# Patient Record
Sex: Female | Born: 1955 | Hispanic: No | Marital: Single | Smoking: Never smoker
Health system: Southern US, Community
[De-identification: ages and names within clinical notes are randomized; demographics above are authoritative.]

## PROBLEM LIST (undated history)

## (undated) DIAGNOSIS — E785 Hyperlipidemia, unspecified: Secondary | ICD-10-CM

## (undated) DIAGNOSIS — F419 Anxiety disorder, unspecified: Secondary | ICD-10-CM

## (undated) DIAGNOSIS — C4491 Basal cell carcinoma of skin, unspecified: Secondary | ICD-10-CM

## (undated) DIAGNOSIS — I10 Essential (primary) hypertension: Secondary | ICD-10-CM

## (undated) DIAGNOSIS — N6019 Diffuse cystic mastopathy of unspecified breast: Secondary | ICD-10-CM

## (undated) DIAGNOSIS — R011 Cardiac murmur, unspecified: Secondary | ICD-10-CM

## (undated) DIAGNOSIS — K219 Gastro-esophageal reflux disease without esophagitis: Secondary | ICD-10-CM

## (undated) HISTORY — PX: INGUINAL HERNIA REPAIR: SUR1180

## (undated) HISTORY — DX: Anxiety disorder, unspecified: F41.9

## (undated) HISTORY — PX: COLONOSCOPY: SHX174

## (undated) HISTORY — DX: Essential (primary) hypertension: I10

## (undated) HISTORY — PX: TONSILLECTOMY: SUR1361

## (undated) HISTORY — DX: Hemochromatosis, unspecified: E83.119

## (undated) HISTORY — DX: Gilbert syndrome: E80.4

## (undated) HISTORY — DX: Diffuse cystic mastopathy of unspecified breast: N60.19

## (undated) HISTORY — DX: Hyperlipidemia, unspecified: E78.5

## (undated) HISTORY — PX: DILATION AND CURETTAGE OF UTERUS: SHX78

## (undated) HISTORY — DX: Basal cell carcinoma of skin, unspecified: C44.91

## (undated) HISTORY — DX: Cardiac murmur, unspecified: R01.1

## (undated) HISTORY — PX: BREAST BIOPSY: SHX20

## (undated) HISTORY — PX: WISDOM TOOTH EXTRACTION: SHX21

## (undated) HISTORY — DX: Gastro-esophageal reflux disease without esophagitis: K21.9

---

## 2001-06-05 ENCOUNTER — Encounter: Payer: Self-pay | Admitting: Family Medicine

## 2001-06-05 ENCOUNTER — Encounter: Admission: RE | Admit: 2001-06-05 | Discharge: 2001-06-05 | Payer: Self-pay | Admitting: Family Medicine

## 2001-06-19 ENCOUNTER — Encounter: Admission: RE | Admit: 2001-06-19 | Discharge: 2001-06-19 | Payer: Self-pay | Admitting: *Deleted

## 2001-07-19 HISTORY — PX: OTHER SURGICAL HISTORY: SHX169

## 2004-09-04 ENCOUNTER — Ambulatory Visit: Payer: Self-pay | Admitting: Sports Medicine

## 2005-05-19 ENCOUNTER — Ambulatory Visit: Payer: Self-pay | Admitting: Internal Medicine

## 2005-05-27 ENCOUNTER — Ambulatory Visit: Payer: Self-pay | Admitting: Internal Medicine

## 2006-05-20 ENCOUNTER — Ambulatory Visit: Payer: Self-pay | Admitting: Internal Medicine

## 2006-05-20 LAB — CONVERTED CEMR LAB
ALT: 12 units/L (ref 0–40)
AST: 18 units/L (ref 0–37)
Basophils Absolute: 0 10*3/uL (ref 0.0–0.1)
Basophils Relative: 0.2 % (ref 0.0–1.0)
Chol/HDL Ratio, serum: 3.8
Cholesterol: 197 mg/dL (ref 0–200)
Eosinophil percent: 1.4 % (ref 0.0–5.0)
Glucose, Bld: 101 mg/dL — ABNORMAL HIGH (ref 70–99)
HCT: 45.7 % (ref 36.0–46.0)
HDL: 51.6 mg/dL (ref >39.0)
Hemoglobin: 15.6 g/dL — ABNORMAL HIGH (ref 12.0–15.0)
Hgb A1c MFr Bld: 5.4 % (ref 4.6–6.0)
LDL Cholesterol: 136 mg/dL — ABNORMAL HIGH (ref 0–99)
Lymphocytes Relative: 28.7 % (ref 12.0–46.0)
MCHC: 34.2 g/dL (ref 30.0–36.0)
MCV: 94.9 fL (ref 78.0–100.0)
Monocytes Absolute: 0.6 10*3/uL (ref 0.2–0.7)
Monocytes Relative: 9 % (ref 3.0–11.0)
Neutro Abs: 3.8 10*3/uL (ref 1.4–7.7)
Neutrophils Relative %: 60.7 % (ref 43.0–77.0)
Platelets: 295 10*3/uL (ref 150–400)
RBC: 4.82 M/uL (ref 3.87–5.11)
RDW: 11.5 % (ref 11.5–14.6)
TSH: 2.84 microintl units/mL (ref 0.35–5.50)
Triglyceride fasting, serum: 49 mg/dL (ref 0–149)
VLDL: 10 mg/dL (ref 0–40)
WBC: 6.3 10*3/uL (ref 4.5–10.5)

## 2006-06-01 ENCOUNTER — Ambulatory Visit: Payer: Self-pay | Admitting: Internal Medicine

## 2006-10-14 ENCOUNTER — Ambulatory Visit: Payer: Self-pay | Admitting: Internal Medicine

## 2006-10-14 LAB — CONVERTED CEMR LAB
ALT: 15 U/L (ref 0–40)
AST: 19 U/L (ref 0–37)
Cholesterol: 158 mg/dL (ref 0–200)
HDL: 54.8 mg/dL (ref >39.0)
LDL Cholesterol: 94 mg/dL (ref 0–99)
Total CHOL/HDL Ratio: 2.9
Triglycerides: 44 mg/dL (ref 0–149)
VLDL: 9 mg/dL (ref 0–40)

## 2006-11-14 ENCOUNTER — Ambulatory Visit: Payer: Self-pay | Admitting: Internal Medicine

## 2008-08-01 ENCOUNTER — Ambulatory Visit: Payer: Self-pay | Admitting: Internal Medicine

## 2008-08-01 DIAGNOSIS — N951 Menopausal and female climacteric states: Secondary | ICD-10-CM | POA: Insufficient documentation

## 2008-08-01 DIAGNOSIS — E785 Hyperlipidemia, unspecified: Secondary | ICD-10-CM

## 2008-08-01 DIAGNOSIS — Z87898 Personal history of other specified conditions: Secondary | ICD-10-CM | POA: Insufficient documentation

## 2008-08-01 DIAGNOSIS — Z9889 Other specified postprocedural states: Secondary | ICD-10-CM | POA: Insufficient documentation

## 2008-08-01 DIAGNOSIS — Z9089 Acquired absence of other organs: Secondary | ICD-10-CM | POA: Insufficient documentation

## 2008-08-09 ENCOUNTER — Encounter: Admission: RE | Admit: 2008-08-09 | Discharge: 2008-08-09 | Payer: Self-pay | Admitting: Internal Medicine

## 2008-08-13 ENCOUNTER — Telehealth (INDEPENDENT_AMBULATORY_CARE_PROVIDER_SITE_OTHER): Payer: Self-pay | Admitting: *Deleted

## 2008-08-14 ENCOUNTER — Encounter (INDEPENDENT_AMBULATORY_CARE_PROVIDER_SITE_OTHER): Payer: Self-pay | Admitting: *Deleted

## 2008-08-28 ENCOUNTER — Ambulatory Visit: Payer: Self-pay | Admitting: Internal Medicine

## 2008-08-28 LAB — CONVERTED CEMR LAB
OCCULT 1: NEGATIVE
OCCULT 2: NEGATIVE
OCCULT 3: NEGATIVE

## 2008-08-29 ENCOUNTER — Encounter (INDEPENDENT_AMBULATORY_CARE_PROVIDER_SITE_OTHER): Payer: Self-pay | Admitting: *Deleted

## 2008-09-19 ENCOUNTER — Ambulatory Visit: Payer: Self-pay | Admitting: Internal Medicine

## 2008-09-19 DIAGNOSIS — J309 Allergic rhinitis, unspecified: Secondary | ICD-10-CM | POA: Insufficient documentation

## 2008-10-29 ENCOUNTER — Ambulatory Visit: Payer: Self-pay | Admitting: Internal Medicine

## 2008-10-30 ENCOUNTER — Ambulatory Visit: Payer: Self-pay | Admitting: Obstetrics and Gynecology

## 2008-10-30 ENCOUNTER — Encounter: Payer: Self-pay | Admitting: Obstetrics and Gynecology

## 2008-10-30 ENCOUNTER — Other Ambulatory Visit: Admission: RE | Admit: 2008-10-30 | Discharge: 2008-10-30 | Payer: Self-pay | Admitting: Obstetrics and Gynecology

## 2008-11-06 LAB — CONVERTED CEMR LAB
Alkaline Phosphatase: 60 units/L (ref 39–117)
Bilirubin, Direct: 0.1 mg/dL (ref 0.0–0.3)
Cholesterol: 186 mg/dL (ref 0–200)
LDL Cholesterol: 118 mg/dL — ABNORMAL HIGH (ref 0–99)
Total Bilirubin: 1.1 mg/dL (ref 0.3–1.2)
Total CHOL/HDL Ratio: 3
Total Protein: 6.8 g/dL (ref 6.0–8.3)
VLDL: 8 mg/dL (ref 0.0–40.0)

## 2008-11-07 ENCOUNTER — Telehealth (INDEPENDENT_AMBULATORY_CARE_PROVIDER_SITE_OTHER): Payer: Self-pay | Admitting: *Deleted

## 2008-11-07 ENCOUNTER — Encounter (INDEPENDENT_AMBULATORY_CARE_PROVIDER_SITE_OTHER): Payer: Self-pay | Admitting: *Deleted

## 2008-11-19 ENCOUNTER — Ambulatory Visit: Payer: Self-pay | Admitting: Internal Medicine

## 2008-11-19 DIAGNOSIS — M5030 Other cervical disc degeneration, unspecified cervical region: Secondary | ICD-10-CM

## 2008-11-19 LAB — CONVERTED CEMR LAB
HDL goal, serum: 50 mg/dL
LDL Goal: 100 mg/dL

## 2008-11-25 ENCOUNTER — Encounter: Payer: Self-pay | Admitting: Internal Medicine

## 2008-11-25 ENCOUNTER — Ambulatory Visit: Payer: Self-pay | Admitting: Family Medicine

## 2008-12-17 ENCOUNTER — Encounter (INDEPENDENT_AMBULATORY_CARE_PROVIDER_SITE_OTHER): Payer: Self-pay | Admitting: *Deleted

## 2008-12-25 ENCOUNTER — Ambulatory Visit: Payer: Self-pay | Admitting: Internal Medicine

## 2008-12-25 DIAGNOSIS — F411 Generalized anxiety disorder: Secondary | ICD-10-CM | POA: Insufficient documentation

## 2008-12-26 ENCOUNTER — Telehealth (INDEPENDENT_AMBULATORY_CARE_PROVIDER_SITE_OTHER): Payer: Self-pay | Admitting: *Deleted

## 2008-12-27 ENCOUNTER — Telehealth (INDEPENDENT_AMBULATORY_CARE_PROVIDER_SITE_OTHER): Payer: Self-pay | Admitting: *Deleted

## 2008-12-30 ENCOUNTER — Telehealth (INDEPENDENT_AMBULATORY_CARE_PROVIDER_SITE_OTHER): Payer: Self-pay | Admitting: *Deleted

## 2009-01-10 ENCOUNTER — Telehealth (INDEPENDENT_AMBULATORY_CARE_PROVIDER_SITE_OTHER): Payer: Self-pay | Admitting: *Deleted

## 2009-07-19 HISTORY — PX: OTHER SURGICAL HISTORY: SHX169

## 2009-08-28 ENCOUNTER — Encounter: Admission: RE | Admit: 2009-08-28 | Discharge: 2009-08-28 | Payer: Self-pay | Admitting: Obstetrics and Gynecology

## 2010-02-10 ENCOUNTER — Encounter (INDEPENDENT_AMBULATORY_CARE_PROVIDER_SITE_OTHER): Payer: Self-pay | Admitting: *Deleted

## 2010-02-10 ENCOUNTER — Ambulatory Visit: Payer: Self-pay | Admitting: Gastroenterology

## 2010-02-10 DIAGNOSIS — K589 Irritable bowel syndrome without diarrhea: Secondary | ICD-10-CM

## 2010-02-10 LAB — CONVERTED CEMR LAB: Tissue Transglutaminase Ab, IgA: 10 units (ref <20)

## 2010-02-11 ENCOUNTER — Ambulatory Visit: Payer: Self-pay | Admitting: Internal Medicine

## 2010-02-11 DIAGNOSIS — M5430 Sciatica, unspecified side: Secondary | ICD-10-CM | POA: Insufficient documentation

## 2010-02-11 DIAGNOSIS — R198 Other specified symptoms and signs involving the digestive system and abdomen: Secondary | ICD-10-CM | POA: Insufficient documentation

## 2010-02-12 ENCOUNTER — Telehealth (INDEPENDENT_AMBULATORY_CARE_PROVIDER_SITE_OTHER): Payer: Self-pay | Admitting: *Deleted

## 2010-02-13 DIAGNOSIS — R7989 Other specified abnormal findings of blood chemistry: Secondary | ICD-10-CM | POA: Insufficient documentation

## 2010-02-13 LAB — CONVERTED CEMR LAB
ALT: 15 units/L (ref 0–35)
Albumin: 4.6 g/dL (ref 3.5–5.2)
Alkaline Phosphatase: 70 units/L (ref 39–117)
Basophils Absolute: 0 10*3/uL (ref 0.0–0.1)
Bilirubin, Direct: 0.2 mg/dL (ref 0.0–0.3)
CO2: 31 meq/L (ref 19–32)
Calcium: 9.6 mg/dL (ref 8.4–10.5)
Chloride: 104 meq/L (ref 96–112)
Creatinine, Ser: 0.7 mg/dL (ref 0.4–1.2)
Eosinophils Relative: 1 % (ref 0.0–5.0)
Glucose, Bld: 103 mg/dL — ABNORMAL HIGH (ref 70–99)
HCT: 43.6 % (ref 36.0–46.0)
Hemoglobin: 15.4 g/dL — ABNORMAL HIGH (ref 12.0–15.0)
IgA: 171 mg/dL (ref 68–378)
Lipase: 15 units/L (ref 11.0–59.0)
Lymphocytes Relative: 34.4 % (ref 12.0–46.0)
Lymphs Abs: 1.8 10*3/uL (ref 0.7–4.0)
Monocytes Relative: 9.2 % (ref 3.0–12.0)
Neutro Abs: 2.9 10*3/uL (ref 1.4–7.7)
RDW: 11.7 % (ref 11.5–14.6)
Saturation Ratios: 68.8 % — ABNORMAL HIGH (ref 20.0–50.0)
Sed Rate: 5 mm/hr (ref 0–22)
Sodium: 142 meq/L (ref 135–145)
Total Protein: 7.1 g/dL (ref 6.0–8.3)
Transferrin: 207.5 mg/dL — ABNORMAL LOW (ref 212.0–360.0)
WBC: 5.4 10*3/uL (ref 4.5–10.5)

## 2010-02-18 ENCOUNTER — Other Ambulatory Visit: Admission: RE | Admit: 2010-02-18 | Discharge: 2010-02-18 | Payer: Self-pay | Admitting: Obstetrics and Gynecology

## 2010-02-18 ENCOUNTER — Ambulatory Visit: Payer: Self-pay | Admitting: Gastroenterology

## 2010-02-18 ENCOUNTER — Ambulatory Visit: Payer: Self-pay | Admitting: Obstetrics and Gynecology

## 2010-02-19 ENCOUNTER — Telehealth (INDEPENDENT_AMBULATORY_CARE_PROVIDER_SITE_OTHER): Payer: Self-pay | Admitting: *Deleted

## 2010-03-04 ENCOUNTER — Ambulatory Visit: Payer: Self-pay | Admitting: Internal Medicine

## 2010-03-04 DIAGNOSIS — K5289 Other specified noninfective gastroenteritis and colitis: Secondary | ICD-10-CM

## 2010-03-05 ENCOUNTER — Telehealth: Payer: Self-pay | Admitting: Internal Medicine

## 2010-03-16 ENCOUNTER — Ambulatory Visit: Payer: Self-pay | Admitting: Gastroenterology

## 2010-03-18 ENCOUNTER — Encounter: Payer: Self-pay | Admitting: Gastroenterology

## 2010-03-19 ENCOUNTER — Telehealth: Payer: Self-pay | Admitting: Gastroenterology

## 2010-03-20 ENCOUNTER — Ambulatory Visit: Payer: Self-pay | Admitting: Gastroenterology

## 2010-05-20 ENCOUNTER — Ambulatory Visit: Payer: Self-pay | Admitting: Internal Medicine

## 2010-05-20 DIAGNOSIS — R21 Rash and other nonspecific skin eruption: Secondary | ICD-10-CM

## 2010-07-19 HISTORY — PX: MOHS SURGERY: SUR867

## 2010-08-05 ENCOUNTER — Ambulatory Visit
Admission: RE | Admit: 2010-08-05 | Discharge: 2010-08-05 | Payer: Self-pay | Source: Home / Self Care | Attending: Sports Medicine | Admitting: Sports Medicine

## 2010-08-05 DIAGNOSIS — M25579 Pain in unspecified ankle and joints of unspecified foot: Secondary | ICD-10-CM | POA: Insufficient documentation

## 2010-08-09 ENCOUNTER — Encounter: Payer: Self-pay | Admitting: Internal Medicine

## 2010-08-16 LAB — CONVERTED CEMR LAB
ALT: 23 units/L (ref 0–35)
AST: 29 units/L (ref 0–37)
Albumin: 4.6 g/dL (ref 3.5–5.2)
Alkaline Phosphatase: 61 units/L (ref 39–117)
BUN: 14 mg/dL (ref 6–23)
Basophils Absolute: 0 10*3/uL (ref 0.0–0.1)
Basophils Relative: 0.3 % (ref 0.0–3.0)
Bilirubin, Direct: 0.1 mg/dL (ref 0.0–0.3)
CO2: 31 meq/L (ref 19–32)
Calcium: 9.6 mg/dL (ref 8.4–10.5)
Chloride: 103 meq/L (ref 96–112)
Cholesterol, target level: 200 mg/dL
Cholesterol: 215 mg/dL (ref 0–200)
Creatinine, Ser: 0.9 mg/dL (ref 0.4–1.2)
Direct LDL: 131.8 mg/dL
Eosinophils Absolute: 0.1 10*3/uL (ref 0.0–0.7)
Eosinophils Relative: 1.7 % (ref 0.0–5.0)
GFR calc Af Amer: 84 mL/min
GFR calc non Af Amer: 69 mL/min
Glucose, Bld: 101 mg/dL — ABNORMAL HIGH (ref 70–99)
HCT: 44.3 % (ref 36.0–46.0)
HDL goal, serum: 50 mg/dL
HDL: 68 mg/dL (ref >39.0)
Hemoglobin: 15.5 g/dL — ABNORMAL HIGH (ref 12.0–15.0)
LDL Goal: 100 mg/dL
Lymphocytes Relative: 32.7 % (ref 12.0–46.0)
MCHC: 35 g/dL (ref 30.0–36.0)
MCV: 96.2 fL (ref 78.0–100.0)
Monocytes Absolute: 0.5 10*3/uL (ref 0.1–1.0)
Monocytes Relative: 9.6 % (ref 3.0–12.0)
Neutro Abs: 3.1 10*3/uL (ref 1.4–7.7)
Neutrophils Relative %: 55.7 % (ref 43.0–77.0)
Platelets: 220 10*3/uL (ref 150–400)
Potassium: 4.5 meq/L (ref 3.5–5.1)
RBC: 4.61 M/uL (ref 3.87–5.11)
RDW: 11.4 % — ABNORMAL LOW (ref 11.5–14.6)
Sodium: 141 meq/L (ref 135–145)
TSH: 2.41 microintl units/mL (ref 0.35–5.50)
Total Bilirubin: 1.2 mg/dL (ref 0.3–1.2)
Total CHOL/HDL Ratio: 3
Total CHOL/HDL Ratio: 3.2
Total Protein: 7 g/dL (ref 6.0–8.3)
Triglycerides: 53 mg/dL (ref 0–149)
VLDL: 11 mg/dL (ref 0–40)
WBC: 5.5 10*3/uL (ref 4.5–10.5)

## 2010-08-17 ENCOUNTER — Telehealth: Payer: Self-pay | Admitting: Gastroenterology

## 2010-08-18 NOTE — Assessment & Plan Note (Signed)
Summary: RECTAL BURNING AFTER BM...AS.   History of Present Illness Visit Type: new patient  Primary GI MD: Verl Blalock MD [place] Primary Provider: Unice Cobble, MD  Requesting Provider: na Chief Complaint: Rectal pain, change in bowel habits, loss of appetite, nausea, and bloating  History of Present Illness:   55 year old Caucasian female patient of Dr. Unice Cobble who presents with several months of burning discomfort in the right lower quadrant and rectal area with associated increased generalized stress over the death of her boyfriend. She has regular bowel movements and denies melena or hematochezia. There is some question as to whether or not in the past she has had gluten intolerance. She denies anorexia, weight loss, skin rashes, joint pains, oral stomatitis, or any history of anemia, hepatobiliary problems, or pancreatitis. She has not had previous colonoscopy which has been recommended. She denies upper gastrointestinal symptoms or antacid use. Should the past has had reactions to amoxicillin. Family history is noncontributory.   GI Review of Systems    Reports bloating, loss of appetite, and  nausea.      Denies abdominal pain, acid reflux, belching, chest pain, dysphagia with liquids, dysphagia with solids, heartburn, vomiting, vomiting blood, weight loss, and  weight gain.      Reports change in bowel habits and  rectal pain.     Denies anal fissure, black tarry stools, constipation, diarrhea, diverticulosis, fecal incontinence, heme positive stool, hemorrhoids, irritable bowel syndrome, jaundice, light color stool, liver problems, and  rectal bleeding.    Current Medications (verified): 1)  None  Allergies (verified): 1)  ! Amoxicillin  Past History:  Past medical, surgical, family and social histories (including risk factors) reviewed for relevance to current acute and chronic problems.  Past Medical History: Fibrocystic breast disease; Gilbert's Syndrome;  G 0 P 0 Hyperlipidemia ,LDL goal = <85 Anxiety Disorder  Past Surgical History: Reviewed history from 08/01/2008 and no changes required. Tracer pellets in breast 2003,[facility] for F/C breast disease monitor following breast biopsy (? bilat biopsy) Inguinal herniorrhaphy Tonsillectomy D&C ,Dr Warnell Forester 06/2007  Family History: Reviewed history from 08/01/2008 and no changes required. Father: LIVING Mother: LIVING, DJD,HTN Siblings: 1 BROTHER Family History Hypertension: M, MGM MGM Alsheimer's No FH of Colon Cancer:  Social History: Reviewed history from 08/01/2008 and no changes required. Occupation: Event organiser Never Smoked Alcohol use-yes: 3 daily  Regular exercise-yes  Review of Systems       The patient complains of anxiety-new, depression-new, fatigue, night sweats, and sleeping problems.  The patient denies allergy/sinus, anemia, arthritis/joint pain, back pain, blood in urine, breast changes/lumps, change in vision, confusion, cough, coughing up blood, fainting, fever, headaches-new, hearing problems, heart murmur, heart rhythm changes, itching, menstrual pain, muscle pains/cramps, nosebleeds, pregnancy symptoms, shortness of breath, skin rash, sore throat, swelling of feet/legs, swollen lymph glands, thirst - excessive , urination - excessive , urination changes/pain, urine leakage, vision changes, and voice change.    Vital Signs:  Patient profile:   55 year old female Height:      67 inches Weight:      133 pounds BMI:     20.91 BSA:     1.70 Pulse rate:   60 / minute Pulse rhythm:   regular BP sitting:   132 / 76  (left arm) Cuff size:   regular  Vitals Entered By: Hope Pigeon CMA (February 10, 2010 10:03 AM)  Physical Exam  General:  Well developed, well nourished, no acute distress.healthy appearing.   Head:  Normocephalic and atraumatic. Eyes:  PERRLA, no icterus.exam deferred to patient's ophthalmologist.   Neck:  Supple; no masses or  thyromegaly. Lungs:  Clear throughout to auscultation. Heart:  Regular rate and rhythm; no murmurs, rubs,  or bruits. Abdomen:  Soft, nontender and nondistended. No masses, hepatosplenomegaly or hernias noted. Normal bowel sounds. Rectal:  deferred until time of colonoscopy.   Pulses:  Normal pulses noted. Extremities:  No clubbing, cyanosis, edema or deformities noted. Neurologic:  Alert and  oriented x4;  grossly normal neurologically. Cervical Nodes:  No significant cervical adenopathy. Psych:  Alert and cooperative. Normal mood and affect.   Impression & Recommendations:  Problem # 1:  IBS (ICD-564.1) Assessment Deteriorated Colonoscopy has been scheduled to exclude inflammatory bowel disease. Screening labs and celiac serologies also ordered. Her GI complaints appear to be directly related to anxiety syndrome, but she apparently has not tolerated anti-anxiety her antidepressant medication in the past. We will try p.r.n. sublingual Levsin as tolerated along with general IBS dietary adjustments. She denies lactose intolerance or history of sorbitol or fructose ingestion. She may benefit from a trial of probiotic therapy. TLB-CBC Platelet - w/Differential (85025-CBCD) TLB-BMP (Basic Metabolic Panel-BMET) (16109-UEAVWUJ) TLB-Hepatic/Liver Function Pnl (80076-HEPATIC) TLB-TSH (Thyroid Stimulating Hormone) (84443-TSH) TLB-B12, Serum-Total ONLY (81191-Y78) TLB-Ferritin (29562-ZHY) TLB-Folic Acid (Folate) (86578-ION) TLB-IBC Pnl (Iron/FE;Transferrin) (83550-IBC) TLB-Amylase (82150-AMYL) TLB-Lipase (83690-LIPASE) TLB-IgA (Immunoglobulin A) (82784-IGA) TLB-Sedimentation Rate (ESR) (85652-ESR) T-Sprue Panel (Celiac Disease Aby Eval) (83516x3/[address])  Problem # 2:  ANXIETY DISORDER (ICD-300.00) Assessment: Deteriorated Her boyfriend Dr. Iver Nestle apparently expired in April of this year. She has had anxiety and depression problems related to this loss. She denies any symptoms of  psychosis.  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 Unice Cobble next week for possible hormonal initiation.  Patient Instructions: 1)  Please go to the basement for lab work. 2)  Begin Levsin as needed. 3)  You are scheduled for a colonoscopy. 4)  The medication list was reviewed and reconciled.  All changed / newly prescribed medications were explained.  A complete medication list was provided to the patient / caregiver. 5)  Copy sent to : Dr. Unice Cobble 6)  Please continue current medications.  7)  Colonoscopy and Flexible Sigmoidoscopy brochure given.  8)  Conscious Sedation brochure given.  9)  IBS brochure given.  Prescriptions: LEVSIN/SL 0.125 MG  SUBL (HYOSCYAMINE SULFATE) 1 SL q 4-6 hrs as needed  #60 x 3   Entered by:   Alberteen Spindle RN   Authorized by:   Sable Feil MD [facility]   Signed by:   Alberteen Spindle RN on 02/10/2010   Method used:   Electronically to        [facility]. #[id number]* (retail)       [place], [place]  [address]       Ph: [phone]       Fax: [phone]   RxID:   [id number]   Appended Document: RECTAL BURNING AFTER BM...AS.    Clinical Lists Changes  Medications: Added new medication of MOVIPREP 100 GM  SOLR (PEG-KCL-NACL-NASULF-NA ASC-C) As per prep instructions. - Signed Rx of MOVIPREP 100 GM  SOLR (PEG-KCL-NACL-NASULF-NA ASC-C) As per prep instructions.;  #1 x 0;  Signed;  Entered by: Alberteen Spindle RN;  Authorized by: Sable Feil MD  FACG;  Method used: Electronically to [facility]. #[id number]*, [address] [place], [place], [place]  [address], Ph: [phone], Fax: [phone] Orders: Added new Test order of Colonoscopy (Colon) - Signed    Prescriptions: MOVIPREP 100 GM  SOLR (PEG-KCL-NACL-NASULF-NA ASC-C) As per  prep instructions.  #1 x 0   Entered by:   Alberteen Spindle RN   Authorized by:   Sable Feil MD [facility]   Signed by:   Alberteen Spindle RN on 02/10/2010   Method used:   Electronically to        [facility]. #[id number]* (retail)       [place], [place]  [address]       Ph: [phone]       Fax: [phone]   RxID:   [id number]

## 2010-08-18 NOTE — Progress Notes (Signed)
Summary: diarrhea no better  Phone Note Call from Patient Call back at [phone]   Summary of Call: pt left VM that she has been on a liquid diet for 20 hour and has only had 1 normal BM that has since been follow by diarrhea. pt would like to know what she needs to do now and when can she resume regular diet. pls advise..............Marland KitchenFelecia Deloach CMA  March 05, 2010 11:26 AM   Follow-up for Phone Call        Patient notified and prescription faxed per request. Follow-up by: Lucious Groves CMA,  March 05, 2010 2:29 PM    New/Updated Medications: LONOX 2.5-0.025 MG TABS (DIPHENOXYLATE-ATROPINE) 1 as needed for frank diarrhea(watery BMs) Prescriptions: LONOX 2.5-0.025 MG TABS (DIPHENOXYLATE-ATROPINE) 1 as needed for frank diarrhea(watery BMs)  #10 x 0   Entered and Authorized by:   Marga Melnick MD   Signed by:   Marga Melnick MD on 03/05/2010   Method used:   Printed then faxed to ...       [facility] #[id number]* (retail)       [address] [place]       [place], [place]  [address]       Ph: [phone]       Fax: [phone]   RxID:   [id number]

## 2010-08-18 NOTE — Assessment & Plan Note (Signed)
Summary: FOR A SPIDER BITE//PH   Vital Signs:  Patient profile:   55 year old female Weight:      133.2 pounds BMI:     21.09 Temp:     98.8 degrees F oral Pulse rate:   76 / minute Resp:     15 per minute BP sitting:   104 / 68  (left arm) Cuff size:   large  Vitals Entered By: Georgette Dover CMA (May 20, 2010 1:32 PM) CC: Spider Bite-right upper leg   Primary Care Provider:  Unice Cobble, MD   CC:  Spider Bite-right upper leg.  History of Present Illness: Injury      This is a 55 year old woman who presents with An injury  to the right thigh, presumed to be a spider bite.  The patient  reports swelling, redness, tenderness, and increased warmth @ the site  approx 10 am after noting itching. Associated were chills , nausea & headache. No known vector ; but she was trimming hedges 10/30. She has a cat but no dogs.Rx: H2O2, baking soda paste. No PMH of MRSA  Current Medications (verified): 1)  None  Allergies: 1)  ! Amoxicillin  Review of Systems General:  Complains of sweats; denies fever. GI:  Denies abdominal pain, diarrhea, nausea, and vomiting.  Physical Exam  General:  well-nourished,in no acute distress; alert,appropriate and cooperative throughout examination Neck:  Supple Abdomen:  Bowel sounds positive,abdomen soft and non-tender without masses, organomegaly or hernias noted. Skin:  30X32 mm erythema R lateral thigh, bland  induration w/o increased temp or blanching .? punctate entry wound @ 7 o'clock on rash extent Cervical Nodes:  No lymphadenopathy noted Axillary Nodes:  No palpable lymphadenopathy Inguinal Nodes:  No significant adenopathy on R    Impression & Recommendations:  Problem # 1:  RASH-NONVESICULAR (ICD-782.1) probably from vector; spider bite not suggested  Complete Medication List: 1)  Doxycycline Hyclate 100 Mg Caps (Doxycycline hyclate) .Marland Kitchen.. 1 two times a day ; avoid direct sun  Patient Instructions: 1)  Avoid sun exposure while  on Doxycycline Prescriptions: DOXYCYCLINE HYCLATE 100 MG CAPS (DOXYCYCLINE HYCLATE) 1 two times a day ; avoid direct sun  #14 x 0   Entered and Authorized by:   Unice Cobble MD   Signed by:   Unice Cobble MD on 05/20/2010   Method used:   Faxed to ...       [facility] #[id number]* (retail)       [place], [place]  [address]       Ph: [phone]       Fax: [phone]   RxID:   [id number]    Orders Added: 1)  Est. Patient Level III [51884]

## 2010-08-18 NOTE — Procedures (Signed)
Summary: Colonoscopy  Patient: Hazelee Harbold Note: All result statuses are Final unless otherwise noted.  Tests: (1) Colonoscopy (COL)   COL Colonoscopy           [facility].     [place], [place]  [address]           COLONOSCOPY PROCEDURE REPORT           PATIENT:  Jillyn, Stacey  MR#:  [id number]     BIRTHDATE:  [birth year], 51 yrs. old  GENDER:  female     ENDOSCOPIST:  Loralee Pacas. Sharlett Iles, MD, [facility]     REF. BY:     PROCEDURE DATE:  03/16/2010     PROCEDURE:  Colonoscopy with biopsy     ASA CLASS:  Class II     INDICATIONS:  unexplained diarrhea hx of asymptomayic     HEMOCHROMATOSIS AND IBS.     MEDICATIONS:   Fentanyl 25 mcg IV, Versed 3 mg IV           DESCRIPTION OF PROCEDURE:   After the risks benefits and     alternatives of the procedure were thoroughly explained, informed     consent was obtained.  Digital rectal exam was performed and     revealed no abnormalities.   The LB [id number] endoscope     was introduced through the anus and advanced to the terminal ileum     which was intubated for a short distance, without limitations.     The quality of the prep was excellent, using MoviPrep.  The     instrument was then slowly withdrawn as the colon was fully     examined.     <<PROCEDUREIMAGES>>     FINDINGS:  Severe diverticulosis was found throughout the colon.     This was otherwise a normal examination of the colon. RANDOM     BIOPSIES DONE.   Retroflexed views in the rectum revealed no     abnormalities.    The scope was then withdrawn from the patient an     d the procedure completed.           COMPLICATIONS:  None     ENDOSCOPIC IMPRESSION:     1) Severe diverticulosis throughout the colon     2) Otherwise normal examination     R/O MICROSCOPIC/COLLAGENOUS COLITIS VS IBS. PREDOMINANTLY     DIARRHEA.     RECOMMENDATIONS:     1) Continue current colorectal screening recommendations for     "routine risk" patients with  a repeat colonoscopy in 10 years.     2) Await pathology results     3) Out patient follow-up in 2 weeks.     REPEAT EXAM:  No           ______________________________     Loralee Pacas. Sharlett Iles, MD, [facility]           CC:  Hendricks Limes, MD           n.     Lorrin MaisMarland Kitchen   Loralee Pacas. Patterson at 03/16/2010 09:32 AM           [name], [id number]  Note: An exclamation mark (!) indicates a result that was not dispersed into the flowsheet. Document Creation Date: 03/16/2010 9:34 AM _______________________________________________________________________  (1) Order result status: Final Collection or observation date-time: 03/16/2010 09:22 Requested date-time:  Receipt date-time:  Reported date-time:  Referring Physician:   Ordering Physician: Verl Blalock [phone]) Specimen Source:  Source: Tawanna Cooler Order Number: [id number] Lab site:   Appended Document: Colonoscopy     Procedures Next Due Date:    Colonoscopy: 03/2020

## 2010-08-18 NOTE — Progress Notes (Signed)
Summary: Vitamin C  Phone Note Call from Patient Call back at Home Phone [phone]   Summary of Call: Patient called stating that MD told her not to take any vitamins, Patient went to the dentist today and they made her aware that her gums are not as healthy as they should be and she needs to take Vit. C.  Per the patient she not taken the vit. c for a couple of months now. Please advise. Initial call taken by: Ernestene Mention CMA,  February 19, 2010 3:30 PM  Follow-up for Phone Call        Per Dr.Hopper less than 2028m daily   I left message on Voicemail informing patient of Dr.Hopper's response Follow-up by: CGeorgette DoverCMA,  February 19, 2010 4:23 PM

## 2010-08-18 NOTE — Assessment & Plan Note (Signed)
Summary: Discuss labs/dfs   History of Present Illness Visit Type: Follow-up Visit Primary GI MD: Verl Blalock MD [place] Primary Jaysie Benthall: Unice Cobble, MD  Requesting Tyanne Derocher: na Chief Complaint: Patient here to discuss Hemochromatosis labs, she denies any problems at this time.  History of Present Illness:   This patient is asymptomatic with her IBS. Labs have showed evidence of homozygote state for hemochromatosis. Liver function tests have been normal, and she has no history of pancreatic or cardiovascular disease. Family history is noncontributory. Her serum ferritin level is normal at 155, and iron saturation was 68%. Colonoscopy including random biopsies was normal.   GI Review of Systems      Denies abdominal pain, acid reflux, belching, bloating, chest pain, dysphagia with liquids, dysphagia with solids, heartburn, loss of appetite, nausea, vomiting, vomiting blood, weight loss, and  weight gain.        Denies anal fissure, black tarry stools, change in bowel habit, constipation, diarrhea, diverticulosis, fecal incontinence, heme positive stool, hemorrhoids, irritable bowel syndrome, jaundice, light color stool, liver problems, rectal bleeding, and  rectal pain.    Current Medications (verified): 1)  None  Allergies (verified): 1)  ! Amoxicillin  Past History:  Past medical, surgical, family and social histories (including risk factors) reviewed for relevance to current acute and chronic problems.  Past Medical History: Reviewed history from 02/11/2010 and no changes required. Fibrocystic breast disease; Gilbert's Syndrome; G 0 P 0 Hyperlipidemia : Framingham Study LDL goal = < 160. NMR 2006: LDL 124(5809/ 9833), HDL 49, TG 54. LDL goal = < 90 based on NMR Lipoprofile. Anxiety Disorder  Past Surgical History: Reviewed history from 02/11/2010 and no changes required. Tracer pellets in breast 2003,[facility] for F/C breast disease monitor following breast biopsy (?  bilateral breast  biopsies) Note : F/C breast improved off caffeine (prev drank 6 cups/ day) Inguinal herniorrhaphy Tonsillectomy D&C ,Dr Warnell Forester 06/2007; now seeing Dr Cherylann Banas  Family History: Reviewed history from 02/11/2010 and no changes required. Father: PMH of HTN Mother:  DJD,PMH of HTN, dyslipidemia Siblings: 1 BROTHER: negative MGM :Alsheimer's, HTN No FH of Colon Cancer:  Social History: Reviewed history from 02/11/2010 and no changes required. 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  The patient denies allergy/sinus, anemia, arthritis/joint pain, back pain, blood in urine, breast changes/lumps, change in vision, confusion, cough, coughing up blood, fainting, fatigue, fever, headaches-new, hearing problems, heart murmur, heart rhythm changes, itching, menstrual pain, muscle pains/cramps, nosebleeds, pregnancy symptoms, shortness of breath, skin rash, sore throat, swelling of feet/legs, swollen lymph glands, thirst - excessive , urination - excessive , urination changes/pain, urine leakage, vision changes, and voice change.    Vital Signs:  Patient profile:   55 year old female Height:      66.75 inches Weight:      132.6 pounds BMI:     21.00 Pulse rate:   60 / minute Pulse rhythm:   regular BP sitting:   140 / 88  (left arm) Cuff size:   regular  Vitals Entered By: Bernita Buffy CMA Deborra Medina) (March 20, 2010 11:23 AM)  Physical Exam  General:  Well developed, well nourished, no acute distress.healthy appearing.   Head:  Normocephalic and atraumatic. Eyes:  PERRLA, no icterus.exam deferred to patient's ophthalmologist.   Abdomen:  Soft, nontender and nondistended. No  masses, hepatosplenomegaly or hernias noted. Normal bowel sounds. Psych:  Alert and cooperative. Normal mood and  affect.   Impression & Recommendations:  Problem # 1:  IRON, SERUM, ELEVATED (ICD-790.6) Assessment Unchanged She has genetic hemochromatosis, homozygote. I have reviewed this diagnosis with the patient and we will check iron levels and liver tests every 6 months. I've asked her to take vitamin C from her multivitamins since this can cause even more hyperabsorption of dietary iron. She has no evidence of tissue damage from hemochromatosis.  Problem # 2:  CHANGE IN BOWELS (KXF-818.29) Assessment: Improved Continue p.r.n. Levsin for IBS symptomatology.  Problem # 3:  IBS (ICD-564.1) Assessment: Improved  Patient Instructions: 1)  You will need to have lab work drawn in 6 months.  this will be due in March 2012. 2)  Please continue current medications.  3)  The medication list was reviewed and reconciled.  All changed / newly prescribed medications were explained.  A complete medication list was provided to the patient / caregiver. 4)  Copy sent to : Dr. Unice Cobble.

## 2010-08-18 NOTE — Assessment & Plan Note (Signed)
Summary: crackling in head,cbs   Vital Signs:  Patient profile:   55 year old female Weight:      138 pounds Pulse rate:   64 / minute Resp:     18 per minute BP sitting:   138 / 86  (left arm) Cuff size:   regular  Vitals Entered By: Georgette Dover (Nov 19, 2008 2:70 PM) CC: 1.) Clicking in neck-? referral   2.) Right side(Face)muscle tightness x 5 years, worse recently , Lipid Management   CC:  1.) Clicking in neck-? referral   2.) Right side(Face)muscle tightness x 5 years, worse recently , and Lipid Management.  History of Present Illness: Cracking in neck since she fell cross country skiing(08/24/08). No pain , just "crunching  sounds with turning head or when supine". Better with hyperextension of neck over pillow.                                     She never took statin ; LDL decreased from 132 to 118, goal = < 100. LDL was 92 on low fat diet in 2007.                             Lipid Management History:      Negative NCEP/ATP III risk factors include female age less than 61 years old, no history of early menopause without estrogen hormone replacement, non-diabetic, no family history for ischemic heart disease, non-tobacco-user status, non-hypertensive, no ASHD (atherosclerotic heart disease), no prior stroke/TIA, no peripheral vascular disease, and no history of aortic aneurysm.     Allergies: 1)  ! Amoxicillin  Physical Exam  General:  well-nourished,in no acute distress; alert,appropriate and cooperative throughout examination Neck:  No deformities, masses, or tenderness noted. Thyroid slightly asymmetric & firm.Full ROM Extremities:  No clubbing, cyanosis, edema, or deformity noted  Neurologic:  alert & oriented X3, strength normal in all extremities, and DTRs symmetrical and normal.   Skin:  Intact without suspicious lesions or rashes   Impression & Recommendations:  Problem # 1:  DISC DISEASE, CERVICAL (ICD-722.4)  Problem # 2:  HYPERLIPIDEMIA  (JJK-093.4)  Complete Medication List: 1)  Lorazepam 0.5 Mg Tabs (Lorazepam) .Marland Kitchen.. 1 by mouth two times a day as needed 2)  Nasonex 50 Mcg/act Susp (Mometasone furoate) .... Two times a day as directed  Lipid Assessment/Plan:      Based on NCEP/ATP III, the patient's risk factor category is "0-1 risk factors".  The patient's lipid goals have been set as follows: Total cholesterol goal is 200; LDL cholesterol goal is 100; HDL cholesterol goal is 50; Triglyceride goal is 150.  Her LDL cholesterol goal has been met.    Patient Instructions: 1)  Glucosamine sulfate 1500 mg 3 months on & 2 months as needed. Sleep with cervical pillow. Consider a Red Rice Yeast Supplement ([employer] [place] in 09/2008). Your LDL goal = < 100.

## 2010-08-18 NOTE — Progress Notes (Signed)
Summary: ? re labs  Phone Note Call from Patient Call back at [phone]   Caller: Patient Call For: Dr Jarold Motto Reason for Call: Talk to Nurse Summary of Call: Patient has questions regarding lab work. Initial call taken by: Tawni Levy,  March 19, 2010 12:50 PM  Follow-up for Phone Call        LM for pt to call.  Lupita Leash Surface RN  March 19, 2010 1:05 PM  Pt would like to move up appt to discuss labs.  Will come in tomorrow.    Follow-up by: Ashok Cordia RN,  March 19, 2010 1:18 PM

## 2010-08-18 NOTE — Assessment & Plan Note (Signed)
Summary: CPX AND FASTING LABS///SPH   Vital Signs:  Patient profile:   55 year old female Height:      66.75 inches Weight:      134.2 pounds Temp:     98.4 degrees F oral Pulse rate:   56 / minute Resp:     14 per minute BP sitting:   118 / 64  (left arm) Cuff size:   regular  Vitals Entered By: Georgette Dover CMA (February 11, 2010 8:34 AM)    Primary Care Provider:  Unice Cobble, MD    History of Present Illness: Anita Schmidt is here for a physical; she has had some  bowel changes  for which she sees Dr Sharlett Iles. Colonoscopy scheduled for 03/18/2010.  Lipid Management History:      Positive NCEP/ATP III risk factors include female age 42 years old or older.  Negative NCEP/ATP III risk factors include no history of early menopause without estrogen hormone replacement, non-diabetic, no family history for ischemic heart disease, non-tobacco-user status, non-hypertensive, no ASHD (atherosclerotic heart disease), no prior stroke/TIA, no peripheral vascular disease, and no history of aortic aneurysm.     Current Medications (verified): 1)  Levsin/sl 0.125 Mg  Subl (Hyoscyamine Sulfate) .Marland Kitchen.. 1 Sl Q 4-6 Hrs As Needed 2)  Moviprep 100 Gm  Solr (Peg-Kcl-Nacl-Nasulf-Na Asc-C) .... As Per Prep Instructions.  Allergies: 1)  ! Amoxicillin  Past History:  Past Medical History: Fibrocystic breast disease; Gilbert's Syndrome; G 0 P 0 Hyperlipidemia : Framingham Study LDL goal = < 160. NMR 2006: LDL 466(5993/ 5701), HDL 49, TG 54. LDL goal = < 90 based on NMR Lipoprofile. Anxiety Disorder  Past Surgical History: Tracer pellets in breast 2003,[facility] for F/C breast disease monitor following breast biopsy (? bilateral breast  biopsies) Note : F/C breast improved off caffeine (prev drank 6 cups/ day) Inguinal herniorrhaphy Tonsillectomy D&C ,Dr Warnell Forester 06/2007; now seeing Dr Cherylann Banas  Family History: Father: PMH of HTN Mother:  DJD,PMH of HTN, dyslipidemia Siblings: 1 BROTHER: negative MGM  :Alsheimer's, HTN No FH of Colon Cancer:  Social History: Occupation: Public affairs consultant  Single Never Smoked Alcohol use-yes: 0-3 daily  Regular exercise-yes: walking once daily , yoga  Review of Systems General:  Complains of sweats; denies chills, fever, and weight loss. Eyes:  Denies blurring, double vision, and vision loss-both eyes; OD will have to be manually opened @ night occasionally . Neg Ophth exam  within 30 days. ENT:  Complains of postnasal drainage; denies difficulty swallowing and hoarseness; Some cough from PNDr. CV:  Denies chest pain or discomfort, leg cramps with exertion, palpitations, shortness of breath with exertion, swelling of feet, and swelling of hands. Resp:  Denies shortness of breath, sputum productive, and wheezing. GI:  Denies abdominal pain, bloody stools, dark tarry stools, diarrhea, and indigestion. GU:  Denies discharge, dysuria, and hematuria; Gyn appt next week. MS:  Denies joint pain, joint redness, joint swelling, low back pain, mid back pain, and thoracic pain. Derm:  Complains of hair loss; denies changes in nail beds, dryness, lesion(s), and rash. Neuro:  Denies disturbances in coordination and poor balance; Positional (with yoga) N&T in LLE . Sciatica LLE better with yoga & stretching. Psych:  Complains of anxiety; denies depression, easily angered, easily tearful, and irritability; Stress from concern about family  age/ health issues. Endo:  Denies cold intolerance, excessive hunger, excessive thirst, excessive urination, and heat intolerance. Heme:  Denies abnormal bruising and bleeding. Allergy:  Complains of sneezing; denies itching eyes; Sneezing in  certain plants.  Physical Exam  General:  Thin but well-nourished, alert,appropriate and cooperative throughout examination Head:  Normocephalic and atraumatic without obvious abnormalities. No apparent alopecia  Eyes:  No corneal or conjunctival inflammation noted. EOMI. Perrla. Funduscopic  exam benign, without hemorrhages, exudates or papilledema.Field of  Vision grossly normal. Minimal OD ptosis Ears:  External ear exam shows no significant lesions or deformities.  Otoscopic examination reveals clear canals, tympanic membranes are intact bilaterally without bulging, retraction, inflammation or discharge. Hearing is grossly normal bilaterally. Nose:  External nasal examination shows no deformity or inflammation. Nasal mucosa are pink and moist without lesions or exudates. Mouth:  Oral mucosa and oropharynx without lesions or exudates.  Teeth in good repair. Neck:  No deformities, masses, or tenderness noted. Lungs:  Normal respiratory effort, chest expands symmetrically. Lungs are clear to auscultation, no crackles or wheezes. Heart:  normal rate, regular rhythm, no gallop, no rub, no JVD, no HJR, and grade 1/2-1  /6  R base  systolic murmur.   Abdomen:  Bowel sounds positive,abdomen soft and non-tender without masses, organomegaly or hernias noted. Aorta palpable w/o AAA Genitalia:  Dr Cherylann Banas Msk:  No deformity or scoliosis noted of thoracic or lumbar spine.   Pulses:  R and L carotid,radial,dorsalis pedis and posterior tibial pulses are full and equal bilaterally Extremities:  No clubbing, cyanosis, edema, or deformity noted with normal full range of motion of all joints.   Neg SLR past 90 degrees Neurologic:  alert & oriented X3, cranial nerves II-XII intact, strength normal in all extremities, sensation intact to light touch, and DTRs symmetrical and normal.   Skin:  Intact without suspicious lesions or rashes Cervical Nodes:  No lymphadenopathy noted Axillary Nodes:  No palpable lymphadenopathy Psych:  memory intact for recent and remote, normally interactive, good eye contact, and not anxious appearing.     Impression & Recommendations:  Problem # 1:  ROUTINE GENERAL MEDICAL EXAM@HEALTH  CARE FACL (ICD-V70.0)  Orders: EKG w/ Interpretation (93000) Venipuncture  (17510) TLB-Lipid Panel (80061-LIPID)  Problem # 2:  CHANGE IN BOWELS (ICD-787.99) Loose bowels  Problem # 3:  HYPERLIPIDEMIA (ICD-272.4)  Orders: Venipuncture (25852) TLB-Lipid Panel (80061-LIPID)  Problem # 4:  SCIATICA, LEFT (ICD-724.3) neg N-M exam  Complete Medication List: 1)  Levsin/sl 0.125 Mg Subl (Hyoscyamine sulfate) .Marland Kitchen.. 1 sl q 4-6 hrs as needed 2)  Moviprep 100 Gm Solr (Peg-kcl-nacl-nasulf-na asc-c) .... As per prep instructions.  Lipid Assessment/Plan:      Based on NCEP/ATP III, the patient's risk factor category is "0-1 risk factors".  The patient's lipid goals are as follows: Total cholesterol goal is 200; LDL cholesterol goal is 100; HDL cholesterol goal is 50; Triglyceride goal is 150.  Her LDL cholesterol goal has been met.    Patient Instructions: 1)  Align once daily until bowels normal. Up to 9 servings/ day of fruits or vegetables / day. Avoid supplemental iron.    Appended Document: CPX AND FASTING LABS///SPH

## 2010-08-18 NOTE — Letter (Signed)
Summary: [facility] Instructions  [facility]  [address] [place], [place] [address]   Phone: [phone]  Fax: [phone]       Anita Schmidt    1956-07-17    MRN: [id number]        Procedure Day Dorna Bloom: Wednesday, 03/18/10     Arrival Time: 9:30      Procedure Time: 10:30     Location of Procedure:                    _ X_  [facility] (4th Floor)                        PREPARATION FOR COLONOSCOPY WITH MOVIPREP   Starting 5 days prior to your procedure 03/13/10 do not eat nuts, seeds, popcorn, corn, beans, peas,  salads, or any raw vegetables.  Do not take any fiber supplements (e.g. Metamucil, Citrucel, and Benefiber).  THE DAY BEFORE YOUR PROCEDURE         DATE: 03/17/10    DAY: Tuesday  1.  Drink clear liquids the entire day-NO SOLID FOOD  2.  Do not drink anything colored red or purple.  Avoid juices with pulp.  No orange juice.  3.  Drink at least 64 oz. (8 glasses) of fluid/clear liquids during the day to prevent dehydration and help the prep work efficiently.  CLEAR LIQUIDS INCLUDE: Water Jello Ice Popsicles Tea (sugar ok, no milk/cream) Powdered fruit flavored drinks Coffee (sugar ok, no milk/cream) Gatorade Juice: apple, white grape, white cranberry  Lemonade Clear bullion, consomm, broth Carbonated beverages (any kind) Strained chicken noodle soup Hard Candy                             4.  In the morning, mix first dose of MoviPrep solution:    Empty 1 Pouch A and 1 Pouch B into the disposable container    Add lukewarm drinking water to the top line of the container. Mix to dissolve    Refrigerate (mixed solution should be used within 24 hrs)  5.  Begin drinking the prep at 5:00 p.m. The MoviPrep container is divided by 4 marks.   Every 15 minutes drink the solution down to the next mark (approximately 8 oz) until the full liter is complete.   6.  Follow completed prep with 16 oz of clear liquid of your choice (Nothing  red or purple).  Continue to drink clear liquids until bedtime.  7.  Before going to bed, mix second dose of MoviPrep solution:    Empty 1 Pouch A and 1 Pouch B into the disposable container    Add lukewarm drinking water to the top line of the container. Mix to dissolve    Refrigerate  THE DAY OF YOUR PROCEDURE      DATE: 03/18/10    DAY: Wednesday  Beginning at 5:30 a.m. (5 hours before procedure):         1. Every 15 minutes, drink the solution down to the next mark (approx 8 oz) until the full liter is complete.  2. Follow completed prep with 16 oz. of clear liquid of your choice.    3. You may drink clear liquids until 8:30 (2 HOURS BEFORE PROCEDURE).   MEDICATION INSTRUCTIONS  Unless otherwise instructed, you should take regular prescription medications with a small sip of water   as early as possible  the morning of your procedure.                  OTHER INSTRUCTIONS  You will need a responsible adult at least 57 years of age to accompany you and drive you home.   This person must remain in the waiting room during your procedure.  Wear loose fitting clothing that is easily removed.  Leave jewelry and other valuables at home.  However, you may wish to bring a book to read or  an iPod/MP3 player to listen to music as you wait for your procedure to start.  Remove all body piercing jewelry and leave at home.  Total time from sign-in until discharge is approximately 2-3 hours.  You should go home directly after your procedure and rest.  You can resume normal activities the  day after your procedure.  The day of your procedure you should not:   Drive   Make legal decisions   Operate machinery   Drink alcohol   Return to work  You will receive specific instructions about eating, activities and medications before you leave.    The above instructions have been reviewed and explained to me by   _______________________    I fully understand and can  verbalize these instructions _____________________________ Date _________

## 2010-08-18 NOTE — Letter (Signed)
Summary: Patient Notice- Colon Biospy Results  [facility]  [address] [place], [place] [address]   Phone: [phone]  Fax: [phone]        March 18, 2010 MRN: [id number]    Anita Schmidt [address] [place], [place]  [address]    Dear Ms. [name],  I am pleased to inform you that the biopsies taken during your recent colonoscopy did not show any evidence of cancer upon pathologic examination.  Additional information/recommendations:  __No further action is needed at this time.  Please follow-up with      your primary care physician for your other healthcare needs.  __Please call [phone] to schedule a return visit to review      your condition.  _X_Continue with the treatment plan as outlined on the day of your      exam.PLEASE KEEP OFFICE APPOINTMENT...COLON BIOPSIES DO NOT SHOW COLITIS.  __You should have a repeat colonoscopy examination for this problem           in _ years.  Please call us if you are having persistent problems or have questions about your condition that have not been fully answered at this time.  Sincerely,  Mardella Layman MD [facility]   This letter has been electronically signed by your physician.  Appended Document: Patient Notice- Colon Biospy Results letter mailed 9.2.11

## 2010-08-18 NOTE — Assessment & Plan Note (Signed)
Summary: STOMACH ISSUE/KN   Vital Signs:  Patient profile:   55 year old female Height:      66.75 inches (169.55 cm) Weight:      134.13 pounds (60.97 kg) BMI:     21.24 Temp:     98.6 degrees F (37.00 degrees C) oral Resp:     14 per minute BP sitting:   140 / 90  (left arm) Cuff size:   regular  Vitals Entered By: Ernestene Mention CMA (March 04, 2010 12:29 PM) CC: C/O continued stomach issue./kb Is Patient Diabetic? No Pain Assessment Patient in pain? no      Comments Patient notes that she has been having loose stools x2 days, with abd tenderness and some vomiting yesterday. She denies fever and blood in the stool. Patient is scheduled for colonoscopy in 2 weeks. Patient states that she is not taking Levsin. Ernestene Mention CMA  March 04, 2010 12:31 PM    Primary Care Provider:  Unice Cobble, MD   CC:  C/O continued stomach issue./kb.  History of Present Illness: After taking Kefir Culture 08/12 for breakfast ; that night she developed "gurgling " in stomach. On 08/13 she had gas & voluminous  loose stools X 3 . Lightheaded since.After more Kefir  08/15 she had 2-3 less voluminous loose stools  but less severe.She had anxiety & tachycardia prompting her to remain home. BM now more solid.  Current Medications (verified): 1)  Levsin/sl 0.125 Mg  Subl (Hyoscyamine Sulfate) .Marland Kitchen.. 1 Sl Q 4-6 Hrs As Needed 2)  Moviprep 100 Gm  Solr (Peg-Kcl-Nacl-Nasulf-Na Asc-C) .... As Per Prep Instructions.  Allergies (verified): 1)  ! Amoxicillin  Review of Systems General:  Complains of sweats; denies chills and fever. GI:  Denies abdominal pain, bloody stools, and dark tarry stools; Vomiting X 1 08/16.  Physical Exam  General:  well-nourished,in no acute distress; alert,appropriate and cooperative throughout examination Eyes:  No corneal or conjunctival inflammation noted.No icterus  Mouth:  Oral mucosa and oropharynx without lesions or exudates.  Teeth in good repair. Mild pharyngeal  erythema.   Lungs:  Normal respiratory effort, chest expands symmetrically. Lungs are clear to auscultation, no crackles or wheezes. Heart:  Normal rate and regular rhythm. S1 and S2 normal without gallop, murmur, click, rub . S4 with slurring Abdomen:  Bowel sounds positive,abdomen soft and non-tender without masses, organomegaly or hernias noted.Aorta palpable  Skin:  Slightly damp ; no jaundice Cervical Nodes:  No lymphadenopathy noted Axillary Nodes:  No palpable lymphadenopathy   Impression & Recommendations:  Problem # 1:  GASTROENTERITIS (ICD-558.9) ? from [place] culture  Complete Medication List: 1)  Moviprep 100 Gm Solr (Peg-kcl-nacl-nasulf-na asc-c) .... As per prep instructions. 2)  Clidinium-chlordiazepoxide 2.5-5 Mg Caps (Clidinium-chlordiazepoxide) .Marland Kitchen.. 1 every 6 hrs as needed for bowel symptoms  Patient Instructions: 1)  Drink clear liquids only for the next 24 hours, then slowly add other liquids and food as you  tolerate them.Add dairy or grease only after completely well for 48-72 hrs. Trial of Align once daily until bowels return to normal. 2)  Recommended remaining out of work for  today. Prescriptions: CLIDINIUM-CHLORDIAZEPOXIDE 2.5-5 MG CAPS (CLIDINIUM-CHLORDIAZEPOXIDE) 1 every 6 hrs as needed for bowel symptoms  #15 x 0   Entered and Authorized by:   Unice Cobble MD   Signed by:   Unice Cobble MD on 03/04/2010   Method used:   Faxed to ...       [facility] [address] [id number] (retail)  [place], [place]  [address]       Ph: [phone]       Fax: [phone]   RxID:   [id number]

## 2010-08-18 NOTE — Progress Notes (Signed)
Summary: Triage: Eye concerns  Phone Note Call from Patient Call back at Work Phone [phone]   Caller: Patient Summary of Call: Message left on triage VM: patient woke up yesterday and couldn't open her eye, patient thinks she has severe dry eye. Patient would like to know if Dr.Hopper would recommend some drops or should she contact her eye doctor.   Shonna Chock CMA  February 12, 2010 2:56 PM   Follow-up for Phone Call        I spoke with patient and informed her she can try OTC dry eye relief and if no help she should see her eye Dr. Patient stated that she already schedule appointment for tomorrow, her eye doctor is Dr.Sethi but he is out and she will see Dr.Digby Follow-up by: Shonna Chock CMA,  February 12, 2010 4:37 PM

## 2010-08-19 ENCOUNTER — Ambulatory Visit: Payer: PRIVATE HEALTH INSURANCE | Admitting: Family Medicine

## 2010-08-19 ENCOUNTER — Encounter: Payer: Self-pay | Admitting: Family Medicine

## 2010-08-19 ENCOUNTER — Ambulatory Visit: Admit: 2010-08-19 | Payer: Self-pay | Admitting: Sports Medicine

## 2010-08-19 DIAGNOSIS — M25519 Pain in unspecified shoulder: Secondary | ICD-10-CM | POA: Insufficient documentation

## 2010-08-19 DIAGNOSIS — M25579 Pain in unspecified ankle and joints of unspecified foot: Secondary | ICD-10-CM

## 2010-08-19 DIAGNOSIS — M25569 Pain in unspecified knee: Secondary | ICD-10-CM | POA: Insufficient documentation

## 2010-08-20 NOTE — Assessment & Plan Note (Signed)
Summary: RT ANKLE PAIN,MC   Vital Signs:  Patient profile:   55 year old female Pulse rate:   68 / minute BP sitting:   178 / 101  (left arm)  Vitals Entered By: Lillia Pauls CMA (August 05, 2010 2:46 PM) CC: rt lateral ankle pain- twisted @ lunch today   Referring Provider:  na Primary Provider:  Marga Melnick, MD   CC:  rt lateral ankle pain- twisted @ lunch today.  History of Present Illness: 56 yo F inversion Rt ankle sprain today at lunch approx 1 hr prior to presentation to clinic.  She steped on a small object and rolled her ankle laterally. She heard a snap and felt pain. She is able to walk with pain. She iced her ankle and called for an appointment at the [facility] medicine center.  She denies any swelling or bruising at this time. Has previously rolled this ankle multiple times before, but has always tried to rehab it well.  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 Amoxicillin  Past History:  Past Medical History: Last updated: 02/11/2010 Fibrocystic breast disease; Gilbert's Syndrome; G 0 P 0 Hyperlipidemia : Framingham Study LDL goal = < 160. NMR 2006:  LDL 106(1696/ 1086), HDL 49, TG 54. LDL goal = < 90 based on NMR Lipoprofile. Anxiety Disorder  Social History: Last updated: 03/20/2010 Occupation: Futures trader  Single Never Smoked Alcohol use-no Regular exercise-yes: walking once daily , yoga  Review of Systems  The patient denies anorexia, fever, and weight loss.    Physical Exam  General:  well-nourished,in no acute distress; alert,appropriate and cooperative throughout examination  BP elevated Msk:  Ankle: Right ankle is normal appearing, does have mild swelling but no bruising.  TTP directly over lateral mallelous, but nothing posteriorly. Ankle opens with talar tilt.  Normal anterior drawer.  Non tender over the ATFand PTF ligamnets, mild to mod ttp over CFL. NT over base of 5th MT, navicular, cuboid, talus, or med malleolus.  No ttp over prox fibula. Neg Kleiger and no ttp over syndesmosis.  Gait: antalgic, but is able to walk 4 steps unassisted  [place] ankle: Small cortical irregularity over tender region on the lateral mallelous along the insertion of the CFL. Mild edema present. Pulses:  R posterior tibial normal and R dorsalis pedis normal.   Neurologic:  alert & oriented X3.     Impression & Recommendations:  Problem # 1:  ANKLE PAIN, RIGHT (ICD-719.47) Assessment New Ankle sprain with small avulsion fracture seen on [place]  - RICE therapy + aircast for 2 weeks - declined crutches - ROM exercises at home (ABCs) starting in 3-5 days when pain improved - nsaids/tylenol prn - Will follow  up in 2 weeks and plan to start home PT then.   Orders: Aircast Ankle Brace (L4350) [place] LIMITED ([address])  Complete Medication List: 1)  Doxycycline Hyclate 100 Mg Caps (Doxycycline hyclate) .Marland Kitchen.. 1 two times a day ; avoid direct sun   Orders Added: 1)  Aircast Ankle Brace [L4350] 2)  Est. Patient Level III [60454] 3)  [place] LIMITED [[id number]]

## 2010-08-24 ENCOUNTER — Telehealth: Payer: Self-pay | Admitting: Gastroenterology

## 2010-08-26 NOTE — Assessment & Plan Note (Signed)
Summary: 3:45 APPT FU ANKLE/MC/MJD   Vital Signs:  Patient profile:   55 year old female BP sitting:   147 / 98  Vitals Entered By: Lillia Pauls CMA (August 19, 2010 3:50 PM)  Referring Provider:  na Primary Provider:  Marga Melnick, MD    History of Present Illness: 55 yo F here to f/u on Rt ankle sprain with small lat mall avulsion sustained 2 weeks ago.  Has been in [place], began weaning herself 5 days ago.  Has been doing some yoga as well.  No increased swelling or ecchymosis.  Also with some mild Rt knee pain, worst on inferor medial aspect.  Occurs with bending/squatting during yoga.  Also with some b/l shoulder pains, only mild.  Does computer work all day, then gets some pain doing yoga poses.  Allergies: 1)  ! Amoxicillin  Physical Exam  General:  Well-developed,well-nourished,in no acute distress; alert,appropriate and cooperative throughout examination Msk:  Rt ankle: FROM, no effusion.  Mild ttp at lateral malleolus tip.  Mildly increased laxity on talar tilt. Ant drawer stable.  No ttp anywhere else.  Neg Kleiger.  Rt knee: no ttp.  Dec quad atrophy compared to Lt side  Shoulders: FROM    Impression & Recommendations:  Problem # 1:  ANKLE PAIN, RIGHT (ICD-719.47) Assessment Improved Lateral ankle sprain with avulsion now 80 % better - wean aircast to only use with activity - increase rehab to do theraband exercises then progress to proprioception exercises in 1-2 weeks - f/u prn  Problem # 2:  KNEE PAIN, RIGHT (ICD-719.46) Has some quad atrophy, but no obvious abnl  - instructed on SLRs and quad sets - f/u as needed if no improvement, but without swelling/locking symptoms I doubt she has serious internal derangement  Problem # 3:  SHOULDER PAIN (ICD-719.41) B/l, I think mostly from neglect of scap and RC muscles  - given handout and reviewed rehab exercises from these 2 muscle groups - f/u prn  Complete Medication List: 1)  Doxycycline Hyclate  100 Mg Caps (Doxycycline hyclate) .Marland Kitchen.. 1 two times a day ; avoid direct sun   Orders Added: 1)  Est. Patient Level IV [[address]]

## 2010-08-26 NOTE — Progress Notes (Signed)
Summary: [place]  Phone Note Call from Patient Call back at Home Phone [phone]   Caller: Patient Call For: Dr. Jarold Motto Reason for Call: Talk to Nurse Summary of Call: Pt needs to speak with nurse about when she needs to be seen again and if she needs to do follow up labs Initial call taken by: [place] Johnson,  August 17, 2010 3:02 PM  Follow-up for Phone Call        Lmom for patient to return my call. Per office visit on 03/20/10, Dr Norval Gable orders stated labs q6 months for Hemachromatosis: LFT's and Iron Panel. Labs entered for March, 2013. Graciella Freer RN  August 18, 2010 9:22 AM   Patient did not return my call. Harlow Mares, CMA has a reminder to contact patient in March,2012. Follow-up by: Graciella Freer RN,  August 19, 2010 9:40 AM

## 2010-08-31 ENCOUNTER — Other Ambulatory Visit: Payer: Self-pay | Admitting: Obstetrics and Gynecology

## 2010-08-31 DIAGNOSIS — Z1231 Encounter for screening mammogram for malignant neoplasm of breast: Secondary | ICD-10-CM

## 2010-09-03 NOTE — Progress Notes (Signed)
Summary: Triage  Phone Note Call from Patient Call back at Work Phone [phone]   Caller: Patient Call For: Dr. Sharlett Iles Reason for Call: Talk to Nurse Summary of Call: Wants to know when she will be due for Labs again Initial call taken by: Webb Laws,  August 24, 2010 9:12 AM  Follow-up for Phone Call        Notified patient that she can have her labs drawn anytime in March. Mearl Latin has a flag reminder that she needs labs q6 months. Patient stated understanding. Follow-up by: Shella Maxim RN,  August 24, 2010 9:50 AM

## 2010-09-14 ENCOUNTER — Ambulatory Visit: Payer: PRIVATE HEALTH INSURANCE

## 2010-09-23 ENCOUNTER — Other Ambulatory Visit: Payer: PRIVATE HEALTH INSURANCE

## 2010-09-23 ENCOUNTER — Encounter (INDEPENDENT_AMBULATORY_CARE_PROVIDER_SITE_OTHER): Payer: Self-pay | Admitting: *Deleted

## 2010-09-23 ENCOUNTER — Other Ambulatory Visit: Payer: Self-pay | Admitting: Gastroenterology

## 2010-09-23 DIAGNOSIS — R7989 Other specified abnormal findings of blood chemistry: Secondary | ICD-10-CM

## 2010-09-23 LAB — HEPATIC FUNCTION PANEL
ALT: 15 U/L (ref 0–35)
Alkaline Phosphatase: 68 U/L (ref 39–117)
Bilirubin, Direct: 0.2 mg/dL (ref 0.0–0.3)
Total Protein: 6.6 g/dL (ref 6.0–8.3)

## 2010-10-12 ENCOUNTER — Ambulatory Visit
Admission: RE | Admit: 2010-10-12 | Discharge: 2010-10-12 | Disposition: A | Discharge status: Home / Self Care | Payer: PRIVATE HEALTH INSURANCE | Source: Ambulatory Visit | Attending: Obstetrics and Gynecology | Admitting: Obstetrics and Gynecology

## 2010-10-12 DIAGNOSIS — Z1231 Encounter for screening mammogram for malignant neoplasm of breast: Secondary | ICD-10-CM

## 2011-01-12 ENCOUNTER — Other Ambulatory Visit: Payer: Self-pay | Admitting: Physician Assistant

## 2011-01-12 DIAGNOSIS — C4491 Basal cell carcinoma of skin, unspecified: Secondary | ICD-10-CM

## 2011-01-12 HISTORY — DX: Basal cell carcinoma of skin, unspecified: C44.91

## 2011-01-27 ENCOUNTER — Telehealth: Payer: Self-pay | Admitting: Internal Medicine

## 2011-01-27 NOTE — Telephone Encounter (Signed)
Both this doctor and Dr. Sarajane Jews have excellent reputations based on multiple patients who have  been to them

## 2011-01-27 NOTE — Telephone Encounter (Signed)
Patient needs basil cell removed from her nose  -she is scheduled with  Dr Alvester Ch[facility]  - she wants to know if dr hopper would recommend this dr or another md - she doesn't want a large scar

## 2011-01-27 NOTE — Telephone Encounter (Signed)
pls advise

## 2011-01-28 NOTE — Telephone Encounter (Signed)
Left message to call office

## 2011-01-28 NOTE — Telephone Encounter (Signed)
Discuss with patient  

## 2011-03-04 ENCOUNTER — Encounter: Payer: Self-pay | Admitting: Internal Medicine

## 2011-03-04 ENCOUNTER — Ambulatory Visit: Payer: PRIVATE HEALTH INSURANCE | Admitting: Internal Medicine

## 2011-03-24 ENCOUNTER — Telehealth: Payer: Self-pay | Admitting: *Deleted

## 2011-03-24 DIAGNOSIS — R7989 Other specified abnormal findings of blood chemistry: Secondary | ICD-10-CM

## 2011-03-24 NOTE — Telephone Encounter (Signed)
Message copied by Leonette Monarch on Wed Mar 24, 2011  3:22 PM ------      Message from: Harlow Mares D      Created: Fri Oct 02, 2010  8:26 AM       Lfts, ferrirtin

## 2011-03-24 NOTE — Telephone Encounter (Signed)
Pt aware she is due for labs and will come tomorrow and have them she needs same labs every 6 months,.

## 2011-03-25 ENCOUNTER — Other Ambulatory Visit (INDEPENDENT_AMBULATORY_CARE_PROVIDER_SITE_OTHER): Payer: PRIVATE HEALTH INSURANCE

## 2011-03-25 DIAGNOSIS — R7989 Other specified abnormal findings of blood chemistry: Secondary | ICD-10-CM

## 2011-03-25 LAB — HEPATIC FUNCTION PANEL
ALT: 11 U/L (ref 0–35)
Albumin: 4.2 g/dL (ref 3.5–5.2)
Alkaline Phosphatase: 54 U/L (ref 39–117)
Bilirubin, Direct: 0.1 mg/dL (ref 0.0–0.3)
Total Protein: 6.5 g/dL (ref 6.0–8.3)

## 2011-03-26 ENCOUNTER — Encounter: Payer: Self-pay | Admitting: Family

## 2011-03-26 ENCOUNTER — Telehealth: Payer: Self-pay | Admitting: *Deleted

## 2011-03-26 ENCOUNTER — Ambulatory Visit (INDEPENDENT_AMBULATORY_CARE_PROVIDER_SITE_OTHER): Payer: PRIVATE HEALTH INSURANCE | Admitting: Family

## 2011-03-26 DIAGNOSIS — S9031XA Contusion of right foot, initial encounter: Secondary | ICD-10-CM | POA: Insufficient documentation

## 2011-03-26 DIAGNOSIS — T63461A Toxic effect of venom of wasps, accidental (unintentional), initial encounter: Secondary | ICD-10-CM

## 2011-03-26 DIAGNOSIS — S9030XA Contusion of unspecified foot, initial encounter: Secondary | ICD-10-CM

## 2011-03-26 DIAGNOSIS — Z91030 Bee allergy status: Secondary | ICD-10-CM | POA: Insufficient documentation

## 2011-03-26 NOTE — Telephone Encounter (Signed)
Advised pt normal labs and she will need recheck in 6 months. I will call to remind her and I am mailing her a copy of the labs.

## 2011-03-26 NOTE — Telephone Encounter (Signed)
Message copied by Leonette Monarch on Fri Mar 26, 2011  1:01 PM ------      Message from: PATTERSON, DAVID R      Created: Fri Mar 26, 2011 12:22 PM       Continue to check ferritin levels every 6 months. There is no change from previous levels.

## 2011-03-26 NOTE — Assessment & Plan Note (Signed)
She declines x-ray.  I recommended that she call if increased swelling or if she develops pain.

## 2011-03-26 NOTE — Progress Notes (Signed)
  Subjective:    Patient ID: Anita Schmidt, female    DOB: Dec 07, 1955, 55 y.o.   MRN: [id number]  HPI  Anita Schmidt is a 55 yr old female who presents today with chief complaint of right foot bruising. She reports that 5 days ago a 2 lb can of tomatoes dropped onto her right foot.  She has developed bruising of the right foot.  She has continued with her walking and yoga.  Denies significant pain or swelling.    Bee allergy and latex allergy.  Notes local swelling occurred last time she was stung by a bee. She is requesting referral to allergy.    She also notes that she feels like she "leans to one side" when doing yoga and "one pant leg is shorter than the other." Review of Systems See HPI  Past Medical History  Diagnosis Date  . Fibrocystic breast disease   . Gilbert's syndrome   . Hyperlipidemia   . Anxiety disorder     History   Social History  . Marital Status: Single    Spouse Name: N/A    Number of Children: N/A  . Years of Education: N/A   Occupational History  . Not on file.   Social History Main Topics  . Smoking status: Never Smoker   . Smokeless tobacco: Not on file  . Alcohol Use: No  . Drug Use: No  . Sexually Active: Not on file   Other Topics Concern  . Not on file   Social History Narrative  . No narrative on file    Past Surgical History  Procedure Date  . Dilation and curettage of uterus   . Tonsillectomy   . Inguinal hernia repair   . Tracer pellets 2003    in breast  . Breast biopsy     Family History  Problem Relation Age of Onset  . Hypertension Father     PMH  . Hypertension Mother     PMH  . Alzheimer's disease Maternal Grandmother   . Hypertension Maternal Grandmother     Allergies  Allergen Reactions  . Amoxicillin     REACTION: GI UPSET  . Epinephrine     ? jittery  . Latex Itching    No current outpatient prescriptions on file prior to visit.    BP 138/90  Pulse 72  Temp(Src) 98.2 F (36.8 C) (Oral)  Resp  16  Ht 5' 6.73" (1.695 m)  Wt 131 lb 1.9 oz (59.476 kg)  BMI 20.70 kg/m2       Objective:   Physical Exam  Constitutional: She appears well-developed and well-nourished.  Musculoskeletal: She exhibits no edema.       Bilateral leg length is equal.  ? Mild curvature of LS spine.   R foot without swelling or tenderness.   Skin:       + ecchymosis noted dorsal aspect of right foot.           Assessment & Plan:  I discussed with her that her "unevenness"  May be due to slight curvature of spine.  Nothing to do at this point except continue her regular exercise.

## 2011-03-26 NOTE — Patient Instructions (Signed)
You will be contacted about your referral to the Allergist. Call if increased pain, swelling of the right foot.

## 2011-03-26 NOTE — Assessment & Plan Note (Signed)
55 yr old female with history of local reaction to bee stings.  Also reports + latex allergy.  Epinephrine is listed as an allergy for her.  I recommended that she keep benadryl on hand and will refer to allergist for further testing.

## 2011-04-14 ENCOUNTER — Encounter: Payer: Self-pay | Admitting: Internal Medicine

## 2011-04-14 ENCOUNTER — Ambulatory Visit (INDEPENDENT_AMBULATORY_CARE_PROVIDER_SITE_OTHER): Payer: PRIVATE HEALTH INSURANCE | Admitting: Internal Medicine

## 2011-04-14 VITALS — BP 126/82 | HR 63 | Temp 99.0°F | Wt 131.0 lb

## 2011-04-14 DIAGNOSIS — I498 Other specified cardiac arrhythmias: Secondary | ICD-10-CM

## 2011-04-14 DIAGNOSIS — I4902 Ventricular flutter: Secondary | ICD-10-CM

## 2011-04-14 NOTE — Patient Instructions (Signed)
To prevent palpitations or premature beats, avoid stimulants such as decongestants, diet pills, nicotine, or caffeine (coffee, tea, cola, or chocolate) to excess.

## 2011-04-14 NOTE — Progress Notes (Signed)
Subjective:    Patient ID: Anita Schmidt, female    DOB: 1955-11-20, 55 y.o.   MRN: [id number]  HPIPalpitations:Onset: 2 weeks ago Trigger/ exacerbating factor: no except decreased CVE (swimming); she thinks it might increase if she becomes active after having been sitting for a while. She questions going back to an exercise program, possibly  @ a higher level Character:strong beating Duration:5-10 seconds Treatment: none Constitutional:no fever, chills,  fatigue, sleep issues. She is having sweats which she believes are hormonally related. She's lost 11 pounds with a lifestyle( nutrition & exercise) program. Cardiovascular:no chest pain, syncope, diaphoresis, claudication GI:no change in bowels, anorexia Derm:no skin, hair, or nail changes Neurologic:no tremor, numbness , or  weakness. 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 Psych:some anxiety; no depression or panic attacks Endocrine:no hoarseness, temperature intolerance Possible triggers:no new medications, stimulants(decongestants, diet pills, nicotine, caffeine)   There is a history of hypertension in her mother and both maternal grandparents.    Review of Systems     Objective:   Physical Exam Gen.: Thin but healthy and well-nourished in appearance. Alert, appropriate and cooperative throughout exam. Head: Normocephalic without obvious abnormalities Eyes: No corneal or conjunctival inflammation noted. Pupils equal round reactive to light and accommodation.  Extraocular motion intact. No lid lag.  Neck: No deformities, masses, or tenderness noted. Range of motion & . Thyroid normal. Lungs: Normal respiratory effort; chest expands symmetrically. Lungs are clear to auscultation without rales, wheezes, or increased work of breathing. Heart: Normal rate and rhythm. Normal S1 and S2. No gallop, click, or rub. Grade 1/6 systolic murmur. Abdomen: Bowel sounds  normal; abdomen soft and nontender. No masses, organomegaly or hernias noted. Aorta palpable                                                                            Musculoskeletal/extremities: No deformity or scoliosis noted of  the thoracic or lumbar spine. No clubbing, cyanosis, edema, or deformity noted. Range of motion  normal .Tone & strength  normal.Joints normal. Nail health good. She is able to lie back and set up without help. Straight leg raising is negative to 90. Vascular: Carotid, radial artery, dorsalis pedis and  posterior tibial pulses are full and equal. No bruits present. Neurologic: Alert and oriented x3. Deep tendon reflexes symmetrical and normal.         Skin: Intact without suspicious lesions or rashes. Lymph: No cervical, axillary  lymphadenopathy present. Psych: Mood and affect are normal. Normally interactive                                                                                         Assessment & Plan:  #1 palpitations, EKG is normal. No family history of coronary disease.   #2 Sweats, hormonal.  Plan: See orders.  Before initiating an exercise program  or increase in up to a higher level, would recommend a stress test.

## 2011-04-15 LAB — CBC WITH DIFFERENTIAL/PLATELET
Basophils Relative: 0.3 % (ref 0.0–3.0)
Eosinophils Absolute: 0.1 10*3/uL (ref 0.0–0.7)
Eosinophils Relative: 2.9 % (ref 0.0–5.0)
Hemoglobin: 14.6 g/dL (ref 12.0–15.0)
Lymphocytes Relative: 48.8 % — ABNORMAL HIGH (ref 12.0–46.0)
MCHC: 33.3 g/dL (ref 30.0–36.0)
Monocytes Relative: 8.3 % (ref 3.0–12.0)
Neutro Abs: 1.9 10*3/uL (ref 1.4–7.7)
RBC: 4.52 Mil/uL (ref 3.87–5.11)

## 2011-04-16 LAB — BASIC METABOLIC PANEL
BUN: 15 mg/dL (ref 6–23)
CO2: 28 mEq/L (ref 19–32)
Chloride: 107 mEq/L (ref 96–112)
Creatinine, Ser: 0.7 mg/dL (ref 0.4–1.2)
Glucose, Bld: 96 mg/dL (ref 70–99)

## 2011-04-23 ENCOUNTER — Encounter: Payer: PRIVATE HEALTH INSURANCE | Admitting: Physician Assistant

## 2011-05-10 ENCOUNTER — Telehealth: Payer: Self-pay | Admitting: Internal Medicine

## 2011-05-10 NOTE — Telephone Encounter (Signed)
The stress test was to assess the palpitations, not BP. If she has no palpitations with stress it would be reassuring as to absence of underlying heart disease

## 2011-05-10 NOTE — Telephone Encounter (Signed)
Patient is scheduled for stress test [phone] - she said her bp seems to be find - she wants to cancel stress test

## 2011-05-10 NOTE — Telephone Encounter (Signed)
Left message to call office

## 2011-05-10 NOTE — Telephone Encounter (Signed)
Hop pls advise 

## 2011-05-11 NOTE — Telephone Encounter (Signed)
Discuss with patient who states that she had cancel procedure but will call to reschedule.

## 2011-05-12 ENCOUNTER — Ambulatory Visit (INDEPENDENT_AMBULATORY_CARE_PROVIDER_SITE_OTHER): Payer: PRIVATE HEALTH INSURANCE | Admitting: Physician Assistant

## 2011-05-12 ENCOUNTER — Encounter: Payer: PRIVATE HEALTH INSURANCE | Admitting: Physician Assistant

## 2011-05-12 DIAGNOSIS — I498 Other specified cardiac arrhythmias: Secondary | ICD-10-CM

## 2011-05-12 DIAGNOSIS — I4902 Ventricular flutter: Secondary | ICD-10-CM

## 2011-05-12 NOTE — Progress Notes (Signed)
Anita Schmidt is a 55 y.o. female with diet controlled HLP and anxiety disorder.  No h/o DM2, HTN, CKD.  No FHx of CAD.  No h/o smoking.  Has noted fluctuations in BP recently and atypical chest pain.  She wants to start exercising.  Her PCP referred her for ETT.  Exam notable for 1/6 SEM at LUSB; otherwise unremarkable.  Exercise Treadmill Test  Pre-Exercise Testing Evaluation Rhythm: normal sinus  Rate: 64   PR:  .15 QRS:  .08  QT:  .39 QTc: .40     Test  Exercise Tolerance Test Ordering MD: Marga Melnick M.D  Interpreting MD:  Janene Harvey PA-C  Unique Test No: 1  Treadmill:  1  Indication for ETT: chest pain - rule out ischemia  Contraindication to ETT: No   Stress Modality: exercise - treadmill  Cardiac Imaging Performed: non   Protocol: standard Bruce - maximal  Max BP:  200/91  Max MPHR (bpm):  163 85% MPR (bpm):  139  MPHR obtained (bpm):  138 % MPHR obtained:  85  Reached 85% MPHR (min:sec):  8:50 Total Exercise Time (min-sec):  8:59  Workload in METS:  12.5 Borg Scale: 17  Reason ETT Terminated:  patient's desire to stop    ST Segment Analysis At Rest: normal ST segments - no evidence of significant ST depression With Exercise: no evidence of significant ST depression  Other Information Arrhythmia:  No Angina during ETT:  absent (0) Quality of ETT:  diagnostic  ETT Interpretation:  normal - no evidence of ischemia by ST analysis  Comments: Good exercise tolerance. No chest pain. Hypertensive BP response to exercise. No ST-T changes to suggest ischemia.   Recommendations: Follow up with Dr. Alwyn Ren as directed. Continue to monitor BP. If never done, consider Echo for murmur.  She can discuss with Dr. Alwyn Ren.

## 2011-09-20 ENCOUNTER — Telehealth: Payer: Self-pay | Admitting: *Deleted

## 2011-09-20 DIAGNOSIS — R7989 Other specified abnormal findings of blood chemistry: Secondary | ICD-10-CM

## 2011-09-20 NOTE — Telephone Encounter (Signed)
Spoke with pts mother and she will have pt call me back

## 2011-09-20 NOTE — Telephone Encounter (Signed)
Message copied by Sheral Flow on Mon Sep 20, 2011  3:02 PM ------      Message from: Bernita Buffy D      Created: Wed Mar 24, 2011  3:25 PM                   ----- Message -----         From: Bernita Buffy, CMA         Sent: 03/22/2011           To: Bernita Buffy            Lfts, ferrirtin

## 2011-09-20 NOTE — Telephone Encounter (Signed)
Pt aware and she will come Friday.

## 2011-10-14 ENCOUNTER — Other Ambulatory Visit: Payer: Self-pay | Admitting: Gastroenterology

## 2011-10-14 ENCOUNTER — Other Ambulatory Visit (INDEPENDENT_AMBULATORY_CARE_PROVIDER_SITE_OTHER): Payer: PRIVATE HEALTH INSURANCE

## 2011-10-14 DIAGNOSIS — R7989 Other specified abnormal findings of blood chemistry: Secondary | ICD-10-CM

## 2011-10-14 LAB — HEPATIC FUNCTION PANEL
AST: 15 U/L (ref 0–37)
Total Bilirubin: 0.6 mg/dL (ref 0.3–1.2)

## 2011-10-14 LAB — FERRITIN: Ferritin: 184.4 ng/mL (ref 10.0–291.0)

## 2011-11-19 ENCOUNTER — Other Ambulatory Visit: Payer: Self-pay | Admitting: Obstetrics and Gynecology

## 2011-11-19 DIAGNOSIS — Z1231 Encounter for screening mammogram for malignant neoplasm of breast: Secondary | ICD-10-CM

## 2011-11-29 ENCOUNTER — Ambulatory Visit
Admission: RE | Admit: 2011-11-29 | Discharge: 2011-11-29 | Disposition: A | Discharge status: Home / Self Care | Payer: PRIVATE HEALTH INSURANCE | Source: Ambulatory Visit | Attending: Obstetrics and Gynecology | Admitting: Obstetrics and Gynecology

## 2011-11-29 DIAGNOSIS — Z1231 Encounter for screening mammogram for malignant neoplasm of breast: Secondary | ICD-10-CM

## 2011-12-02 ENCOUNTER — Other Ambulatory Visit: Payer: Self-pay | Admitting: *Deleted

## 2011-12-02 DIAGNOSIS — N63 Unspecified lump in unspecified breast: Secondary | ICD-10-CM

## 2011-12-08 ENCOUNTER — Ambulatory Visit
Admission: RE | Admit: 2011-12-08 | Discharge: 2011-12-08 | Disposition: A | Discharge status: Home / Self Care | Payer: PRIVATE HEALTH INSURANCE | Source: Ambulatory Visit | Attending: Obstetrics and Gynecology | Admitting: Obstetrics and Gynecology

## 2011-12-08 ENCOUNTER — Encounter: Payer: Self-pay | Admitting: Gynecology

## 2011-12-08 DIAGNOSIS — N63 Unspecified lump in unspecified breast: Secondary | ICD-10-CM

## 2011-12-15 ENCOUNTER — Encounter: Payer: PRIVATE HEALTH INSURANCE | Admitting: Obstetrics and Gynecology

## 2012-01-11 ENCOUNTER — Encounter: Payer: Self-pay | Admitting: Obstetrics and Gynecology

## 2012-01-11 ENCOUNTER — Other Ambulatory Visit (HOSPITAL_COMMUNITY)
Admission: RE | Admit: 2012-01-11 | Discharge: 2012-01-11 | Disposition: A | Discharge status: Home / Self Care | Payer: 59 | Source: Ambulatory Visit | Attending: Obstetrics and Gynecology | Admitting: Obstetrics and Gynecology

## 2012-01-11 ENCOUNTER — Ambulatory Visit (INDEPENDENT_AMBULATORY_CARE_PROVIDER_SITE_OTHER): Payer: 59 | Admitting: Obstetrics and Gynecology

## 2012-01-11 VITALS — BP 136/84 | Ht 66.0 in | Wt 132.0 lb

## 2012-01-11 DIAGNOSIS — Z01419 Encounter for gynecological examination (general) (routine) without abnormal findings: Secondary | ICD-10-CM | POA: Insufficient documentation

## 2012-01-11 DIAGNOSIS — N950 Postmenopausal bleeding: Secondary | ICD-10-CM

## 2012-01-11 NOTE — Patient Instructions (Signed)
Schedule pelvic ultrasound.

## 2012-01-11 NOTE — Progress Notes (Signed)
Patient came to see me today for her annual GYN exam. She was flying to [place] two and one half weeks ago and under a lot of stress noticed some blood on her toilet tissue which she thought was vaginal. It only happen once and  has not occurred since. She's having no pelvic pain. She just had a mammogram showing calcification in her right breast. She was given the option of observation versus biopsy since it looked nonsuspicious and she has elected six-month followup. She is not sexually active. She is not having any significant menopausal symptoms. She does her bone densities through her PCP and they  are normal. She has always had normal Pap smears. Her last Pap was in 2011 and we have not seen her since then.  Physical examination:Anita Schmidt present. HEENT within normal limits. Neck: Thyroid not large. No masses. Supraclavicular nodes: not enlarged. Breasts: Examined in both sitting and lying  position. No skin changes and no masses. Abdomen: Soft no guarding rebound or masses or hernia. Pelvic: External: Within normal limits. BUS: Within normal limits. Vaginal:within normal limits. Good estrogen effect. No evidence of cystocele rectocele or enterocele. Cervix: clean. Uterus: Normal size and shape. Adnexa: No masses. Rectovaginal exam: Confirmatory and negative. Extremities: Within normal limits.  Assessment: #1. Postmenopausal bleeding #2. Abnormal mammogram with calcification.  Plan: Mammogram as per the breast center. Pelvic ultrasound due to the bleeding. Pap done as it has been 2 years I am not sure if she would timely come back in one year.

## 2012-01-12 LAB — URINALYSIS W MICROSCOPIC + REFLEX CULTURE
Hgb urine dipstick: NEGATIVE
Nitrite: NEGATIVE
Protein, ur: NEGATIVE mg/dL

## 2012-01-14 ENCOUNTER — Ambulatory Visit (INDEPENDENT_AMBULATORY_CARE_PROVIDER_SITE_OTHER): Payer: 59 | Admitting: Obstetrics and Gynecology

## 2012-01-14 ENCOUNTER — Ambulatory Visit (INDEPENDENT_AMBULATORY_CARE_PROVIDER_SITE_OTHER): Payer: 59

## 2012-01-14 DIAGNOSIS — D259 Leiomyoma of uterus, unspecified: Secondary | ICD-10-CM

## 2012-01-14 DIAGNOSIS — D219 Benign neoplasm of connective and other soft tissue, unspecified: Secondary | ICD-10-CM

## 2012-01-14 DIAGNOSIS — N950 Postmenopausal bleeding: Secondary | ICD-10-CM

## 2012-01-14 LAB — URINE CULTURE: Colony Count: 75000

## 2012-01-14 NOTE — Progress Notes (Signed)
Patient came back for a pelvic ultrasound today because of one episode of postmenopausal bleeding. In reviewing her last visit her Pap smear came back normal. Her urinalysis was not normal. She is growing out 75,000 colonies of gram-negative rods. She is asymptomatic.  Pelvic ultrasound: Her uterus is enlarged by 3 small fibroids. They range from 7 mm to 1.2 cm. Her endometrial echo is thin at 2 mm. Both ovaries are normal. Her cul-de-sac is free of fluid.  Assessment: #1. Urinary tract infection #2. Fibroids #3. Postmenopausal bleeding  Plan: Patient reassured about the ultrasound. She will call with more bleeding and I think at that point I would do an endometrial biopsy. We discussed her urine culture. We will call her back we'll we have antibiotic sensitivities.

## 2012-01-17 ENCOUNTER — Other Ambulatory Visit: Payer: Self-pay | Admitting: Obstetrics and Gynecology

## 2012-01-17 DIAGNOSIS — N390 Urinary tract infection, site not specified: Secondary | ICD-10-CM

## 2012-01-17 MED ORDER — CIPROFLOXACIN HCL 250 MG PO TABS: 250.0000 mg | ORAL_TABLET | Freq: Two times a day (BID) | ORAL | Status: AC

## 2012-01-27 ENCOUNTER — Other Ambulatory Visit: Payer: 59

## 2012-01-27 DIAGNOSIS — N390 Urinary tract infection, site not specified: Secondary | ICD-10-CM

## 2012-01-29 LAB — URINE CULTURE: Organism ID, Bacteria: NO GROWTH

## 2012-02-08 ENCOUNTER — Ambulatory Visit (HOSPITAL_COMMUNITY): Payer: 59 | Admitting: Licensed Clinical Social Worker

## 2012-02-09 ENCOUNTER — Other Ambulatory Visit: Payer: 59

## 2012-03-23 ENCOUNTER — Encounter (HOSPITAL_COMMUNITY): Payer: Self-pay | Admitting: Licensed Clinical Social Worker

## 2012-03-23 ENCOUNTER — Ambulatory Visit (INDEPENDENT_AMBULATORY_CARE_PROVIDER_SITE_OTHER): Payer: 59 | Admitting: Licensed Clinical Social Worker

## 2012-03-23 DIAGNOSIS — F411 Generalized anxiety disorder: Secondary | ICD-10-CM
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
## 2012-03-30 ENCOUNTER — Ambulatory Visit (INDEPENDENT_AMBULATORY_CARE_PROVIDER_SITE_OTHER): Payer: 59 | Admitting: Licensed Clinical Social Worker

## 2012-03-30 DIAGNOSIS — F411 Generalized anxiety disorder: Secondary | ICD-10-CM

## 2012-03-30 NOTE — Progress Notes (Signed)
   THERAPIST PROGRESS NOTE  Session Time: 4:00pm-4:50pm  Participation Level: Active  Behavioral Response: Well GroomedAlertAnxious  Type of Therapy: Individual Therapy  Treatment Goals addressed: Anxiety and Coping  Interventions: CBT, Supportive and Reframing  Summary: Anita Schmidt is a 55 y.o. female who presents with anxious mood and affect. Initially, she is uncertain what to discuss, but as the session progresses she endorses significant anxiety related to her health and her future. She endorses strong automatic negative thinking about what could be wrong with her body when she notices something or is diagnosed with a problem. She spends hours on line researching ways in which she can prevent physical problems since she does not want to take any medication. She is unhappy with her job and wants to make a big life change, but is uncertain what that is. She wants to relocate and stop working, and is upset that making a decision about this is turning out to be more difficult than she expected. She endorses racing thoughts at night which keep her awake at times.    Suicidal/Homicidal: Nowithout intent/plan  Therapist Response: Assessed patients current functioning and reviewed progress. Reviewed coping strategies. Assessed patients safety and assisted in identifying protective factors.  Reviewed crisis plan with patient. Assisted patient with the expression of her feelings of anxiety and frustration with her job and her future. Patient demonstrates hypochondriac thoughts and this will be a diagnosis to rule out. Used CBT to assist patient with the identification of negative distortions and irrational thoughts. Encouraged patient to verbalize alternative and factual responses which challenge thought distortions. Reviewed positive reframing and encouraged patient to do so in session, which she has difficulty with as expected. She is able to take redirection well. Reviewed patients self care  plan. Assessed  progress related to self care. Patient's self care is good. Recommend proper diet, regular exercise, socialization and recreation.   Plan: Return again in one to two weeks.  Diagnosis: Axis I: Anxiety Disorder NOS    Axis II: No diagnosis    Derreon Consalvo, LCSW 03/30/2012

## 2012-04-03 ENCOUNTER — Ambulatory Visit (INDEPENDENT_AMBULATORY_CARE_PROVIDER_SITE_OTHER): Payer: 59 | Admitting: Internal Medicine

## 2012-04-03 VITALS — BP 138/94 | HR 63 | Temp 98.0°F | Wt 132.2 lb

## 2012-04-03 DIAGNOSIS — H6980 Other specified disorders of Eustachian tube, unspecified ear: Secondary | ICD-10-CM

## 2012-04-03 MED ORDER — PREDNISONE 20 MG PO TABS: 10.0000 mg | ORAL_TABLET | Freq: Two times a day (BID) | ORAL | Status: DC

## 2012-04-03 MED ORDER — FLUTICASONE PROPIONATE 50 MCG/ACT NA SUSP: 1.0000 | Freq: Two times a day (BID) | NASAL | Status: DC | PRN

## 2012-04-03 NOTE — Patient Instructions (Addendum)
Go to Web MD for eustachian tube dysfunction. Drink thin  fluids liberally through the day and chew sugarless gum . Do the Valsalva maneuver several times a day to "pop" ears open. Flonase 1 spray into L  each nostril twice a day as needed. Use the "crossover" technique as discussed. Plain Mucinex for thick secretions ;force NON dairy fluids . Use a Neti pot daily as needed for sinus congestion; going from open side to congested side . Nasal cleansing in the shower as discussed. Make sure that all residual soap is removed to prevent irritation.  Plain Allegra 160 daily as needed for itchy eyes & sneezing.  If you activate My Chart; the results can be released to you as soon as they populate from the lab. If you choose not to use this program; the labs have to be reviewed, copied & mailed   causing a delay in getting the results to you.

## 2012-04-03 NOTE — Progress Notes (Signed)
  Subjective:    Patient ID: Anita Schmidt, female    DOB: 1955/08/03, 56 y.o.   MRN: [id number]  HPI Symptoms began approximately 4 weeks ago as pain in the left ear after swimming in a pool. She clean the ear out resulting in the production of some golden brown material.  Since that time she's noted a "crackling sound" and sensation of fullness or swelling in the ear. She also noticed some tenderness below the left TMJ joint several weeks ago but this has resolved.    Review of Systems  URI symptoms: no Fever: no   Tinnitus: no Dizziness: no Hearing loss: no Headache: no Toothache: not now Jaw click: no Rashes or lesions: no Facial muscle weakness: no PMH recurrent OM:no  PMH prior ear surgery or tubes: no Diabetes or Immunosuppresion:  no       Objective:   Physical Exam General appearance:good health ;well nourished; no acute distress or increased work of breathing is present.  No  lymphadenopathy about the head, neck, or axilla noted.   Eyes: No conjunctival inflammation or lid edema is present.   Ears:  External ear exam shows no significant lesions or deformities.  Otoscopic examination reveals clear canals, tympanic membranes are intact bilaterally without bulging, retraction, inflammation or discharge. Whisper at 6 feet bilaterally. Tuning fork exam is normal  Nose:  External nasal examination shows no deformity or inflammation. Nasal mucosa are pink and moist without lesions or exudates. No septal dislocation or deviation.No obstruction to airflow.   Oral exam: Dental hygiene is good; lips and gums are healthy appearing.There is no oropharyngeal erythema or exudate noted.  No TMJ  Neck:  No deformities, thyromegaly, masses, or tenderness noted.    Heart:  Normal rate and regular rhythm. S1 and S2 normal without gallop, murmur, click, rub or other extra sounds.   Lungs:Chest clear to auscultation; no wheezes, rhonchi,rales ,or rubs present.No increased work of  breathing.    Extremities:  No cyanosis, edema, or clubbing  noted    Skin: Warm & dry             Assessment & Plan:  #1 eustachian tube dysfunction, left  Plan :see orders and recommendations

## 2012-04-07 ENCOUNTER — Ambulatory Visit (HOSPITAL_COMMUNITY): Payer: Self-pay | Admitting: Licensed Clinical Social Worker

## 2012-04-07 ENCOUNTER — Encounter: Payer: Self-pay | Admitting: Internal Medicine

## 2012-04-07 ENCOUNTER — Ambulatory Visit (INDEPENDENT_AMBULATORY_CARE_PROVIDER_SITE_OTHER): Payer: 59 | Admitting: Internal Medicine

## 2012-04-07 VITALS — BP 128/86 | HR 75 | Temp 98.2°F | Wt 132.4 lb

## 2012-04-07 DIAGNOSIS — R51 Headache: Secondary | ICD-10-CM

## 2012-04-07 DIAGNOSIS — J069 Acute upper respiratory infection, unspecified: Secondary | ICD-10-CM

## 2012-04-07 MED ORDER — TRAMADOL HCL 50 MG PO TABS: 50.0000 mg | ORAL_TABLET | Freq: Three times a day (TID) | ORAL | Status: DC | PRN

## 2012-04-07 MED ORDER — SULFAMETHOXAZOLE-TRIMETHOPRIM 800-160 MG PO TABS: 1.0000 | ORAL_TABLET | Freq: Two times a day (BID) | ORAL | Status: DC

## 2012-04-07 NOTE — Progress Notes (Signed)
  Subjective:    Patient ID: Anita Schmidt, female    DOB: Jun 16, 1956, 56 y.o.   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 She awoke approximately 3 AM 9/19 with severe sore throat and postnasal drainage on the left with clear secretions.  She has now developed severe left frontal headache and profuse rhinitis on the left. There's been very scant purulent material from the left nare.  She has had no fever or sweats but has been chilled.    Review of Systems   She has had some pressure in the left maxillary sinus area without severe pain. She did not have extrinsic symptoms of itchy, watery eyes or sneezing initially she's had some sneezing in the last 24 hours.     Objective:   Physical Exam General appearance:good health  Thin but;well nourished; no acute distress or increased work of breathing is present but she appears fatigued.  No  lymphadenopathy about the head, neck, or axilla noted.   Eyes: No conjunctival inflammation or lid edema is present.   Ears:  External ear exam shows no significant lesions or deformities.  Otoscopic examination reveals clear canals, tympanic membranes are intact bilaterally without bulging, retraction, inflammation or discharge.  Nose:  External nasal examination shows no deformity or inflammation. Nasal mucosa boggy on L  without lesions or exudates. No septal dislocation or deviation.No obstruction to airflow.   Oral exam: Dental hygiene is good; lips and gums are healthy appearing.There is no oropharyngeal erythema or exudate noted.   Neck:  No deformities, masses, or tenderness noted.     Heart:  Normal rate and regular rhythm. S1 and S2 normal without gallop, murmur, click, rub or other extra sounds.   Lungs:Chest clear to auscultation; no wheezes, rhonchi,rales ,or rubs present.No increased work of breathing.    Extremities:  No cyanosis, edema, or clubbing  noted    Skin: Warm &  dry           Assessment & Plan:  #1 left frontal sinus headache with scant nasal purulence. Clinically probable early rhinosinusitis  Plan: See orders and recommendations

## 2012-04-07 NOTE — Patient Instructions (Addendum)
Zicam Melts or Zinc lozenges ; vitamin C 2000 mg daily; & Echinacea for 4-7 days. Fill antibiotic if fever, exudate("pus") or progressive pain present.

## 2012-04-18 ENCOUNTER — Ambulatory Visit (HOSPITAL_COMMUNITY): Payer: Self-pay | Admitting: Licensed Clinical Social Worker

## 2012-04-25 ENCOUNTER — Ambulatory Visit (HOSPITAL_COMMUNITY): Payer: Self-pay | Admitting: Licensed Clinical Social Worker

## 2012-05-03 ENCOUNTER — Telehealth (HOSPITAL_COMMUNITY): Payer: Self-pay

## 2012-05-04 ENCOUNTER — Ambulatory Visit (HOSPITAL_COMMUNITY): Payer: Self-pay | Admitting: Licensed Clinical Social Worker

## 2012-05-08 ENCOUNTER — Encounter: Payer: Self-pay | Admitting: Family Medicine

## 2012-05-08 ENCOUNTER — Telehealth: Payer: Self-pay | Admitting: Internal Medicine

## 2012-05-08 ENCOUNTER — Ambulatory Visit (INDEPENDENT_AMBULATORY_CARE_PROVIDER_SITE_OTHER): Payer: 59 | Admitting: Family Medicine

## 2012-05-08 VITALS — BP 158/94 | HR 71 | Temp 98.3°F | Ht 66.0 in | Wt 132.0 lb

## 2012-05-08 DIAGNOSIS — K529 Noninfective gastroenteritis and colitis, unspecified: Secondary | ICD-10-CM | POA: Insufficient documentation

## 2012-05-08 DIAGNOSIS — K5289 Other specified noninfective gastroenteritis and colitis: Secondary | ICD-10-CM

## 2012-05-08 LAB — CBC WITH DIFFERENTIAL/PLATELET
Basophils Absolute: 0 10*3/uL (ref 0.0–0.1)
Eosinophils Relative: 1.1 % (ref 0.0–5.0)
MCV: 94.5 fl (ref 78.0–100.0)
Monocytes Absolute: 0.6 10*3/uL (ref 0.1–1.0)
Neutrophils Relative %: 47.3 % (ref 43.0–77.0)
Platelets: 207 10*3/uL (ref 150.0–400.0)
WBC: 4.3 10*3/uL — ABNORMAL LOW (ref 4.5–10.5)

## 2012-05-08 LAB — COMPREHENSIVE METABOLIC PANEL
ALT: 19 U/L (ref 0–35)
AST: 21 U/L (ref 0–37)
Albumin: 3.8 g/dL (ref 3.5–5.2)
Alkaline Phosphatase: 54 U/L (ref 39–117)
Glucose, Bld: 85 mg/dL (ref 70–99)
Potassium: 3.9 mEq/L (ref 3.5–5.1)
Sodium: 140 mEq/L (ref 135–145)
Total Protein: 6.4 g/dL (ref 6.0–8.3)

## 2012-05-08 LAB — H. PYLORI ANTIBODY, IGG: H Pylori IgG: NEGATIVE

## 2012-05-08 MED ORDER — DEXLANSOPRAZOLE 60 MG PO CPDR: 60.0000 mg | DELAYED_RELEASE_CAPSULE | Freq: Every day | ORAL | Status: DC

## 2012-05-08 NOTE — Telephone Encounter (Signed)
Noted patient to be seen be Dr.McGowen. Per Dr.Hopper protocol if patient with scheduled appointment or sent to Urgent Care/Emergency Room ok to close encounter

## 2012-05-08 NOTE — Telephone Encounter (Signed)
Caller: Delicia/Patient; Patient Name: Anita Schmidt; PCP: Unice Cobble; [place] Phone Number: [phone] Calling regarding diarrhea that started 05/03/12, having pudding like stools. No blood in stool. Afebrile. Voiding well. Having about 4-5 loose stools/day. She states she knows something is just not right. Emergent signs and symptoms ruled out as per Diarrhea or Other Change in Bowel Habits protocol except for see in 24 hours due to diarrhea lasting longer than 24 hours. No appts available at office via [facility], no appts available with Lemar Livings NP at [facility]. Spoke with [place] in office to scheduled for next day and offered appt with Dr. Ernestine Conrad at [facility] office 05/08/12 at 1400, pt states she will take that appt.

## 2012-05-08 NOTE — Progress Notes (Signed)
OFFICE NOTE  05/08/2012  CC:  Chief Complaint  Patient presents with  . Diarrhea    started last Wednesday, still having loose stools/diarrhea daily, nausea; ate Fayrene Helper     HPI: Patient is a 56 y.o. Caucasian female who is here for diarrhea.  She normally sees Dr. Alwyn Ren but his schedule is full today.  Reports 1 day of explosive, watery diarrhea, crampy/bloated feeling about 5d/a, then has still had 4-5 loose stools per day and some mild nausea since then.  She is drinking well.  No fever.  No HA, no muscle or joint aches.  Has hx of mild IBS-type sx's in the past--high stress situation has caused persistent diarrhea before for her.  Pertinent PMH:  Past Medical History  Diagnosis Date  . Fibrocystic breast disease   . Gilbert's syndrome   . Hyperlipidemia   . Anxiety disorder   . Hemochromatosis   . Anxiety     MEDS:  None presently  PE: Blood pressure 158/94, pulse 71, temperature 98.3 F (36.8 C), temperature source Temporal, height 5\' 6"  (1.676 m), weight 132 lb (59.875 kg). Gen: Alert, well appearing.  Patient is oriented to person, place, time, and situation. AFFECT: pleasant, lucid thought and speech. ENT: Ears: EACs clear, normal epithelium.  TMs with good light reflex and landmarks bilaterally.  Eyes: no injection, icteris, swelling, or exudate.  EOMI, PERRLA. Nose: no drainage or turbinate edema/swelling.  No injection or focal lesion.  Mouth: lips without lesion/swelling.  Oral mucosa pink and moist.  Dentition intact and without obvious caries or gingival swelling.  Oropharynx without erythema, exudate, or swelling.  Neck: supple, left submandibular lymph node mildly enlarged, nontender, soft and moveable. CV: RRR, no m/r/g.   LUNGS: CTA bilat, nonlabored resps, good aeration in all lung fields. ABD: soft, NT, ND, BS normal.  No hepatospenomegaly or mass.  No bruits. EXT: no clubbing, cyanosis, or edema.    IMPRESSION AND PLAN:  Gastroenteritis,  acute Viral GE that possibly caused mild IBS flare.   Other possibilities are: acute bacterial GE, H. Pylori gastritis, C. Diff colitis. Plan: take dexilant 60mg  qd once daily (samples given). She declined anti-emetics today.  We agreed that no anti-diarrheal med is indicated right now. Bland diet recommended.    FOLLOW UP: prn

## 2012-05-08 NOTE — Assessment & Plan Note (Addendum)
Viral GE that possibly caused mild IBS flare.   Other possibilities are: acute bacterial GE, H. Pylori gastritis, C. Diff colitis. Plan: take dexilant 64m qd once daily (samples given). She declined anti-emetics today.  We agreed that no anti-diarrheal med is indicated right now. Bland diet recommended. Continue to push fluids. No sign of significant dehydration today. Will check stool for giardia/crypto, rotavirus, c diff pcr, fecal lactoferrin, and bacterial culture.  Will also check CBC, CMET, and H. Pylori IgG.

## 2012-05-08 NOTE — Telephone Encounter (Signed)
I saw pt today at my office in [place]--PM

## 2012-05-09 ENCOUNTER — Other Ambulatory Visit: Payer: 59

## 2012-05-10 LAB — GIARDIA/CRYPTOSPORIDIUM (EIA)
Cryptosporidium Screen (EIA): NEGATIVE
Giardia Screen (EIA): NEGATIVE

## 2012-05-10 LAB — FECAL LACTOFERRIN, QUANT: Lactoferrin: POSITIVE

## 2012-05-13 LAB — STOOL CULTURE

## 2012-07-06 ENCOUNTER — Ambulatory Visit (HOSPITAL_COMMUNITY): Payer: Self-pay | Admitting: Licensed Clinical Social Worker

## 2012-07-07 ENCOUNTER — Telehealth: Payer: Self-pay | Admitting: Internal Medicine

## 2012-07-07 ENCOUNTER — Ambulatory Visit (INDEPENDENT_AMBULATORY_CARE_PROVIDER_SITE_OTHER): Payer: 59

## 2012-07-07 DIAGNOSIS — Z0000 Encounter for general adult medical examination without abnormal findings: Secondary | ICD-10-CM

## 2012-07-07 DIAGNOSIS — Z23 Encounter for immunization: Secondary | ICD-10-CM

## 2012-07-07 NOTE — Telephone Encounter (Signed)
Pt was bite by dog about a week ago, and wants to know if she needs to have a tetanus shot or not

## 2012-07-07 NOTE — Telephone Encounter (Signed)
Pt was bit by a dog about a week ago and wants to know if she needs a tetanus shot or not

## 2012-07-07 NOTE — Telephone Encounter (Signed)
Spoke with patient, scheduled nurse visit appointment today

## 2012-07-25 ENCOUNTER — Encounter: Payer: Self-pay | Admitting: Internal Medicine

## 2012-07-25 ENCOUNTER — Ambulatory Visit (INDEPENDENT_AMBULATORY_CARE_PROVIDER_SITE_OTHER): Payer: 59 | Admitting: Internal Medicine

## 2012-07-25 VITALS — BP 114/72 | HR 59 | Temp 97.9°F | Wt 138.2 lb

## 2012-07-25 DIAGNOSIS — M6740 Ganglion, unspecified site: Secondary | ICD-10-CM

## 2012-07-25 NOTE — Patient Instructions (Addendum)
Use an anti-inflammatory cream such as Aspercreme or Zostrix cream twice a day as you massage the ganglion . Do not apply ice . The hand Surgery referral will be scheduled if symptoms persist or progress

## 2012-07-25 NOTE — Progress Notes (Signed)
  Subjective:    Patient ID: CASANDRA DALLAIRE, female    DOB: 09/11/1955, 57 y.o.   MRN: [id number]  HPI MASS Onset:4 mos ago Location : R palm Trigger/injury: ? Related to use of computer pen 8 hrs /day. It was noted incidentatlly. No associated pain Pain, redness swelling:no  Treatment/response:deep self massage with partial resolution    Review of Systems Constitutional: no fever, chills, sweats, change in weight  Musculoskeletal:no  muscle cramps or pain; no  joint stiffness, redness, or swelling Skin:no rash, color change Neuro: no weakness; incontinence (stool/urine); numbness and tingling Heme:no lymphadenopathy; abnormal bruising or bleeding        Objective:   Physical Exam Gen.: Healthy and well-nourished in appearance. Alert, appropriate and cooperative throughout exam.                                                                  Musculoskeletal/extremities:  No clubbing, cyanosis, edema, or deformity noted. Hand range of motion  normal .Tone & strength  normal.Joints normal. Nail health good. She has a palpable ganglion  of the palm below the third right digit.  Vascular: Radial artery pulses are full and equal.  Neurologic: Alert and oriented x3. Deep tendon reflexes symmetrical and normal.          Skin: Intact without suspicious lesions or rashes. Lymph: No cervical, axillary, or epitrochlear lymphadenopathy present.           Assessment & Plan:  #1 palmar ganglion.  Pathophysiology of ganglion cyst  and options reviewed. Definitive treatment would be referral to Hand Surgeon for complete evacuation.

## 2012-08-03 ENCOUNTER — Encounter (HOSPITAL_COMMUNITY): Payer: Self-pay | Admitting: Licensed Clinical Social Worker

## 2012-08-03 ENCOUNTER — Ambulatory Visit (INDEPENDENT_AMBULATORY_CARE_PROVIDER_SITE_OTHER): Payer: 59 | Admitting: Licensed Clinical Social Worker

## 2012-08-03 DIAGNOSIS — F411 Generalized anxiety disorder: Secondary | ICD-10-CM

## 2012-08-03 NOTE — Progress Notes (Signed)
   THERAPIST PROGRESS NOTE  Session Time: 4:00pm-4:50pm  Participation Level: Active  Behavioral Response: Well GroomedAlertAnxious and Depressed  Type of Therapy: Individual Therapy  Treatment Goals addressed: Anxiety and Coping  Interventions: CBT, Solution Focused, Strength-based, Supportive and Reframing  Summary: Anita Schmidt is a 57 y.o. female who presents with depressed mood and anxious affect. Patient has returned to therapy after her last session in September because she is unhappy at her job, overwhelmed with her life and needs to make changes, but does not know how or what to do. She is tearful throughout the session, states that she keeps herself busy to avoid crying. She is unhappy and unfulfilled at her job. She feels constrained by her age and feels she has made mistakes about her life choices which have now left her with limited options for her future. Many of her friends are retiring and she is unable to do this. She does endorse social anxiety and enjoys staying home. She is able to spend time with some friends as long as it is within the constraints of what she needs to manage her anxiety. She has booked a trip next week, but is fearful to fly, is fearful she will panic and is uncertain what to do about this. Her sleep is inconsistent and she does not wake feeling rested. Her appetite is wnl.    Suicidal/Homicidal: Nowithout intent/plan  Therapist Response: Assessed patients current functioning and reviewed progress. Reviewed coping strategies. Assessed patients safety and assisted in identifying protective factors.  Reviewed crisis plan with patient. Assisted patient with the expression of her feelings of anxiety and fear. Patients thinking is tangential, distorted and irrational at times. She is clearly overwhelmed and unable to problem solve. She demonstrates difficulty assessing what she can handle appropriately and has significant self doubt. Used CBT to assist patient  with the identification of negative distortions and irrational thoughts. Encouraged patient to verbalize alternative and factual responses which challenge thought distortions. Used motivational interviewing to assist and encourage patient through the change process. Explored patients barriers to change. Used DBT to practice mindfulness, review distraction list and improve distress tolerance skills. Reviewed patients self care plan. Assessed  progress related to self care. Patient's self care is good. Recommend proper diet, regular exercise, socialization and recreation. Discussed a plan to help patient decide about her trip. Will meet with patient on Monday to further problem solve.   Plan: Return again in one weeks.  Diagnosis: Axis I: Anxiety Disorder NOS    Axis II: No diagnosis    Mizuki Hoel, LCSW 08/03/2012

## 2012-08-07 ENCOUNTER — Ambulatory Visit (INDEPENDENT_AMBULATORY_CARE_PROVIDER_SITE_OTHER): Payer: 59 | Admitting: Licensed Clinical Social Worker

## 2012-08-07 DIAGNOSIS — F411 Generalized anxiety disorder: Secondary | ICD-10-CM

## 2012-08-07 NOTE — Progress Notes (Signed)
   THERAPIST PROGRESS NOTE  Session Time: 4:00pm-4:50pm  Participation Level: Active  Behavioral Response: Well GroomedAlertAnxious and Depressed  Type of Therapy: Individual Therapy  Treatment Goals addressed: Coping  Interventions: CBT, Solution Focused, Strength-based, Supportive and Reframing  Summary: Anita Schmidt is a 57 y.o. female who presents with depressed mood and anxious affect. She has decided to travel to [place] and expresses anxiety about her upcoming trip. She wants to "get it over with" and has begun thinking about strategies to decrease her anxiety on the plane. She is focusing on the negative outcomes and the possibility of things that could go wrong while there, such as being mugged. She does not find purpose in her job any more and is fearful of exploring her options outside her job, but realizes that she needs to do this. She is against any medication to help with anxiety or depression. Her sleep is disrupted and she endorses fatigue.    Suicidal/Homicidal: Nowithout intent/plan  Therapist Response: Assessed patients current functioning and reviewed progress. Reviewed coping strategies. Assessed patients safety and assisted in identifying protective factors.  Reviewed crisis plan with patient. Assisted patient with the expression of her feelings of anxiety. Patient demonstrates negative, distorted and irrational thinking. Used CBT to assist patient with the identification of negative distortions and irrational thoughts. Encouraged patient to verbalize alternative and factual responses which challenge thought distortions. Discussed specific solutions for anxiety reduction. Used DBT to practice mindfulness, review distraction list and improve distress tolerance skills. Reviewed patients self care plan. Assessed  progress related to self care. Patient's self care is good. Recommend proper diet, regular exercise, socialization and recreation.   Plan: Return again in two  weeks.  Diagnosis: Axis I: Anxiety Disorder NOS    Axis II: No diagnosis    Sheehan Stacey, LCSW 08/07/2012

## 2012-08-11 ENCOUNTER — Ambulatory Visit (INDEPENDENT_AMBULATORY_CARE_PROVIDER_SITE_OTHER): Payer: 59 | Admitting: Licensed Clinical Social Worker

## 2012-08-11 DIAGNOSIS — F411 Generalized anxiety disorder: Secondary | ICD-10-CM

## 2012-08-11 NOTE — Progress Notes (Signed)
   THERAPIST PROGRESS NOTE  Session Time: 10:30am-11:20am  Participation Level: Active  Behavioral Response: Well GroomedAlertAnxious  Type of Therapy: Individual Therapy  Treatment Goals addressed: Anxiety and Coping  Interventions: CBT, DBT, Strength-based, Supportive and Reframing  Summary: Anita Schmidt is a 57 y.o. female who presents with anxious mood and tearful affect. She discusses her experience of driving to the airport for her trip and having panic attacks. She would frequently pull her car over, would begin to cry and would then try to talk herself back into going. She eventually called the airline to cancel and was told that her flight was cancelled. She did not try to rebook her trip and returned home. While she is relieved that she did not go, she is partly disappointed as well. She has been keeping a log and has written down all the times in her life she can recall trying to travel via airplane and having panic over each trip. Many times she did not go, or would go part way and would then return home. This also occurred once on a train she felt confined in. At work, she does not lock the door to the bathroom because she is fearful she will be locked in and that no one will find her and it will take a long time to get out.  Her sleep is disrupted and her appetite is wnl.   Suicidal/Homicidal: Nowithout intent/plan  Therapist Response: Assessed patients current functioning and reviewed progress. Reviewed coping strategies. Assessed patients safety and assisted in identifying protective factors.  Reviewed crisis plan with patient. Assisted patient with the expression of her feelings of anxiety. Patients thoughts are negative, distorted and irrational. She clearly suffers from claustrophobia and thus far has avoided any systematic desensitizing in her life to build tolerance. Used CBT to assist patient with the identification of negative distortions and irrational thoughts.  Encouraged patient to verbalize alternative and factual responses which challenge thought distortions. Used DBT to practice mindfulness, review distraction list and improve distress tolerance skills. Reviewed patients self care plan. Assessed  progress related to self care. Patient's self care is good. Recommend proper diet, regular exercise, socialization and recreation.   Plan: Return again in one weeks.  Diagnosis: Axis I: Generalized Anxiety Disorder    Axis II: No diagnosis    Dameian Crisman, LCSW 08/11/2012

## 2012-08-25 ENCOUNTER — Ambulatory Visit (HOSPITAL_COMMUNITY): Payer: Self-pay | Admitting: Licensed Clinical Social Worker

## 2012-08-28 ENCOUNTER — Ambulatory Visit (INDEPENDENT_AMBULATORY_CARE_PROVIDER_SITE_OTHER): Payer: 59 | Admitting: Licensed Clinical Social Worker

## 2012-08-28 DIAGNOSIS — F411 Generalized anxiety disorder: Secondary | ICD-10-CM

## 2012-08-28 NOTE — Progress Notes (Signed)
   THERAPIST PROGRESS NOTE  Session Time: 4:00pm-4:50pm  Participation Level: Active  Behavioral Response: Well GroomedAlertAnxious and Depressed  Type of Therapy: Individual Therapy  Treatment Goals addressed: Anxiety and Coping  Interventions: CBT, Solution Focused, Strength-based, Supportive and Reframing  Summary: Anita Schmidt is a 57 y.o. female who presents with anxious mood and tearful affect. She reports having a good week because her boss was out of town and she realized that she needs to be her own boss. She reports thinking about her old boyfriends more than she would like. She processes her last relationship and her sadness over his death. She questions her decisions and struggles with strong self doubt. She is frustrated with her difficulty making decisions. She realizes that she needs to get back in touch with what she enjoys in life. She is lonely and fearful about growing old alone. Her sleep and appetite are wnl.    Suicidal/Homicidal: Nowithout intent/plan  Therapist Response: Assessed patients current functioning and reviewed progress. Reviewed coping strategies. Assessed patients safety and assisted in identifying protective factors.  Reviewed crisis plan with patient. Assisted patient with the expression of her feelings of anxiety. Patient demonstrates strong negative self talk, self doubt and distorted internal dialogue.Used CBT to assist patient with the identification of negative distortions and irrational thoughts. Encouraged patient to verbalize alternative and factual responses which challenge thought distortions. Explored her decision making difficulty as a skill deficit. Used DBT to practice mindfulness, review distraction list and improve distress tolerance skills. Reviewed patients self care plan. Assessed  progress related to self care. Patient's self care is good. Recommend proper diet, regular exercise, socialization and recreation.   Plan: Return again in one  weeks.  Diagnosis: Axis I: Anxiety Disorder NOS    Axis II: No diagnosis    Ensley Blas, LCSW 08/28/2012

## 2012-09-04 ENCOUNTER — Ambulatory Visit (HOSPITAL_COMMUNITY): Payer: Self-pay | Admitting: Licensed Clinical Social Worker

## 2012-09-11 ENCOUNTER — Ambulatory Visit (INDEPENDENT_AMBULATORY_CARE_PROVIDER_SITE_OTHER): Payer: 59 | Admitting: Licensed Clinical Social Worker

## 2012-09-11 NOTE — Progress Notes (Signed)
   THERAPIST PROGRESS NOTE  Session Time: 4:00pm-4:50pm  Participation Level: Active  Behavioral Response: Well GroomedAlertAnxious and Depressed  Type of Therapy: Individual Therapy  Treatment Goals addressed: Coping  Interventions: CBT, Solution Focused, Strength-based, Supportive and Reframing  Summary: Anita Schmidt is a 57 y.o. female who presents with anxious mood and affect. She reports continued stress at work related to her unhappiness with the actual work and the people whom she works with. She will have to travel to [place] next month and contemplates flying or driving. She processes her anxiety about flying and her desire to over come this fear, but is uncertain if this is the time to do it. She is hurt by the relationship with her brother and explores feeling cut off emotionally from him. She wants to express her feelings to him, but finds this difficult. Overall, she finds expressing her feelings to others challenging, particularly, when setting a boundary is involved. Her sleep and appetite are wnl.    Suicidal/Homicidal: Nowithout intent/plan  Therapist Response: Assessed patients current functioning and reviewed progress. Reviewed coping strategies. Assessed patients safety and assisted in identifying protective factors.  Reviewed crisis plan with patient. Assisted patient with the expression of her feelings of anxeity. Used CBT to assist patient with the identification of negative distortions and irrational thoughts. Encouraged patient to verbalize alternative and factual responses which challenge thought distortions. Explored the consequences of flying verses driving. Patient is able to give herself permission to drive and choose improved self care by the end of the session. Used DBT to practice mindfulness, review distraction list and improve distress tolerance skills. Reviewed patients self care plan. Assessed  progress related to self care. Patient's self care is  improving. Recommend proper diet, regular exercise, socialization and recreation.   Plan: Return again in one weeks.  Diagnosis: Axis I: Anxiety Disorder NOS    Axis II: No diagnosis    Reonna Finlayson, LCSW 09/11/2012

## 2012-09-21 ENCOUNTER — Ambulatory Visit: Payer: Self-pay | Admitting: Internal Medicine

## 2012-09-22 ENCOUNTER — Ambulatory Visit: Payer: 59 | Admitting: Internal Medicine

## 2012-10-02 ENCOUNTER — Encounter: Payer: Self-pay | Admitting: Internal Medicine

## 2012-10-02 ENCOUNTER — Ambulatory Visit (INDEPENDENT_AMBULATORY_CARE_PROVIDER_SITE_OTHER): Payer: 59 | Admitting: Internal Medicine

## 2012-10-02 VITALS — BP 132/84 | HR 68 | Temp 98.2°F | Wt 135.0 lb

## 2012-10-02 DIAGNOSIS — H6990 Unspecified Eustachian tube disorder, unspecified ear: Secondary | ICD-10-CM

## 2012-10-02 DIAGNOSIS — R6884 Jaw pain: Secondary | ICD-10-CM

## 2012-10-02 DIAGNOSIS — H6980 Other specified disorders of Eustachian tube, unspecified ear: Secondary | ICD-10-CM

## 2012-10-02 MED ORDER — FLUTICASONE PROPIONATE 50 MCG/ACT NA SUSP: 1.0000 | Freq: Two times a day (BID) | NASAL | Status: DC | PRN

## 2012-10-02 NOTE — Patient Instructions (Addendum)

## 2012-10-02 NOTE — Progress Notes (Signed)
  Subjective:    Patient ID: Anita Schmidt, female    DOB: 17-May-1956, 57 y.o.   MRN: [id number]  HPI Symptoms began approximately 3 weeks ago as an indolent discomfort over the left lower facial area seemingly in the maxilla and mandibular areas. The discomfort was greatest in the mandible. Flossing did result in improvement but the pain recurred. Dental exam and x-rays were negative.  At the advice of friends she employed motion saline spray with resolution of symptoms  She has had scant yellow nasal discharge but this was not insignificant    Review of Systems Specifically she denies frontal headache, facial pain in the area of the maxillary sinuses, sore throat, otic pain or otic discharge. She also denies any redness, swelling, or temperature change in the area of the discomfort.  She also denies hearing loss or tinnitus. She's had some residual pressure symptoms in the ear since last summer after diving in pool.  She's had no fever, chills, or sweats.     Objective:   Physical Exam General appearance: thin but in good health ;well nourished; no acute distress or increased work of breathing is present.  No  lymphadenopathy about the head, neck, or axilla noted.   Eyes: No conjunctival inflammation or lid edema is present.EOMI; PERRL Ears:  External ear exam shows no significant lesions or deformities.  Otoscopic examination reveals clear canals, tympanic membranes are intact bilaterally without bulging, retraction, inflammation or discharge. Tympanic membranes slightly dull. Hearing intact to whisper at 6 feet.  Nose:  External nasal examination shows no deformity or inflammation. Nasal mucosa are pink and moist without lesions or exudates. No septal dislocation or deviation.No obstruction to airflow.   Oral exam: Dental hygiene is good; lips and gums are healthy appearing.There is no oropharyngeal erythema or exudate noted. There is slight rightward dislocation of the mandible with  mastication maneuvers.  Neck:  No deformities, thyromegaly, masses, or tenderness noted.   Supple with full range of motion without pain.   Heart:  Normal rate and regular rhythm. S1 and S2 normal without gallop, click, rub or other extra sounds. Grade 8/7-8 over 6 systolic murmur  Lungs:Chest clear to auscultation; no wheezes, rhonchi,rales ,or rubs present.No increased work of breathing.    Extremities:  No cyanosis, edema, or clubbing  noted    Skin: Warm & dry .         Assessment & Plan:  #1 left jaw pain, resolved. There is slight TMJ dislocation with mastication. Clinically there is no evidence of parotitis or C2 neuralgia.  Plan: If symptoms persist; I do recommend reevaluation concerning the mild mandibular dislocation. She should be seen acutely if symptoms recur. Nasal hygiene will be stressed because of the symptoms of scant nasal discharge.

## 2012-10-04 ENCOUNTER — Ambulatory Visit (HOSPITAL_COMMUNITY): Payer: Self-pay | Admitting: Licensed Clinical Social Worker

## 2012-10-09 ENCOUNTER — Ambulatory Visit (INDEPENDENT_AMBULATORY_CARE_PROVIDER_SITE_OTHER): Payer: 59 | Admitting: Licensed Clinical Social Worker

## 2012-10-09 DIAGNOSIS — F411 Generalized anxiety disorder: Secondary | ICD-10-CM

## 2012-10-09 NOTE — Progress Notes (Signed)
   THERAPIST PROGRESS NOTE  Session Time: 4:00pm-4:50pm  Participation Level: Active  Behavioral Response: Well GroomedAlertAnxious and Depressed  Type of Therapy: Individual Therapy  Treatment Goals addressed: Coping  Interventions: CBT, Motivational Interviewing, Strength-based, Supportive and Reframing  Summary: Anita Schmidt is a 57 y.o. female who presents with depressed mood and anxious affect. She reports ongoing frustration with her job and her general life situation. She does not know what to change in her life or how to change. She feels stuck and considers moving away, but feels she must stay in case her mother needs to live with her at some point in time. She discusses her complicated relationship with her mother, who patient feels is controlling and who judges her decisions and choices in life. She notices that she is very focused on the future and anticipating problems. She feels that she has lost 10 years of her life trying to decide what to do. Her sleep is disrupted and her appetite is wnl.    Suicidal/Homicidal: Nowithout intent/plan  Therapist Response: Assessed patients current functioning and reviewed progress. Reviewed coping strategies. Assessed patients safety and assisted in identifying protective factors.  Reviewed crisis plan with patient. Assisted patient with the expression of anxiety and frustratyion. Reviewed patients self care plan. Assessed progress related to self care. Patients self care is good. Recommend daily exercise, increased socialization and recreation. Used CBT to assist patient with the identification of negative distortions and irrational thoughts. Encouraged patient to verbalize alternative and factual responses which challenge thought distortions. Used motivational interviewing to assist and encourage patient through the change process. Explored patients barriers to change. Reviewed healthy boundaries and assertive communication.   Plan: Return  again in three weeks.  Diagnosis: Axis I: Anxiety Disorder NOS    Axis II: No diagnosis    Khristi Schiller, LCSW 10/09/2012

## 2012-10-10 ENCOUNTER — Ambulatory Visit (HOSPITAL_COMMUNITY): Payer: Self-pay | Admitting: Licensed Clinical Social Worker

## 2012-10-16 ENCOUNTER — Ambulatory Visit (HOSPITAL_COMMUNITY): Payer: Self-pay | Admitting: Licensed Clinical Social Worker

## 2012-10-30 ENCOUNTER — Ambulatory Visit (HOSPITAL_COMMUNITY): Payer: Self-pay | Admitting: Licensed Clinical Social Worker

## 2012-11-01 ENCOUNTER — Encounter: Payer: Self-pay | Admitting: Obstetrics and Gynecology

## 2012-11-02 ENCOUNTER — Ambulatory Visit (HOSPITAL_COMMUNITY): Payer: Self-pay | Admitting: Licensed Clinical Social Worker

## 2012-11-13 ENCOUNTER — Ambulatory Visit (INDEPENDENT_AMBULATORY_CARE_PROVIDER_SITE_OTHER): Payer: 59 | Admitting: Licensed Clinical Social Worker

## 2012-11-13 ENCOUNTER — Encounter: Payer: Self-pay | Admitting: Obstetrics and Gynecology

## 2012-11-13 DIAGNOSIS — F411 Generalized anxiety disorder: Secondary | ICD-10-CM

## 2012-11-13 NOTE — Progress Notes (Signed)
   THERAPIST PROGRESS NOTE  Session Time: 4:00pm-4:50pm  Participation Level: Active  Behavioral Response: Well GroomedAlertAnxious  Type of Therapy: Individual Therapy  Treatment Goals addressed: Anxiety and Coping  Interventions: CBT, DBT, Strength-based, Supportive and Reframing  Summary: Anita Schmidt is a 57 y.o. female who presents with anxious mood and bright affect. She reports having a very enjoyable time in [place] and is surprised by how confident she felt. She was pleased that she managed her anxiety well while there. She has decided to accept that she needs to continue to work at her job and live in [place] for now. She is tired of focusing on what she doesn't have. She has been invited to attend a spiritual retreat but she is uncertain if this is the best thing for her to do. She is cautious about spending a lot of time with others and is fearful of sleeping in a room with strangers. She processes her sadness and anger related to her nieces decision to cut off patients side of the family. This has enabled her to gain perspective about being treated poorly by her niece. Her sleep is disrupted on occasion and her appetite is wnl.     Suicidal/Homicidal: Nowithout intent/plan  Therapist Response: Assessed patients current functioning and reviewed progress. Reviewed coping strategies. Assessed patients safety and assisted in identifying protective factors.  Reviewed crisis plan with patient. Assisted patient with the expression of frustration. Reviewed patients self care plan. Assessed progress related to self care. Patients self care is good. Recommend daily exercise, increased socialization and recreation. Used CBT to assist patient with the identification of negative distortions and irrational thoughts. Encouraged patient to verbalize alternative and factual responses which challenge thought distortions. Processed and normalized patients grief reaction. Reviewed healthy  boundaries and assertive communication. Used DBT to practice mindfulness, review distraction list and improve distress tolerance skills.   Plan: Return again in two to three weeks.  Diagnosis: Axis I: Anxiety Disorder NOS    Axis II: No diagnosis    Navy Belay, LCSW 11/13/2012

## 2012-11-22 ENCOUNTER — Encounter: Payer: Self-pay | Admitting: Obstetrics and Gynecology

## 2012-11-22 ENCOUNTER — Ambulatory Visit (INDEPENDENT_AMBULATORY_CARE_PROVIDER_SITE_OTHER): Payer: 59 | Admitting: Obstetrics and Gynecology

## 2012-11-22 VITALS — BP 122/76 | Ht 66.0 in | Wt 133.0 lb

## 2012-11-22 DIAGNOSIS — Z01419 Encounter for gynecological examination (general) (routine) without abnormal findings: Secondary | ICD-10-CM

## 2012-11-22 DIAGNOSIS — R921 Mammographic calcification found on diagnostic imaging of breast: Secondary | ICD-10-CM

## 2012-11-22 DIAGNOSIS — R928 Other abnormal and inconclusive findings on diagnostic imaging of breast: Secondary | ICD-10-CM

## 2012-11-22 DIAGNOSIS — Z0000 Encounter for general adult medical examination without abnormal findings: Secondary | ICD-10-CM

## 2012-11-22 LAB — POCT URINALYSIS DIPSTICK
Blood, UA: NEGATIVE
Nitrite, UA: NEGATIVE
Protein, UA: NEGATIVE
Urobilinogen, UA: NEGATIVE
pH, UA: 5

## 2012-11-22 NOTE — Progress Notes (Signed)
Patient ID: Anita Schmidt, female   DOB: Jun 22, 1956, 57 y.o.   MRN: [id number] 56 y.o.  Single  Caucasian female   [id number] here for annual exam.   Patient had an episode of bleeding and it was unclear if it was postmenopausal bleeding or not.  Had an ultrasound with Dr. Cherylann Banas and was diagnosed with 3 fibroids.  Measurements were 1.3 cm, 8 mm, and 11 mm.  Ovaries were normal and EMS measured 2.0 mm.  No further bleeding. No EMB performed.  Has a history of postmenopausal bleeding and had a D & C with Dr. Warnell Forester, which was benign.    Never took hormone therapy.    Not sexually active.  Patient was frustrated with her last mammogram and ultrasound experience and decided not to follow up for a 6 month recheck in November 2013 for calcifications in the right breast.  She states she had a lot of unanswered questions.    Patient's last menstrual period was 07/19/2008.          Sexually active: no  The current method of family planning is abstinence.    Exercising: yoga, walking, pilates, swimming Last mammogram:  11/2011 Had a diagnostic mammogram and ultrasound showing calcifications.   Last pap smear: 12/2011 wnl.  No high risk HPV studies were performed.   History of abnormal pap: yes approximately at age 57.  No treatment.   Smoking:no Alcohol: occasional wine.  2 bottles per week.  Last colonoscopy: 2011 with Dr. Olga Millers. Next due 2021 Last Bone Density: 2012 wnl:[facility]  Last tetanus shot: 2014 Dr. Linna Darner Last cholesterol check: 2013 with PCP.  Had elevated blood sugar.    Hgb: PCP               Urine:neg    Health Maintenance  Topic Date Due  . Influenza Vaccine  03/19/2013  . Mammogram  12/07/2013  . Pap Smear  01/11/2015  . Colonoscopy  03/16/2020  . Tetanus/tdap  07/07/2022    Family History  Problem Relation Age of Onset  . Hypertension Mother     PMH  . Alcohol abuse Mother   . Alzheimer's disease Maternal Grandmother   . Hypertension Maternal  Grandmother   . Anxiety disorder Brother     Patient Active Problem List   Diagnosis Date Noted  . Hemochromatosis   . Bee sting allergy 03/26/2011  . IRON, SERUM, ELEVATED 02/13/2010  . IBS 02/10/2010  . ANXIETY DISORDER 12/25/2008  . [place] DISEASE, CERVICAL 11/19/2008  . RHINITIS 09/19/2008  . HYPERLIPIDEMIA 08/01/2008  . 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 MENOPAUSAL SYNDROME 08/01/2008  . FIBROCYSTIC BREAST DISEASE, HX OF 08/01/2008    Past Medical History  Diagnosis Date  . Fibrocystic breast disease   . Gilbert's syndrome   . Hyperlipidemia   . Anxiety disorder   . Hemochromatosis   . Anxiety   . Skin cancer, basal cell     nose    Past Surgical History  Procedure Laterality Date  . Dilation and curettage of uterus    . Tonsillectomy    . Inguinal hernia repair    . Tracer pellets  2003    in breast  . Breast biopsy      Allergies: Latex; Epinephrine; and Amoxicillin  Current Outpatient Prescriptions  Medication Sig Dispense Refill  . fluticasone (FLONASE) 50 MCG/ACT nasal spray Place 1 spray into the nose 2 (two) times daily as needed for rhinitis.  16 g  2   No  current facility-administered medications for this visit.    ROS: Pertinent items are noted in HPI.  Social Hx:  Patient is a Electrical engineer. Does yoga, pilates, swimming.   Exam:    BP 122/76  Ht 5' 6"  (1.676 m)  Wt 133 lb (60.328 kg)  BMI 21.48 kg/m2  LMP 07/19/2008   Wt Readings from Last 3 Encounters:  11/22/12 133 lb (60.328 kg)  10/02/12 135 lb (61.236 kg)  07/25/12 138 lb 3.2 oz (62.687 kg)     Ht Readings from Last 3 Encounters:  11/22/12 5' 6"  (1.676 m)  05/08/12 5' 6"  (1.676 m)  01/11/12 5' 6"  (1.676 m)    General appearance: alert, cooperative and appears stated age Head: Normocephalic, without obvious abnormality, atraumatic Neck: no adenopathy, supple, symmetrical, trachea midline and thyroid not enlarged, symmetric, no tenderness/mass/nodules Lungs: clear to auscultation  bilaterally Breasts: Inspection negative, No nipple retraction or dimpling, No nipple discharge or bleeding, No axillary or supraclavicular adenopathy, Normal to palpation without dominant masses Heart: regular rate and rhythm Abdomen: soft, non-tender; bowel sounds normal; no masses,  no organomegaly Extremities: extremities normal, atraumatic, no cyanosis or edema Skin: Skin color, texture, turgor normal. No rashes or lesions Lymph nodes: Cervical, supraclavicular, and axillary nodes normal. No abnormal inguinal nodes palpated Neurologic: Grossly normal   Pelvic: External genitalia:  no lesions              Urethra:  normal appearing urethra with no masses, tenderness or lesions              Bartholins and Skenes: normal                 Vagina: normal appearing vagina with normal color and discharge, no lesions              Cervix: normal appearance.  Petechiae with speculum exam.                Pap taken: yes and high risk HPV studies requested.          Bimanual Exam:  Uterus:  uterus is normal size, shape, consistency and nontender                                      Adnexa: normal adnexa in size, nontender and no masses                                      Rectovaginal: Confirms                                      Anus:  normal sphincter tone, no lesions  A: normal gyn exam Right breast calcifications.   History of uterine fibroids.      P: [facility].   pap smear and high risk HPV testing. Patient will make an appointment with Dr. Linna Darner for her medical care and follow up of labs.  return annually or prn     An After Visit Summary was printed and given to the patient.

## 2012-11-22 NOTE — Patient Instructions (Addendum)

## 2012-11-23 ENCOUNTER — Telehealth: Payer: Self-pay | Admitting: *Deleted

## 2012-11-23 ENCOUNTER — Other Ambulatory Visit: Payer: Self-pay | Admitting: Obstetrics and Gynecology

## 2012-11-23 DIAGNOSIS — Z01419 Encounter for gynecological examination (general) (routine) without abnormal findings: Secondary | ICD-10-CM

## 2012-11-23 NOTE — Addendum Note (Signed)
Addended by: Josefa Half on: 11/23/2012 04:36 PM   Modules accepted: Orders

## 2012-11-23 NOTE — Telephone Encounter (Signed)
Left message on CB# of home and cell # to call our office for Mammogram appt. At the [facility]. May 20th, 2014 @ 8:00am.

## 2012-11-23 NOTE — Telephone Encounter (Signed)
PATIENT NOTIFIED OF APPT. MAY 20TH 2014 @8 :00AM [facility].

## 2012-11-23 NOTE — Addendum Note (Signed)
Addended by: Graylon Good on: 11/23/2012 05:23 PM   Modules accepted: Orders

## 2012-12-05 ENCOUNTER — Ambulatory Visit
Admission: RE | Admit: 2012-12-05 | Discharge: 2012-12-05 | Disposition: A | Discharge status: Home / Self Care | Payer: 59 | Source: Ambulatory Visit | Attending: Obstetrics and Gynecology | Admitting: Obstetrics and Gynecology

## 2012-12-05 DIAGNOSIS — R921 Mammographic calcification found on diagnostic imaging of breast: Secondary | ICD-10-CM

## 2012-12-12 ENCOUNTER — Ambulatory Visit (INDEPENDENT_AMBULATORY_CARE_PROVIDER_SITE_OTHER): Payer: 59 | Admitting: Licensed Clinical Social Worker

## 2012-12-12 DIAGNOSIS — F411 Generalized anxiety disorder: Secondary | ICD-10-CM

## 2012-12-12 NOTE — Progress Notes (Signed)
   THERAPIST PROGRESS NOTE  Session Time: 4:00pm-4:50pm  Participation Level: Active  Behavioral Response: Well GroomedAlertAnxious  Type of Therapy: Individual Therapy  Treatment Goals addressed: Coping  Interventions: CBT, DBT, Strength-based, Supportive and Reframing  Summary: Anita Schmidt is a 57 y.o. female who presents with anxious mood and affect. She reports ongoing anxiety related to her job and her future. She remains fearful to make the wrong decision, so she avoids making them all together or allows someone else to influence her. She expresses frustration with herself for being easily influenced. She has recently experienced more panic attacks, one of which was related to TMJ pain. She is very scared of having more panic attacks and states that this is her greatest fear, over loosing everything she has in life. She is able to identify some of her triggers and wants to write a list of ten things she can do when she has a panic attack to prompt her when she cannot think clearly. She has learned that she can retire, but is now fearful of doing this because it requires decision making on her part. She is taking good care of herself and exercises regularly. Her sleep and appetite are wnl.    Suicidal/Homicidal: Nowithout intent/plan  Therapist Response: Assessed patients current functioning and reviewed progress. Reviewed coping strategies. Assessed patients safety and assisted in identifying protective factors.  Reviewed crisis plan with patient. Assisted patient with the expression of fear and anxiety. Reviewed patients self care plan. Assessed progress related to self care. Patients self care is good. Recommend daily exercise, increased socialization and recreation. Used CBT to assist patient with the identification of negative distortions and irrational thoughts. Encouraged patient to verbalize alternative and factual responses which challenge thought distortions. Used DBT to practice  mindfulness, review distraction list and improve distress tolerance skills. Used motivational interviewing to assist and encourage patient through the change process. Explored patients barriers to change.   Plan: Return again in three weeks.  Diagnosis: Axis I: Generalized Anxiety Disorder    Axis II: Deferred    Amalio Loe, LCSW 12/12/2012

## 2013-01-08 ENCOUNTER — Ambulatory Visit (INDEPENDENT_AMBULATORY_CARE_PROVIDER_SITE_OTHER): Payer: 59 | Admitting: Licensed Clinical Social Worker

## 2013-01-08 DIAGNOSIS — F411 Generalized anxiety disorder: Secondary | ICD-10-CM

## 2013-01-08 NOTE — Progress Notes (Signed)
   THERAPIST PROGRESS NOTE  Session Time: 4:00pm-4:50pm  Participation Level: Active  Behavioral Response: Well GroomedAlertAnxious and Depressed  Type of Therapy: Individual Therapy  Treatment Goals addressed: Coping  Interventions: CBT, Motivational Interviewing, Strength-based, Supportive and Reframing  Summary: Anita Schmidt is a 57 y.o. female who presents with anxious mood and tearful affect. She reports increased crying spells and processes her frustration with her mood lability. She questions if she is depressed and wonders why she does the things she does. She went to look at houses over the weekend because her neighbor constantly smokes and it affects her ability to enjoy her outside space. She is unhappy at her job and feels unfulfilled. She questions what will make her happy and she looks back on her life and does not understand how she used to be very brave and enjoyed taking risks. She engages in negative self talk but is developing an improved awareness of it.    Suicidal/Homicidal: Nowithout intent/plan  Therapist Response: Assessed patients current functioning and reviewed progress. Reviewed coping strategies. Assessed patients safety and assisted in identifying protective factors.  Reviewed crisis plan with patient. Assisted patient with the expression of fear. Reviewed patients self care plan. Assessed progress related to self care. Patients self care is good. Recommend daily exercise, increased socialization and recreation. Used CBT to assist patient with the identification of negative distortions and irrational thoughts. Encouraged patient to verbalize alternative and factual responses which challenge thought distortions. Used DBT to practice mindfulness, review distraction list and improve distress tolerance skills. Used motivational interviewing to assist and encourage patient through the change process. Explored patients barriers to change.   Plan: Return again in two  weeks.  Diagnosis: Axis I: Anxiety Disorder NOS    Axis II: No diagnosis    Shadrack Brummitt, LCSW 01/08/2013

## 2013-01-16 ENCOUNTER — Ambulatory Visit (INDEPENDENT_AMBULATORY_CARE_PROVIDER_SITE_OTHER): Payer: 59 | Admitting: Licensed Clinical Social Worker

## 2013-01-16 DIAGNOSIS — F411 Generalized anxiety disorder: Secondary | ICD-10-CM

## 2013-01-16 NOTE — Progress Notes (Signed)
   THERAPIST PROGRESS NOTE  Session Time: 4:00pm-4:50pm  Participation Level: Active  Behavioral Response: Well GroomedAlertAnxious  Type of Therapy: Individual Therapy  Treatment Goals addressed: Coping  Interventions: CBT, Motivational Interviewing, Strength-based, Supportive and Reframing  Summary: Anita Schmidt is a 57 y.o. female who presents with anxious mood and affect. She reports continued anxiety about her life and her future. She has been contemplating returning to school for a PhD, and processes her thoughts about this. She misses school and the environment of learning. she is very unhappy at her job and is crying often. She feels uncomfortable when there is conflict and her boss raises his voice. She wants to avoid conflict at all costs. She discusses her response as one of dissociating to a small extent and not responding at all. She has not followed through on goals set last week because she got too busy. She is trying to find what interests her, but feels uncertain and lost. She realizes she has been glamorizing her past because she does not enjoy her present life.   Suicidal/Homicidal: Nowithout intent/plan  Therapist Response: Assessed patients current functioning and reviewed progress. Reviewed coping strategies. Assessed patients safety and assisted in identifying protective factors.  Reviewed crisis plan with patient. Assisted patient with the expression of fear. Reviewed patients self care plan. Assessed progress related to self care. Patients self care is good. Recommend daily exercise, increased socialization and recreation. Used CBT to assist patient with the identification of negative distortions and irrational thoughts. Encouraged patient to verbalize alternative and factual responses which challenge thought distortions. Used DBT to practice mindfulness, review distraction list and improve distress tolerance skills. Used motivational interviewing to assist and encourage  patient through the change process. Explored patients barriers to change.   Plan: Return again in two weeks.  Diagnosis: Axis I: Generalized Anxiety Disorder    Axis II: No diagnosis    Irene Mitcham, LCSW 01/16/2013

## 2013-03-29 ENCOUNTER — Ambulatory Visit (HOSPITAL_COMMUNITY): Payer: Self-pay | Admitting: Licensed Clinical Social Worker

## 2013-04-12 ENCOUNTER — Ambulatory Visit (HOSPITAL_COMMUNITY): Payer: Self-pay | Admitting: Licensed Clinical Social Worker

## 2013-04-26 ENCOUNTER — Telehealth: Payer: Self-pay

## 2013-04-26 NOTE — Telephone Encounter (Signed)
Medication and allergies: done (not taking in medications)  Pharmacy updated, uses [facility] on [address] for 90 day supply Pharmacy updated, uses [facility] [address] for [facility]  HM UTD: Yes Immunizations due: Refused flu vaccine  A/P:  Last:  PAP:   UTD 11/2012         MMG: UTD 11/2012 Dexa: n/a CCS: 02/2010  To Discuss with Provider: Should she be taking a multivitamin? Spot on nose; concerned because she cannot get in to dermatology for awhile.

## 2013-04-26 NOTE — Telephone Encounter (Signed)
LM for CB  HM reviewed. Due as noted:

## 2013-04-26 NOTE — Telephone Encounter (Deleted)
Medication and allergies:  Pharmacy updated, uses no info for 90 day supply Pharmacy updated, uses no info for local pharmacy  HM UTD: yes Immunizations due: flu vaccine  A/P: PAP:    UTD   Last: 11/2012 MMG: UTD Last  11/2012 Dexa: na Last: CCS:   UTD Last: 02/2010 DM: ? HTN: ? Lipids: ?  To Discuss with Provider:

## 2013-04-27 ENCOUNTER — Encounter: Payer: Self-pay | Admitting: Internal Medicine

## 2013-04-27 ENCOUNTER — Ambulatory Visit (INDEPENDENT_AMBULATORY_CARE_PROVIDER_SITE_OTHER): Payer: 59 | Admitting: Internal Medicine

## 2013-04-27 VITALS — BP 157/91 | HR 67 | Temp 98.8°F | Ht 66.75 in | Wt 130.4 lb

## 2013-04-27 DIAGNOSIS — C4491 Basal cell carcinoma of skin, unspecified: Secondary | ICD-10-CM | POA: Insufficient documentation

## 2013-04-27 DIAGNOSIS — K5730 Diverticulosis of large intestine without perforation or abscess without bleeding: Secondary | ICD-10-CM | POA: Insufficient documentation

## 2013-04-27 DIAGNOSIS — Z0000 Encounter for general adult medical examination without abnormal findings: Secondary | ICD-10-CM

## 2013-04-27 NOTE — Patient Instructions (Signed)
Please obtain results of office labs (Fax [phone]). I anticipate @ least  iron panel & TSH will be needed. Use  Aveeno Daily  Moisturizing Lotion  twice a day  for the nasal lesion until seen by Derm. Bathe with moisturizing liquid soap , not bar soap.

## 2013-04-27 NOTE — Progress Notes (Signed)
  Subjective:    Patient ID: Anita Schmidt, female    DOB: 25-Feb-1956, 57 y.o.   MRN: [id number]  HPI  She is here for a physical;acute issues include new lesion on nose . PMH of Mohs for basal cell of nose.     Review of Systems She is on a heart healthy diet; she exercises as jogging/walking> 30 minutes 6-7 times per week without symptoms. Specifically she denies chest pain, palpitations, dyspnea, or claudication.  Family history is negativee for premature coronary disease . Advanced cholesterol testing reveals her LDL goal is less than 100; ideally < 70.. No statin to date.  Recent labs @ job "excellent"; LDL 89. BP 126/76 @ home; it rises with stress.     Objective:   Physical Exam Gen.: Thin but healthy and well-nourished in appearance. Alert, appropriate and cooperative throughout exam. Appears younger than stated age  Head: Normocephalic without obvious abnormalities  Eyes: No corneal or conjunctival inflammation noted. Pupils equal round reactive to light and accommodation.  Extraocular motion intact.  Ears: External  ear exam reveals no significant lesions or deformities. Canals clear .TMs normal. Hearing is grossly normal bilaterally. Nose: External nasal exam reveals no deformity or inflammation. Nasal mucosa are pink and moist. No lesions or exudates noted.   Mouth: Oral mucosa and oropharynx reveal no lesions or exudates. Teeth in good repair. Neck: No deformities, masses, or tenderness noted. Range of motion normal. Thyroid slightly irregular contour w/o nodules. ? L cervical rib Lungs: Normal respiratory effort; chest expands symmetrically. Lungs are clear to auscultation without rales, wheezes, or increased work of breathing. Heart: Normal rate and rhythm. Normal S1 and S2. No gallop, click, or rub. S4 with slurring; no murmur. Abdomen: Bowel sounds normal; abdomen soft and nontender. No masses, organomegaly or hernias noted. Genitalia: As per Gyn                                   Musculoskeletal/extremities: No deformity or scoliosis noted of  the thoracic or lumbar spine.  No clubbing, cyanosis, edema, or significant extremity  deformity noted. Range of motion normal .Tone & strength  Normal. Joints normal . Nail health good. Able to lie down & sit up w/o help. Negative SLR bilaterally Vascular: Carotid, radial artery, dorsalis pedis and  posterior tibial pulses are full and equal. No bruits present. Neurologic: Alert and oriented x3. Deep tendon reflexes symmetrical and normal.  Gait normal   .        Skin: Intact without suspicious lesions or rashes. Minimal erythema bridge of nose Lymph: No cervical, axillary lymphadenopathy present. Psych: Mood and affect are normal. Normally interactive                                                                                        Assessment & Plan:  #1 comprehensive physical exam; no acute findings #2 ? Nasal lesion; to see Derm  Plan: see Orders  & Recommendations. Obtain results from job

## 2013-05-04 ENCOUNTER — Telehealth: Payer: Self-pay | Admitting: Internal Medicine

## 2013-05-04 NOTE — Telephone Encounter (Signed)
Patient faxed her blood test results that she had drawn at at her job on Monday. She wants to make sure Dr. Linna Darner has received and wants to know if he would suggest drawing any additional tests. Please advise.

## 2013-05-09 NOTE — Telephone Encounter (Signed)
Spoke with patient about her test results being normal. Patient verbalized understanding.

## 2013-05-09 NOTE — Telephone Encounter (Signed)
   I reviewed the results; they are very good. No additional studies needed. Monitor in 12 months.

## 2013-05-09 NOTE — Telephone Encounter (Signed)
Please advise if you have seen these results.

## 2013-05-22 ENCOUNTER — Other Ambulatory Visit: Payer: Self-pay | Admitting: Dermatology

## 2013-05-23 ENCOUNTER — Ambulatory Visit (INDEPENDENT_AMBULATORY_CARE_PROVIDER_SITE_OTHER): Payer: 59 | Admitting: Internal Medicine

## 2013-05-23 ENCOUNTER — Encounter: Payer: Self-pay | Admitting: Internal Medicine

## 2013-05-23 VITALS — BP 124/78 | HR 72 | Temp 98.0°F | Resp 13 | Wt 133.0 lb

## 2013-05-23 DIAGNOSIS — R030 Elevated blood-pressure reading, without diagnosis of hypertension: Secondary | ICD-10-CM

## 2013-05-23 DIAGNOSIS — F411 Generalized anxiety disorder: Secondary | ICD-10-CM

## 2013-05-23 MED ORDER — PROPRANOLOL HCL 10 MG PO TABS: ORAL_TABLET | ORAL | Status: DC

## 2013-05-23 MED ORDER — DULOXETINE HCL 30 MG PO CPEP: 30.0000 mg | ORAL_CAPSULE | Freq: Every day | ORAL | Status: DC

## 2013-05-23 NOTE — Patient Instructions (Signed)
Your next office appointment will be determined based upon response to medication(s). Please report any significant change in your symptoms. Minimal Blood Pressure Goal= AVERAGE < 140/90;  Ideal is an AVERAGE < 135/85. This AVERAGE should be calculated from @ least 5-7 BP readings taken @ different times of day on different days of week. You should not respond to isolated BP readings , but rather the AVERAGE for that week .Please bring your  blood pressure cuff to office visits to verify that it is reliable.It  can also be checked against the blood pressure device at the pharmacy.

## 2013-05-23 NOTE — Assessment & Plan Note (Signed)
4 options discussed Cymbalta 30 mg

## 2013-05-23 NOTE — Assessment & Plan Note (Signed)
Low dose Inderal prn

## 2013-05-23 NOTE — Progress Notes (Signed)
  Subjective:    Patient ID: Anita Schmidt, female    DOB: [birth year], 57 y.o.   MRN: [id number]  HPI Blood pressure range 103/61-160/100 in context of job related anxiety  Not on anti hypertemsive medication. Some  Lightheadedness & pounding when she paniced unable to find exit @ [employer]. PMH of panic attacks  & claustrophobia.No hx of constellation of headache,chest pain, flushing, and diarrhea. CVE as walking 30 minutes daily .      Review of Systems  Significant headaches, epistaxis, chest pain, palpitations, exertional dyspnea, claudication, paroxysmal nocturnal dyspnea, or edema absent.        Objective:   Physical Exam Thin but healthy and well-nourished & in no acute distress  No lid lag Thyroid :slightly irregular contour  No carotid bruits are present.No neck pain distention present at 10 - 15 degrees.  Heart rhythm and rate are normal with no significant murmurs or gallops.  Chest is clear with no increased work of breathing  There is no evidence of aortic aneurysm or renal artery bruits  Abdomen soft with no organomegaly or masses. No HJR  No clubbing, cyanosis or edema present.  Pedal pulses are intact   No ischemic skin changes are present . Nails healthy w/o onycholysis  Alert and oriented. Strength, tone, DTRs reflexes normal. No tremor          Assessment & Plan:  #1 elevated BP w/o dx of HTN #2 anxiety with panic attacks Plan:The pathophysiology of neurotransmitter deficiency was discussed along with the benefits and potential adverse effects of SSRI therapy. Inderal prn. Cymbalta discussed but declined

## 2013-05-24 ENCOUNTER — Ambulatory Visit (INDEPENDENT_AMBULATORY_CARE_PROVIDER_SITE_OTHER): Payer: 59 | Admitting: Licensed Clinical Social Worker

## 2013-05-24 DIAGNOSIS — F411 Generalized anxiety disorder: Secondary | ICD-10-CM

## 2013-05-24 NOTE — Progress Notes (Signed)
   THERAPIST PROGRESS NOTE  Session Time: 9:30am-10:20am  Participation Level: Active  Behavioral Response: Well GroomedAlertAnxious  Type of Therapy: Individual Therapy  Treatment Goals addressed: Coping  Interventions: CBT, Strength-based, Supportive and Reframing  Summary: DEJANAE HELSER is a 57 y.o. female who presents with anxious mood and affect. She returns to treatment after a long absence due to a recent increase in anxiety resulting in high blood pressure. She describes a recent work related trip to [facility] and how she experienced panic attacks during the trip. She is upset with herself over this and wants to learn how to control her anxious automatic thoughts. Completed a thought record with her to demonstrate use of this tool. Recommend patient purchase and begin use of Anxiety and Phobia workbook. Her sleep and appetite are fair, but often disrupted by her anxiety.   Suicidal/Homicidal: Nowithout intent/plan  Therapist Response: Assessed patients current functioning and reviewed progress. Reviewed coping strategies. Assessed patients safety and assisted in identifying protective factors.  Reviewed crisis plan with patient. Assisted patient with the expression of anxiety. Reviewed patients self care plan. Assessed progress related to self care. Patients self care is good. Recommend daily exercise, increased socialization and recreation. Used CBT to assist patient with the identification of negative distortions and irrational thoughts. Encouraged patient to verbalize alternative and factual responses which challenge thought distortions. Used motivational interviewing to assist and encourage patient through the change process. Explored patients barriers to change.   Plan: Return again in four weeks.  Diagnosis: Axis I: Generalized Anxiety Disorder    Axis II: No diagnosis    Aiesha Leland, LCSW 05/24/2013

## 2013-05-25 ENCOUNTER — Ambulatory Visit: Payer: Self-pay | Admitting: Internal Medicine

## 2013-05-25 ENCOUNTER — Telehealth: Payer: Self-pay | Admitting: *Deleted

## 2013-05-25 DIAGNOSIS — Z0279 Encounter for issue of other medical certificate: Secondary | ICD-10-CM

## 2013-05-25 NOTE — Telephone Encounter (Signed)
Wellness form completed and placed up front, pt notified.

## 2013-06-04 ENCOUNTER — Telehealth: Payer: Self-pay | Admitting: *Deleted

## 2013-06-04 NOTE — Telephone Encounter (Signed)
06/04/2013  Copies made of completed Screening Results Appeal Form.  Sent to batch and sent to billing.  bw

## 2013-06-07 ENCOUNTER — Ambulatory Visit (INDEPENDENT_AMBULATORY_CARE_PROVIDER_SITE_OTHER): Payer: 59 | Admitting: Licensed Clinical Social Worker

## 2013-06-07 DIAGNOSIS — F411 Generalized anxiety disorder: Secondary | ICD-10-CM

## 2013-06-07 NOTE — Progress Notes (Signed)
   THERAPIST PROGRESS NOTE  Session Time: 4:00pm-4:50pm  Participation Level: Active  Behavioral Response: Well GroomedAlertAnxious  Type of Therapy: Individual Therapy  Treatment Goals addressed: Coping  Interventions: CBT, Strength-based, Supportive and Reframing  Summary: Anita Schmidt is a 57 y.o. female who presents with anxious mood and affect. She reports ongoing depression and frustration. She has been reading the CBT workbook for anxiety, but did not complete any exercises. She explains that she does not like to do this. She explores her fear and endorses feeling as though she is holding back in her life. She is tearful when processing her anxiety and how it interferes with her life. Her sleep is disrupted and her appetite is wnl.   Suicidal/Homicidal: Nowithout intent/plan  Therapist Response: Assessed patients current functioning and reviewed progress. Reviewed coping strategies. Assessed patients safety and assisted in identifying protective factors.  Reviewed crisis plan with patient. Assisted patient with the expression of anxiety. Reviewed patients self care plan. Assessed progress related to self care. Patients self care is good. Recommend daily exercise, increased socialization and recreation. Used CBT to assist patient with the identification of negative distortions and irrational thoughts. Encouraged patient to verbalize alternative and factual responses which challenge thought distortions.   Plan: Return again in two weeks.  Diagnosis: Axis I: Generalized Anxiety Disorder    Axis II: No diagnosis    Emiel Kielty, LCSW 06/07/2013

## 2013-07-03 ENCOUNTER — Encounter: Payer: Self-pay | Admitting: Internal Medicine

## 2013-07-03 ENCOUNTER — Telehealth: Payer: Self-pay | Admitting: *Deleted

## 2013-07-03 ENCOUNTER — Ambulatory Visit (INDEPENDENT_AMBULATORY_CARE_PROVIDER_SITE_OTHER): Payer: 59 | Admitting: Internal Medicine

## 2013-07-03 VITALS — BP 130/80 | HR 79 | Temp 99.1°F | Resp 16 | Wt 130.1 lb

## 2013-07-03 DIAGNOSIS — J309 Allergic rhinitis, unspecified: Secondary | ICD-10-CM

## 2013-07-03 DIAGNOSIS — J069 Acute upper respiratory infection, unspecified: Secondary | ICD-10-CM

## 2013-07-03 DIAGNOSIS — R6889 Other general symptoms and signs: Secondary | ICD-10-CM

## 2013-07-03 MED ORDER — FLUTICASONE PROPIONATE 50 MCG/ACT NA SUSP: 2.0000 | Freq: Every day | NASAL | Status: DC

## 2013-07-03 NOTE — Patient Instructions (Signed)
Zicam Melts or Zinc lozenges ; vitamin C 2000 mg daily; & Echinacea for 4-7 days. Report fever, exudate("pus") or progressive pain.Plain Mucinex (NOT D) for thick secretions ;force NON dairy fluids .   Nasal cleansing in the shower as discussed with lather of mild shampoo.After 10 seconds wash off lather while  exhaling through nostrils. Make sure that all residual soap is removed to prevent irritation.  Nasacort AQ OTC  OR Fluticasone 1 spray in each nostril twice a day as needed. Use the "crossover" technique into opposite nostril spraying toward opposite ear @ 45 degree angle, not straight up into nostril.  Use a Neti pot daily only  as needed for significant sinus congestion; going from open side to congested side . Plain Allegra (NOT D )  160 daily , Loratidine 10 mg , OR Zyrtec 10 mg @ bedtime  as needed for itchy eyes & sneezing.

## 2013-07-03 NOTE — Progress Notes (Signed)
Pre visit review using our clinic review tool, if applicable. No additional management support is needed unless otherwise documented below in the visit note. 

## 2013-07-03 NOTE — Telephone Encounter (Signed)
Patient called the office with c/o possible sinus infection. Appt made for this afternoon with Dr. Alwyn Ren and refill for flonase sent to [facility] in [place]

## 2013-07-03 NOTE — Progress Notes (Signed)
   Subjective:    Patient ID: Anita Schmidt, female    DOB: [birth year], 57 y.o.   MRN: [id number]  HPI   Symptoms began 12/14 is postnasal drainage and sore throat. She had severe frontal sinus area discomfort.  She uses Nasacort last night; she questions whether it has been of any benefit  She has had sneezing , chills & sweats. She is now developing chest congestion. Cough is non productive  No flu shot shot in 2014    Review of Systems  Despite the chills she has not had definite fever .  She is not having facial pain, nasal purulence, otic pain, or otic discharge.  She denies significant arthralgias or myalgias.       Objective:   Physical Exam General appearance:appears tired but in good health ;well nourished; no acute distress or increased work of breathing is present.  No  lymphadenopathy about the head, neck, or axilla noted.   Eyes: No conjunctival inflammation or lid edema is present. .  Ears:  External ear exam shows no significant lesions or deformities.  Otoscopic examination reveals clear canals, tympanic membranes are intact bilaterally without bulging, retraction, inflammation or discharge.  Nose:  External nasal examination shows no deformity or inflammation. Nasal mucosa are boggy ,R> L . No septal dislocation or deviation.No obstruction to airflow.  Slight hyponasal speech  Oral exam: Dental hygiene is good; lips and gums are healthy appearing.There is no oropharyngeal erythema or exudate noted.   Neck:  No deformities,  masses, or tenderness noted.   Supple with full range of motion without pain.   Heart:  Normal rate and regular rhythm. S1 and S2 normal without gallop, murmur, click, rub or other extra sounds.   Lungs:Chest clear to auscultation; no wheezes, rhonchi,rales ,or rubs present.No increased work of breathing.    Extremities:  No cyanosis, edema, or clubbing  noted    Skin: Warm & dry.         Assessment & Plan:  #1viral URI See  orders

## 2013-07-05 ENCOUNTER — Ambulatory Visit (HOSPITAL_COMMUNITY): Payer: Self-pay | Admitting: Licensed Clinical Social Worker

## 2013-07-26 ENCOUNTER — Ambulatory Visit (HOSPITAL_COMMUNITY): Payer: Self-pay | Admitting: Licensed Clinical Social Worker

## 2013-08-07 ENCOUNTER — Ambulatory Visit: Payer: Self-pay | Admitting: Internal Medicine

## 2013-08-07 ENCOUNTER — Ambulatory Visit (INDEPENDENT_AMBULATORY_CARE_PROVIDER_SITE_OTHER): Payer: 59 | Admitting: Licensed Clinical Social Worker

## 2013-08-07 DIAGNOSIS — F411 Generalized anxiety disorder: Secondary | ICD-10-CM

## 2013-08-07 NOTE — Progress Notes (Signed)
   THERAPIST PROGRESS NOTE  Session Time: 2:00pm-2:50pm  Participation Level: Active  Behavioral Response: Well GroomedAlertAnxious  Type of Therapy: Individual Therapy  Treatment Goals addressed: Coping  Interventions: CBT, Strength-based, Supportive and Reframing  Summary: Anita Schmidt is a 58 y.o. female who presents with anxious mood and congruent affect. She reports having a very difficult two weeks where she describes feeling severely depressed. She reports that she panicked and thought she would have to quit her job and start taking medication which she does want to take. She is now feeling better after realizing that she was upset over her mother leaving after a visit. Patient is tearful when processing her fear of her parents death and how she feels responsible to care for them. She processes her sadness and grief over the loss of her childhood in the way she wanted. She denies feeling depressed anymore, but continues to feel anxious about her life plans.    Suicidal/Homicidal: Nowithout intent/plan  Therapist Response: Assessed patients current functioning and reviewed progress. Reviewed coping strategies. Assessed patients safety and assisted in identifying protective factors.  Reviewed crisis plan with patient. Assisted patient with the expression of anxiety. Reviewed patients self care plan. Assessed progress related to self care. Patients self care is good. Recommend daily exercise, increased socialization and recreation. Used CBT to assist patient with the identification of negative distortions and irrational thoughts. Encouraged patient to verbalize alternative and factual responses which challenge thought distortions. Discussed transition to another provider. Transferring patient to Gracelyn Nurse MSW in [place]. Provided patient with information to call and schedule an appt.   Plan: Return again in three weeks.  Diagnosis: Axis I: Generalized Anxiety  Disorder    Axis II: No diagnosis    Rhythm Gubbels, LCSW 08/07/2013

## 2013-08-14 ENCOUNTER — Ambulatory Visit (INDEPENDENT_AMBULATORY_CARE_PROVIDER_SITE_OTHER): Payer: 59 | Admitting: Internal Medicine

## 2013-08-14 ENCOUNTER — Encounter: Payer: Self-pay | Admitting: Internal Medicine

## 2013-08-14 VITALS — BP 135/84 | HR 70 | Temp 97.9°F | Resp 16 | Wt 135.0 lb

## 2013-08-14 DIAGNOSIS — F39 Unspecified mood [affective] disorder: Secondary | ICD-10-CM

## 2013-08-14 NOTE — Patient Instructions (Addendum)
I recommend a Psychiatrist  consultation to diagnosis condition & determine optimal therapy.

## 2013-08-14 NOTE — Progress Notes (Signed)
   Subjective:    Patient ID: Anita Schmidt, female    DOB: 21-Jul-1955, 58 y.o.   MRN: [id number]  HPI Presents with c/o feeling depressed for approximately 3 weeks. Reports she was sick at Christmas with cold and her mother came to visit.  When her mother left on 07/24/13, she began to feel down and it progressed to the point she felt as if she could have a panic attack later in that week.  States her job is a stressor and she feels anxious in the mornings before going to work, to the point she often physically gags while getting ready.  Jaw pain in last 12 months, which she attributes to clinching her jaws on the drive to work.  Since that time, she reinstated her gym membership and has started swimming after work. Has also seen a life coach therapist, and has begun to receive massages every 2 weeks for relaxation. Eating habits unaffected; sleep often interrupted by 2-3 wakeful moments lasting approximately 5-10 minutes each time.    Review of Systems     Objective:   Physical Exam        Assessment & Plan:

## 2013-08-14 NOTE — Progress Notes (Signed)
Pre-visit discussion using our clinic review tool, as applicable. No additional management support is needed unless otherwise documented below in the visit note.  

## 2013-08-14 NOTE — Progress Notes (Signed)
   Subjective:    Patient ID: Anita Schmidt, female    DOB: 22-May-1956, 58 y.o.   MRN: [id number]  HPI HPI  Presents with c/o feeling depressed for approximately 3 weeks. Reports she was sick at Christmas with cold and her mother came to visit.  When her mother left on 07/24/13, she began to feel down and it progressed to the point she felt as if she could have a panic attack later in that week.  States her job is a stressor and she feels anxious in the mornings before going to work, to the point she often physically gags while getting ready.  Jaw pain in last 12 months, which she attributes to clinching her jaws on the drive to work.  Since that time, she reinstated her gym membership and has started swimming after work. Has also seen a life coach therapist, and has begun to receive massages every 2 weeks for relaxation. Eating habits unaffected; sleep often interrupted by 2-3 wakeful moments lasting approximately 5-10 minutes each time.   Remotely she was prescribed an SSRI. She took one pill. She expresses concerns about taking "drugs"   Review of Systems  She specifically denies any paroxysmal diaphoresis, headache, chest pain, flushing, or diarrhea as significant symptoms.     Objective:   Physical Exam    Gen.:  well-nourished; in no acute distress Eyes: Extraocular motion intact; no lid lag or proptosis ,nystagmus Neck: full ROM; no masses ; thyroid normal  Heart: Normal rhythm and rate without significant murmur, gallop, or extra heart sounds Lungs: Chest clear to auscultation without rales,rales, wheezes Neuro:Deep tendon reflexes are equal and within normal limits; no tremor  Skin: Warm and dry without significant lesions or rashes; no onycholysis Lymphatic: no cervical or axillary LA Psych: Normally communicative and interactive;flat affect clinically.         Assessment & Plan:  #1  ? Affective  disorder;R/O neurotransmitter deficiency  Plan: Psychiatry

## 2013-08-15 LAB — T4, FREE: Free T4: 0.88 ng/dL (ref 0.60–1.60)

## 2013-08-15 LAB — TSH: TSH: 2.77 u[IU]/mL (ref 0.35–5.50)

## 2013-08-15 LAB — T3, FREE: T3 FREE: 3 pg/mL (ref 2.3–4.2)

## 2013-08-20 ENCOUNTER — Telehealth: Payer: Self-pay | Admitting: Internal Medicine

## 2013-08-20 DIAGNOSIS — F418 Other specified anxiety disorders: Secondary | ICD-10-CM

## 2013-08-20 MED ORDER — VENLAFAXINE HCL ER 37.5 MG PO CP24: 37.5000 mg | ORAL_CAPSULE | Freq: Every day | ORAL | Status: DC

## 2013-08-20 NOTE — Telephone Encounter (Signed)
Patient states she cannot get a psychiatric appt until next week and she would like to know if Dr. Linna Darner would be able to prescribe her something. She states he has given her something previously. Pt uses [facility] on [address].

## 2013-08-21 ENCOUNTER — Ambulatory Visit (INDEPENDENT_AMBULATORY_CARE_PROVIDER_SITE_OTHER): Payer: 59 | Admitting: Psychiatry

## 2013-08-21 DIAGNOSIS — F411 Generalized anxiety disorder: Secondary | ICD-10-CM

## 2013-08-21 NOTE — Progress Notes (Signed)
[place] 58 y.o. 08/21/2013   Referred by: Darlyn Chamber, LCSW   PRESENTING PROBLEM  Chief Complaint: Pt is a 58 year old single, caucasian, employed female referred for counseling by Darlyn Chamber who is leaving the practice for the treatment of anxiety. 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 Pt describes the environment as "negative and unhealthy". Pt said her boss is a very negative person and Pt feels he shows favoritism to the other employees. Pt describes her co-workers as Engineer, technical sales of her work and Pt has anxiety thinking about her job. Pt is unsure if she wants to stay at her job or leave it, but states she is financially stable enough to not work. Pt describes herself as having high anxiety and a history of panic attacks throughout life that have been managed through holistic approaches, diet, exercise and counseling.  Pt describes herself as "a very sensitive person" and said she thrives in environments that are healthy for her and struggles in stressful environments. Pt said she currently lives alone in a townhouse in [place] and has never married or had children. Pt has a Scientist, water quality and states she is active in yoga and walking. Pt worries about her parents as they age and has anticipatory grief about end of life issues for both of them. Pt has a healthy relationship with both of them and they reside in [place]. Pt said her father sends her $15,000 per year that goes into a retirement account in Pts name.     What are the main stressors in your life right now?  1) Job 2) Anxiety   Describe a brief history of your present symptoms: Pt said she has struggled throughout her life at different points with anxiety. She had difficulty answering if she has depression and could not identify if she is experiencing symptoms of depression even when asked. Pt  said she missed a semester of school when she was 58 years old due to anxiety symptoms that resolved and did not resurface until her twenties where she has been managing them off and on for the past several years.     MENTAL HEALTH HISTORY Pt has been seeing Darlyn Chamber for individual therapy off and on for the past two years and was referred to current writer due to her leaving the practice. Pt denies seeing a psychiatrist but has an appointment on August 29, 2013, next week, with Dr. Candis Schatz. Pt said she does not like the idea of taking medication and would like to look into holistic approaches to manage her wellness. Pt was prescribed Effexor by her PCP, but has not taken it yet out of fears of taking the medicine.   Pt denied any history of thoughts or attempts of suicide and said she does not want to even discuss the possibility of that occuring out of fear that thinking about it could cause her to have the thoughts.  Pt denied any history of inpatient psychiatric hospitalization. Pt denied any substance abuse history.    FAMILY MENTAL HEALTH HISTORY History reviewed. No pertinent family history.    MARITAL STATUS Pt is single, never married and never had children.    LEISURE/RECREATION Describe how patient spends leisure time: Pt enjoys yoga, walking and exercise. Pt believes when she engages in these activities her mental health improves.    SOCIAL AND FAMILY HISTORY Pts parents  are divorced and both live in [place]. Pt describes having a good relationship with both of them. Pt did not discuss any siblings.    EDUCATIONAL BACKGROUND Pt has a Masters Degree.   WORK HISTORY Pt is employed as a Public affairs consultant for a company in [place] where she has worked for the past four years. Pt dislikes her job, feels the environment is very negative and this is Pts primary stressors. Pt said she stays for the salary and benefits.    Conclusion Pt agrees to weekly  counseling to address symptoms of anxiety, explore the possible diagnosis of depression and work on goal of reducing stress associated with her job.  Plan Complete Burns Depression Inventory and Anxiety Inventory at next session, Discuss using Biofeedback as a means to managing anxiety symptoms, explore introducing daily wellness planning for the ongoing management of anxiety symptoms.      Tresa Res, LCSW 08/21/2013

## 2013-08-22 ENCOUNTER — Telehealth: Payer: Self-pay | Admitting: *Deleted

## 2013-08-22 NOTE — Telephone Encounter (Signed)
   Medication will be listed as an allergy. Please provide this information to the specialist when seen.

## 2013-08-22 NOTE — Telephone Encounter (Signed)
Patient phoned this morning & let voicemail message on triage line, stating that newly prescribed effexor, she only took one tablet yesterday morning, was okay, started shaking and had to call 911 at midnight, in a "full blown panic attack".  States that when EMS assessed her, they advised her she had experienced "classic side effects" of the Effexor.  States she is not going to take anymore of the medication.  States she is recooperating at home today.   CB# [phone]

## 2013-08-22 NOTE — Telephone Encounter (Signed)
Spoke with the and informed her of Dr. Clayborn Heron note below.  Pt agreed.//AB/CMA

## 2013-08-23 ENCOUNTER — Encounter: Payer: Self-pay | Admitting: Internal Medicine

## 2013-08-23 ENCOUNTER — Telehealth: Payer: Self-pay | Admitting: Internal Medicine

## 2013-08-23 ENCOUNTER — Ambulatory Visit (INDEPENDENT_AMBULATORY_CARE_PROVIDER_SITE_OTHER): Payer: 59 | Admitting: Internal Medicine

## 2013-08-23 ENCOUNTER — Encounter: Payer: Self-pay | Admitting: General Practice

## 2013-08-23 VITALS — BP 160/88 | HR 77 | Temp 98.4°F | Resp 17 | Wt 134.2 lb

## 2013-08-23 DIAGNOSIS — F411 Generalized anxiety disorder: Secondary | ICD-10-CM

## 2013-08-23 DIAGNOSIS — I10 Essential (primary) hypertension: Secondary | ICD-10-CM

## 2013-08-23 MED ORDER — LORAZEPAM 0.5 MG PO TABS: 0.5000 mg | ORAL_TABLET | Freq: Three times a day (TID) | ORAL | Status: DC | PRN

## 2013-08-23 NOTE — Progress Notes (Signed)
Pre visit review using our clinic review tool, if applicable. No additional management support is needed unless otherwise documented below in the visit note. 

## 2013-08-23 NOTE — Patient Instructions (Signed)
The 24 urine collection is to assess possible increased production of stress hormones (metanephrines and catecholamines).

## 2013-08-23 NOTE — Telephone Encounter (Signed)
Patient has phoned in again, stating she is still out of work secondary to medication reaction.  She has been out of work Tuesday, Wednesday, today and will be tomorrow.  She is requesting a work release note from PCP.  States that if possible, to please mail letter to her home address.  If not, she will try to come by & pick it up.  Please advise.   CB# [phone]

## 2013-08-23 NOTE — Telephone Encounter (Signed)
Patient is calling to see if Dr. Linna Darner can rx her something to help with the shaking she is experiencing. See previous closed encounter. She states that while she is talking on the phone it stop but when she goes back to no activity it begins again. Please advise.

## 2013-08-23 NOTE — Telephone Encounter (Signed)
Appt @ 1:30 today (double book)

## 2013-08-23 NOTE — Progress Notes (Signed)
   Subjective:    Patient ID: Anita Schmidt, female    DOB: 04/19/1956, 58 y.o.   MRN: [id number]  HPI  After taking a dose of Effexor 37.5 mg she experienced a full-blown panic attack with tremor, accelerated hypertension, and nausea. She was seen by EMS but she did not go to the hospital.  Remotely she had taken citalopram and felt that this caused significant nausea.  She has a history of performance anxiety. She does have a prescription for low-dose propranolol.  Has appointment to see Dr. Candis Schatz next week.      Review of Systems  She does not typically have paroxysmal hypertension, headache, or diaphoresis. She has had some sweating at night  She denies a constellation of flushing and diarrhea.  Her blood pressure typically averages 135 range 5 or less. She denies chest pain, palpitations, or dyspnea.     Objective:   Physical Exam  Appears healthy and well-nourished & in no acute distress  No carotid bruits are present.No neck pain distention present at 10 - 15 degrees. Thyroid normal to palpation  Heart rhythm and rate are normal with no significant murmurs or gallops. S4  Chest is clear with no increased work of breathing  There is no evidence of aortic aneurysm or renal artery bruits  Abdomen soft with no organomegaly or masses. No HJR  No clubbing, cyanosis or edema present.  Pedal pulses are intact   No ischemic skin changes are present . Nails healthy. Alert and oriented. Strength, tone, DTRs reflexes normal          Assessment & Plan:  #1 panic attack; rule out pheochromocytoma  Plan: See orders

## 2013-08-24 ENCOUNTER — Encounter: Payer: Self-pay | Admitting: Internal Medicine

## 2013-08-27 ENCOUNTER — Other Ambulatory Visit: Payer: Self-pay | Admitting: *Deleted

## 2013-08-27 ENCOUNTER — Other Ambulatory Visit: Payer: 59

## 2013-08-27 DIAGNOSIS — I10 Essential (primary) hypertension: Secondary | ICD-10-CM

## 2013-08-28 ENCOUNTER — Ambulatory Visit (HOSPITAL_COMMUNITY): Payer: Self-pay | Admitting: Psychiatry

## 2013-08-28 ENCOUNTER — Encounter: Payer: Self-pay | Admitting: Internal Medicine

## 2013-08-28 ENCOUNTER — Telehealth (HOSPITAL_COMMUNITY): Payer: Self-pay | Admitting: Psychiatry

## 2013-08-28 NOTE — Telephone Encounter (Signed)
Writer spoke with Pt and inquired about wellbeing and reason for missing today's scheduled therapy session. Pt said she notified the office yesterday that she would need to cancel because "her anxiety was just too high" and rescheduled for tomorrow, but also felt this appointment would need to be rescheduled. Pt states she has called in sick to work the past two days for high anxiety and inability to function at work. Writer encouraged Pt to attend sessions, especially given her severe anxiety symptoms. Writer also reminded Pt of the cancellation and no show policy.   Writer spoke with Maudie Mercury (receptionist) and was informed that Pt called yesterday to move her appointment to a later time in the day, but did not cancel the appointment and therefore it will count as a "no show" for today. Kim stated Pt did not schedule an appointment for tomorrow.   This will be Pts first no show on record with this Probation officer.

## 2013-08-29 ENCOUNTER — Encounter: Payer: Self-pay | Admitting: Internal Medicine

## 2013-08-31 ENCOUNTER — Encounter: Payer: Self-pay | Admitting: Internal Medicine

## 2013-08-31 LAB — METANEPHRINES, URINE, 24 HOUR
Metaneph Total, Ur: 506 mcg/24 h (ref 224–832)
Metanephrines, Ur: 135 mcg/24 h (ref 90–315)
NORMETANEPHRINE 24H UR: 371 ug/(24.h) (ref 122–676)

## 2013-09-04 ENCOUNTER — Ambulatory Visit (HOSPITAL_COMMUNITY): Payer: Self-pay | Admitting: Psychiatry

## 2013-09-05 ENCOUNTER — Telehealth (HOSPITAL_COMMUNITY): Payer: Self-pay

## 2013-09-05 NOTE — Telephone Encounter (Signed)
error 

## 2013-09-10 ENCOUNTER — Telehealth (HOSPITAL_COMMUNITY): Payer: Self-pay

## 2013-09-12 ENCOUNTER — Ambulatory Visit (INDEPENDENT_AMBULATORY_CARE_PROVIDER_SITE_OTHER): Payer: 59 | Admitting: Psychiatry

## 2013-09-12 DIAGNOSIS — F411 Generalized anxiety disorder: Secondary | ICD-10-CM

## 2013-09-12 NOTE — Progress Notes (Signed)
THERAPIST PROGRESS NOTE  Session Time: 4:00-4:50 pm  Participation Level: Active  Behavioral Response: Neat and Well GroomedAlertAnxious  Type of Therapy: Individual Therapy  Treatment Goals addressed: Created treatment plan goals and taught intro to biofeedback.  Interventions: Treatment plan goals and biofeedback.     INDIVIDUAL TREATMENT PLAN       Date of Admission:  08-21-13 Date of Treatment Plan: 09-12-13      Service: [x]  Group  [x]  Individual [x]  Comprehensive []  Revised due to: []  Change in Diagnosis     []  Change in Service     []  Expiration of Previous Treatment Plan  Diagnosis:  Generalized Anxiety Disorder  Recommended Treatment:  [x]  Individual Therapy  []  Family Therapy  [x]  Couples Therapy  [x]  Chemical Dependency (CD-IOP) group therapy 3x weekly and 1:1 individual therapy as required  []  Mental Health (MH-IOP) group therapy 5x weekly and 1:1 individual therapy as required   Possible Negative Outcomes of Treatment: Symptoms of mental illness may increase and changes in relationship can occur during the course of treatment.  Client's Strengths (What are the strengths and resources that will help clients move towards their goals):  Employed, open minded, financially stable, parental support   Personal Recovery Goal(s)/Client's Goals for Treatment (use client's words):  To feel less anxious and better manage anxiety and job stress    Objective Behavioral Criteria for Discharge: Client will maintain stability in the community as demonstrated by the following:   No suicidal behaviors for 3 months.   No hospitalizations or emergency room visits for psychological reasons for 3 months.   No SIB behaviors requiring medical attention for 3 months.   Emotional regulation by reporting distress at an average of 5/10 or below for 3 months.   Demonstration of healthy community supports by initiating peer contact at least twice weekly separate from the treatment  environment.   Agreed-upon transition plan to a less restrictive setting.  INFORMED CONSENT TO TREATMENT. I acknowledge that I have been informed of and am able to understand my diagnosis, the nature and purpose of the proposed treatment, the risks and benefits of the proposed treatment, the possible negative outcomes of and possible alternatives to the proposed treatment, the probability that the proposed treatment will be successful, and the prognosis if I choose not to receive the treatment. Further, I have been informed of the extent and limits of confidentiality of treatment information. I understand the risks, benefits, possible negative outcomes of and alternatives to this proposed treatment, and my signature below verifies that I have actively participated in the development of my treatment plan and that I am willingly and voluntarily agreeing to the treatment outlined in this plan.   Assessed Needs (Problem) Client's Goals (what client will do)   Interventions (what staff will do)   1. Anxiety    o Improve ability to manage anxiety symptoms and better handle stress.  o Manage thoughts and worrisome thinking that contribute to feelings of anxiety.  o Provide education about anxiety to help patient identify causes, symptoms and triggers. o Facilitate problem solution skills to help patient identify and implement options to resolve stressors.  o Teach coping skills to manage anxiety symptoms such as; biofeedback, guided imagery, meditation and aromatherapy. o Use CBT to identify and change anxiety provoking thought patterns. o Teach Biofeedback skills to increase Pts ability to manage anxiety symptoms.     2. Job Stress     o Manage job stress at work o Explore other  options for either leaving job or finding new employment  o Explore causes and triggers for job stress. o Process feelings of sadness and helplessness associated with job dissatisfaction. o Facilitate problem solution  skills to help Pt decide plans for staying at current job.  o Teach CBT skills to help Pt change thoughts and behaviors associated with work stressors.  o Teach DBT skills to help manage distress at work.   3. Coming to terms with death of parents    o Learn ways to prepare for the loss of my parents as a natural projection of life events. o Find other activities and life purpose apart from my parents  o Process fears associated with the anticipatory loss of parents. o Use CBT to challenge distorted thoughts about death and parents. o Assist Pt in finding other activities, hobbies and interests outside of spending time with parents.    I have [] received [] declined a copy of this treatment plan.  Client: Date: Guardian/Parent: Date:   Therapist: Date: Licensed Provider (if applicable): Date:    Six month review:   I have reviewed this treatment plan and consider it still valid. Client: Date: Guardian/Parent: Date:      Suicidal/Homicidal: No  Therapist Response: Assessed overall level of functioning, created treatment plan goals and introduced pt to the basic coherence coach on the heartmath program to assess overall level of stress and teach a baisic anxiety management breathing technique.   Plan: Return again in 1 weeks. Teach heartmath neutral technque  Diagnosis: Axis I: Generalized Anxiety Disorder      Tresa Res, LCSW 09/12/2013

## 2013-09-18 ENCOUNTER — Ambulatory Visit (INDEPENDENT_AMBULATORY_CARE_PROVIDER_SITE_OTHER): Payer: 59 | Admitting: Psychiatry

## 2013-09-18 ENCOUNTER — Encounter (HOSPITAL_COMMUNITY): Payer: Self-pay | Admitting: Psychiatry

## 2013-09-18 DIAGNOSIS — F411 Generalized anxiety disorder: Secondary | ICD-10-CM

## 2013-09-18 NOTE — Progress Notes (Signed)
   THERAPIST PROGRESS NOTE  Session Time: 8:00-8:50 am  Participation Level: Active  Behavioral Response: CasualAlertAnxious  Type of Therapy: Individual Therapy  Treatment Goals addressed: Anxiety and Job Stress  Interventions: Other: Biofeedback and Progressive Muscle Relaxation  Summary: Anita Schmidt is a 57 y.o. female who presents with severe anxious mood and affect. Pt started the session by asking the writer to fill out FMLA forms to excuse her full time for work. Pt agreed to have her nuerofeedback counselor do them next week at their session since they have instructed Pt to not take any psychotropic medications during their treatments. Pt said she has not been to work last week or this week and does not plan to return given the high state of her anxiety symptoms. Pt is fearful of losing her job but does not want to quit out of fears of losing her health insurance. Pt said she knows she needs to make a decision to either return or find other employment, but her anxiety had been so high that she has been unable to focus attention on either options.  Pt said she has negative, anxiety producing thoughts that she ruminates in during the day. When she is distracted by other activities, the thoughts diminish and her anxiety reduces. Pt denied having many intentinal anxiety reducing coping skills other than yoga. Pt said she did practice the biofeedback neutral breathing and found it very helpful throughout the week. Pt rated her current anxiety level in the session at an 8/10 (with 10 being high anxiety). Pt was open to learning more tools to manage her anxiety and used part of the session to combine the heartmath breathing technique with a Progressive Muscle Relaxation Meditation. Pt reported a decrease in her anxiety down to a 4/10 at the end of the exercise and agreed to use both techniques throughout the week.   Suicidal/Homicidal: No  Therapist Response: Assessed overall level of anxiety  per Pt self report and behaviors during session. Reviewed homework of using biofeedback daily, explored decisions regarding job stress, gathered information on Pts nuerofeedback consultation. Educated on how negative thinking increases anxiety and taught Pt a relaxation technique using PMR.   Plan: Return again in 1 week. Follow up on using biofeedback twice daily and PMR in the evenings. Add aromatherapy at next session.  Diagnosis: Axis I: Generalized Anxiety Disorder      Tresa Res, LCSW 09/18/2013

## 2013-09-26 ENCOUNTER — Ambulatory Visit (INDEPENDENT_AMBULATORY_CARE_PROVIDER_SITE_OTHER): Payer: 59 | Admitting: Psychiatry

## 2013-09-26 DIAGNOSIS — F411 Generalized anxiety disorder: Secondary | ICD-10-CM

## 2013-09-27 ENCOUNTER — Encounter (HOSPITAL_COMMUNITY): Payer: Self-pay | Admitting: Psychiatry

## 2013-09-27 NOTE — Progress Notes (Signed)
   THERAPIST PROGRESS NOTE  Session Time: 4:00-4:50 pm  Participation Level: Active  Behavioral Response: CasualAlertAnxious  Type of Therapy: Individual Therapy  Treatment Goals addressed: Anxiety and Job stress  Interventions: CBT, Solution Focused and Supportive  Summary: DOLL FRAZEE is a 58 y.o. female who presents with severe anxious mood and tearful affect. Pt said she is hopeful about the three treatments of Neuro feedback she has had so far and gave an update on the work they are doing together. Pt said the results indicated high anxiety with minimal depressed mood. Pt said she continues to be severely anxious to the point of being unable to attend work. Pt said her counselor at the Neuro feedback center wrote Pt off from work for this week due to the severity of her anxiety symptoms. Pt talked about how independent she used to be traveling the country with minimal income and participating in artistic events. Pt said she does not feel her life is fulfilling as it is now. She misses companionship and feels lonely. She mentioned not feeling purposeful in her current job and wanting to do something she finds more fulfilling, but having a difficult time considering the possibility of retirement even though she said she has enough money saved up in retirement to retire at any time. Pt said she has a lot of fears for her future.  Pt said she continues to use her meditations and biofeedback and is finding them helpful. She currently uses them during structured times and agreed to start using the biofeedback during times of panic attacks. Pt said she is not sleeping well and wakes up feeling nauseous and gagging from her anxiety. Pt is hopeful that counseling will help her manage these symptoms. Pt agreed to do more things outside of her home this week with friends to give her life some of the fun she said she is missing and also to distract away from thinking and focusing on her anxiety.    Suicidal/Homicidal: No  Therapist Response: Assessed overall level of anxiety per Pt self report, gathered an update about Pts Neuro Feedback treatments, processed stress with job, reviewed coping skills to manage anxiety, explored behavioral changes impacting anxiety level and alternate ways of behaving during the week to give her more fun, encouraged continued use of mediation and biofeedback for anxiety management.   Plan: Return again in 1 weeks. Continue teaching biofeedback and providing supportive counseling and CBT work to make changes to life and explore new career choices.   Diagnosis: Axis I: Generalized Anxiety Disorder        Tresa Res, LCSW 09/27/2013

## 2013-09-28 ENCOUNTER — Encounter: Payer: Self-pay | Admitting: Internal Medicine

## 2013-10-02 ENCOUNTER — Ambulatory Visit (INDEPENDENT_AMBULATORY_CARE_PROVIDER_SITE_OTHER): Payer: 59 | Admitting: Psychiatry

## 2013-10-02 DIAGNOSIS — F411 Generalized anxiety disorder: Secondary | ICD-10-CM

## 2013-10-02 NOTE — Progress Notes (Signed)
   THERAPIST PROGRESS NOTE  Session Time: 8:00-8:50 am  Participation Level: Active  Behavioral Response: CasualAlertAnxious  Type of Therapy: Individual Therapy  Treatment Goals addressed: Anxiety and Job Stress  Interventions: CBT, DBT and Biofeedback  Summary: Anita Schmidt is a 58 y.o. female who presents with moderate anxiety. Pt reports deciding to take this week and next week off from work to continue working on her anxiety management. Pt said her Neuro feedback therapist with [facility] wrote Pt off from work. Pt said she completed her homework from last session and got out of the house and socialized more. She walked over four miles outside this past Sunday, by herself and with friends. Pt said it made her feel significantly better. Pt said she notices that when she gets outside or engaged in other activities her anxiety goes down. Pt said she is still doing the biofeedback twice daily and meditations in the evening along with her Yoga an Neuro feedback three times per week.  Pt reports her anxiety is lessoning, but she said it returns at the thought of returning to her job. Pt described the triggers at her job contributing to high anxiety and discussed options and barriers to both getting another job and retiring from work [employer]. Pt does not feel she can financially afford to retire until she is 58 years old and said she has about seven years left to continue working. Pt participated in using examples from this past week that were anxiety provoking situations to practice the DBT skill of distress tolerance and started to identify when she feels anxiety, the cause and determining if she can fix it ways to resolve it in the moment. Pt said she is feeling hopeful about her progress as she is learning more tools to better manage her symptoms.   Suicidal/Homicidal: No  Therapist Response: Assessed overall level of anxiety per Pt self report and behavior during session.  Reviewed homework and the success of using it to manage symptoms, encouraged continued use of biofeedback, meditation, Yoga and Neuro feedback, Educated on inward vs outwardly focused attention from the Mindfulness skills and encouraged more outside distraction away from focusing on herself, Introduced Pt to the Distress Tolerance Skill of if you can fix it fix it.   Plan: Return again in 1 week. Continue working on Sports administrator through Biofeedback, Mindfulness and Distress Tolerance. Review homework if you can fix it fix it and teach about management.   Diagnosis: Axis I: Generalized Anxiety Disorder      Tresa Res, LCSW 10/02/2013

## 2013-10-10 ENCOUNTER — Ambulatory Visit (HOSPITAL_COMMUNITY): Payer: Self-pay | Admitting: Psychiatry

## 2013-10-16 ENCOUNTER — Ambulatory Visit (INDEPENDENT_AMBULATORY_CARE_PROVIDER_SITE_OTHER): Payer: 59 | Admitting: Psychiatry

## 2013-10-16 DIAGNOSIS — F411 Generalized anxiety disorder: Secondary | ICD-10-CM

## 2013-10-16 NOTE — Progress Notes (Signed)
   THERAPIST PROGRESS NOTE  Session Time: 4:00-4:50 pm  Participation Level: Active  Behavioral Response: Neat and Well GroomedAlertAnxious  Type of Therapy: Individual Therapy  Treatment Goals addressed: Anxiety and Job Stress  Interventions: CBT and Supportive  Summary: Anita Schmidt is a 58 y.o. female who presents with improved anxiety and functioning, but still reports moderate anxiety symptoms and feelings of panic when in restricted situations. Pt said she feel some improvement in her anxiety, but processed feelings of frustration that she is not more improved. Pt said she has returned to work starting last week Monday on a part time basis and it gives her some level of comfort to know that she is able to leave if her symptoms escalate beyond being able to function at work.  Pt said she continues to get her nuerofeedback treatments and feels they are helping reducing anxiety symptoms. Pt said she is also trying to challenge negative anxiety thinking and replace it with more neutral thinking. Pt said she wants that carefree person back she used to be in her younger years, but said she is grateful and feels like she is making progress.   Suicidal/Homicidal: No  Therapist Response: Assessed overall level of functioning per Pt self report, reviewed progress in challenging negative self-talk and educated on the importance of compassion with regards to symptoms instead of self-blame. Practiced using the skill of re framing and explored hopefulness, encouraged continued use of biofeedback and coping skills for stress management.   Plan: Return again in 1 weeks.  Diagnosis: Axis I: Generalized Anxiety Disorder        Tresa Res, LCSW 10/16/2013

## 2013-10-23 ENCOUNTER — Ambulatory Visit (HOSPITAL_COMMUNITY): Payer: Self-pay | Admitting: Psychiatry

## 2013-10-24 ENCOUNTER — Ambulatory Visit (INDEPENDENT_AMBULATORY_CARE_PROVIDER_SITE_OTHER): Payer: 59 | Admitting: Psychiatry

## 2013-10-24 DIAGNOSIS — F411 Generalized anxiety disorder: Secondary | ICD-10-CM

## 2013-10-24 NOTE — Progress Notes (Signed)
   THERAPIST PROGRESS NOTE  Session Time: 8:00-8:50 am  Participation Level: Active  Behavioral Response: Casual, Neat and Well GroomedAlertAnxious and Depressed  Type of Therapy: Individual Therapy  Treatment Goals addressed: Anxiety and Job Stress  Interventions: CBT and Supportive  Summary: Anita Schmidt is a 58 y.o. female who presents with mild anxious mood and affect. Pt became tearful when talking about adventures and relationships from her past and compared her life then to her life now. Pt said she continues to work and is up to full time and continues to get her nuerofeedback treatments. Pt reports being able to use her breathing and skills of compassion to manage her anxiety symptoms at work, but still feels the life she is living is not contributing to her happiness. Pt agreed to explore and rate each life area for discussion next week.    Suicidal/Homicidal: No  Therapist Response: Assessed overall level of anxiety and depression symptoms per Pt self report, processed feelings of disappointment with current life situations and assigned wheel of life exercise to explore making some changes in Pts current situations, encouraged continued use of anxiety management skills.   Plan: Return again in 1 weeks. Follow up with wheel of life homework.  Diagnosis: Axis I: Generalized Anxiety Disorder       Tresa Res, LCSW 10/24/2013

## 2013-10-30 ENCOUNTER — Ambulatory Visit (INDEPENDENT_AMBULATORY_CARE_PROVIDER_SITE_OTHER): Payer: 59 | Admitting: Psychiatry

## 2013-10-30 DIAGNOSIS — F411 Generalized anxiety disorder: Secondary | ICD-10-CM

## 2013-10-31 ENCOUNTER — Encounter: Payer: Self-pay | Admitting: Obstetrics and Gynecology

## 2013-10-31 NOTE — Progress Notes (Signed)
   THERAPIST PROGRESS NOTE  Session Time: 4:00-4:50 pm  Participation Level: Active  Behavioral Response: Neat and Well GroomedAlertAnxious  Type of Therapy: Individual Therapy  Treatment Goals addressed: Anxiety, Job Stress  Interventions: DBT  Summary: Anita Schmidt is a 58 y.o. female who presents with mild anxious mood and affect and reports continued improvement with reducing anxiety throughout her week and at work. Pt said she is still working full time and using he breathing and coping skills to manage symptoms of anxiety at work. Pt said she is still undergoing nuerofeedback treatments and feels the combination of both therapy and these treatments are helping her. Pt said she was able to go out this past weekend and go shopping and run errands like she used to when she was healthy but felt she overdid it and her anxiety crept in throughout the day making it hard to sleep and concentrate later that night. Pt was receptive to learning ways to incorporate her anxiety skills throughout her day and practicing checking in with herself throughout the day to evaluate anxiety level. Pt said she is pleased with the progress she is making. Pt also completed the Wheel of life activity to better understand the areas of her life and her satisfaction in each area.   Suicidal/Homicidal: No Therapist Response: Assessed overall level of anxiety per Pt self report, completed the Wheel of life activity and analyzed each area for satisfaction, reviewed past week and identified areas where skills could be inserted to better manage anxiety symptoms, reviewed biofeedback use and educated on how to self-evaluate for anxiety throughout the day and encouraged use. Identified anxiety triggers.   Plan: Return again in 1 weeks. Continue using biofeedback, follow up with daily self check in and explore neuropsychology and give Pt article for depression treatment.   Diagnosis: Axis I: Generalized Anxiety  Disorder      Tresa Res, LCSW 10/31/2013

## 2013-11-07 ENCOUNTER — Ambulatory Visit (INDEPENDENT_AMBULATORY_CARE_PROVIDER_SITE_OTHER): Payer: 59 | Admitting: Psychiatry

## 2013-11-07 DIAGNOSIS — F411 Generalized anxiety disorder: Secondary | ICD-10-CM

## 2013-11-07 NOTE — Progress Notes (Signed)
   THERAPIST PROGRESS NOTE  Session Time: 8:00-8:50 am  Participation Level: Active  Behavioral Response: CasualAlertAnxious  Type of Therapy: Individual Therapy  Treatment Goals addressed: Anxiety and Job Stress  Interventions: CBT and DBT  Summary: Anita Schmidt is a 58 y.o. female who presents with mild anxious mood and affect. Pt reports her current anxiety level a 7/10 (with 10 being minimal anxiety). Pt said she had a difficult night with "bad dreams" and her irritable bowel condition keeping her awake, which Pt feels contributed to higher stress levels this morning. Pt said she continues to work full time and is managing her anxiety there effectively. Pt identified the noises and interactions with others as a primary stress trigger at work given that the other day when most of the staff were out of the office, Pt felt calm and the work day went by very fast. Pt identified ways she can alter her work environment to better manage and account for these triggers including exploring moving her cubical to the back area.  Pt said she would like to learn how to meditate more effectively and inquired about options. Pt said she continues to recieve the nuerofeedback and feels like she is still making progress decreasing anxiety symptoms.   Suicidal/Homicidal: No  Therapist Response: Assessed overall level of anxiety symptoms per Pt self report, educated on [place] for guided meditations, pod casts and instructions on how to mediate for Pt to practice this week, reviewed anxiety coping skills current being used that continue to be effective, discussed aromatherapy for stress management at work and for increased sleep, provided Pt a handout on neuropsychology and how to alter the brain from depressed thinking to more positive thinking, explored self-care items to utilize when anxiety is higher and basic skills are not working.   Plan: Return again in 1 weeks. Review Bjorn Pippin and educate on  effective meditation, review article on neuropsychology.   Diagnosis: Axis I: Generalized Anxiety Disorder        Tresa Res, LCSW 11/07/2013

## 2013-11-13 ENCOUNTER — Ambulatory Visit (INDEPENDENT_AMBULATORY_CARE_PROVIDER_SITE_OTHER): Payer: 59 | Admitting: Psychiatry

## 2013-11-13 DIAGNOSIS — F411 Generalized anxiety disorder: Secondary | ICD-10-CM

## 2013-11-14 NOTE — Progress Notes (Signed)
   THERAPIST PROGRESS NOTE  Session Time: 4:00-4:50 pm  Participation Level: Active  Behavioral Response: Neat and Well GroomedAlertEuthymic  Type of Therapy: Individual Therapy  Treatment Goals addressed: Anxiety  Interventions: CBT and Supportive  Summary: Anita Schmidt is a 59 y.o. female who presents with elevated mood and affect without any anxiety symptoms. Pt said she feels really good and "back to normal". Pt said she continues to receive the Neuro feedback treatments and has about five left until she completes their treatment protocol. Pt described being able to manage anxiety while working full time and maintaining her overall wellness. Pt said she still has moments of anxiety like when she is driving and gets caught in heave traffic jams, but feels she has tools to manage these situations. Pt reviewed the coping skills she is using and said she enjoyed learning about the neuroscience of depression through the article writer provided last session. Pt said she feels she can suspend counseling while writer is on maternity leave due to her current level of functioning. Pt denied another counseling referral.    Suicidal/Homicidal: No  Therapist Response: Assessed overall level of anxiety, reviewed progress in treatment and coping skills that are effective in treating anxiety, discussed work stress and skills being used there, planned for maternity leave.   Plan: Return again in 1 weeks.  Diagnosis: Axis I: Generalized Anxiety Disorder      Tresa Res, LCSW 11/14/2013

## 2013-11-21 ENCOUNTER — Ambulatory Visit (INDEPENDENT_AMBULATORY_CARE_PROVIDER_SITE_OTHER): Payer: 59 | Admitting: Psychiatry

## 2013-11-21 DIAGNOSIS — F411 Generalized anxiety disorder: Secondary | ICD-10-CM

## 2013-11-21 NOTE — Progress Notes (Signed)
   THERAPIST PROGRESS NOTE  Session Time: 8:00-8:50 am   Participation Level: Active  Behavioral Response: Neat and Well GroomedAlertEuthymic  Type of Therapy: Individual Therapy  Treatment Goals addressed: Anxiety  Interventions: CBT and Supportive  Summary: Anita Schmidt is a 58 y.o. female who presents with euthymic mood and affect. Pt said she continues to use her anxiety management tools and finds them effective. Pt processed situations that past week that caused anxiety and explored solutions to them. Pt identified her need to fix others and take on their emotions. Pt was able to explore better boundary setting with where she ends and others begin and let go of some of her need to get involved in others situations.   Suicidal/Homicidal: No   Therapist Response: Assessed overall level of functioning per Pt self report, reviewed progress in treatment and coping skills being used to [employer], educated on boundaries to reduce anxiety and practiced a communication technique to help Pt reflcet others feelings back to them instead of trying to problem solve for them and fix their problems.   Plan: Return again in 1 weeks.  Diagnosis: Axis I: Generalized Anxiety Disorder       Tresa Res, LCSW 11/21/2013

## 2013-11-26 ENCOUNTER — Ambulatory Visit: Payer: 59 | Admitting: Obstetrics and Gynecology

## 2013-11-27 ENCOUNTER — Ambulatory Visit (INDEPENDENT_AMBULATORY_CARE_PROVIDER_SITE_OTHER): Payer: 59 | Admitting: Psychiatry

## 2013-11-27 DIAGNOSIS — F411 Generalized anxiety disorder: Secondary | ICD-10-CM

## 2013-11-28 NOTE — Progress Notes (Signed)
   THERAPIST PROGRESS NOTE  Session Time: 4:00-4:50 pm   Participation Level: Active  Behavioral Response: Neat and Well GroomedAlertAnxious  Type of Therapy: Individual Therapy  Treatment Goals addressed: Anxiety and coming to terms with death of parents  Interventions: CBT, Strength-based and Supportive  Summary: JACKALYNN ART is a 58 y.o. female who presents with mild anxious mood and affect. Pt said she "almost had a panic attack today at work". Pt identified the triggers as her mother planning a visit to stay with Pt for two weeks this July. Pt processed feelings of concern about this visit since this was the precipitating event to her past panic disorder flare up this past December that took Pt out of work. Pt talked about fears associated with her mother aging and feeling responsible to ensure her wellness. Pt is concerned about Pt living so far away and living alone and the pressure this puts on Pt to find her stable and safe housing. Pt identified wellness tools she can use while her mother is here to manage the anxiety symptoms and wrote down the skills she uses most proficiently. Pt reviewed her progress since starting therapy to remind herself of the progress she has made in reducing and managing anxiety symptoms.   Suicidal/Homicidal: No   Therapist Response: Assessed overall level of anxiety and panic symptoms. Reviewed triggers to increased panic, educated on triggers, reviewed coping skills and encouraged continued use, recommended increasing use of heartmath daily during stressful events instead of waiting until later, encouraged use of Jama Flavors moment of calm relaxation more regularly throughout the week.   Plan: Return again in 1 weeks.  Diagnosis: Axis I: Generalized Anxiety Disorder      Tresa Res, LCSW 11/28/2013

## 2013-12-05 ENCOUNTER — Ambulatory Visit (INDEPENDENT_AMBULATORY_CARE_PROVIDER_SITE_OTHER): Payer: 59 | Admitting: Psychiatry

## 2013-12-05 ENCOUNTER — Telehealth: Payer: Self-pay | Admitting: *Deleted

## 2013-12-05 ENCOUNTER — Ambulatory Visit: Payer: 59 | Admitting: Obstetrics and Gynecology

## 2013-12-05 DIAGNOSIS — F411 Generalized anxiety disorder: Secondary | ICD-10-CM

## 2013-12-05 NOTE — Telephone Encounter (Signed)
Call to patient regarding appointment time tomorrow. Due to schedule conflict, offer patient earlier time tomorrow so that she would not be kept waiting. Patient cant come earlier due to work schedule.She is willing to come later. Advised to keep scheduled time of 330 and we will let her know if we are delayed and need her to come later.  Routing to provider for final review. Patient agreeable to disposition. Will close encounter

## 2013-12-05 NOTE — Progress Notes (Signed)
   THERAPIST PROGRESS NOTE  Session Time: 8:00-8:50 am   Participation Level: Active  Behavioral Response: Neat and Well GroomedAlertAnxious  Type of Therapy: Individual Therapy  Treatment Goals addressed: Anxiety and Job Stress  Interventions: CBT and DBT  Summary: Anita Schmidt is a 58 y.o. female who presents with mild anxiety. Pt continues to make great progress with using her skills of anxiety management and being able to describe how she is challenging negative thought processes, using Mindfulness to stay in the moment and using relaxation and biofeedback skills to minimize anxiety symptoms.  Pt said she had a more stressful week this past week where she feared her depression was returning but then linked it the feelings of sadness to her good friend and pastor leaving to move out of state. Pt said she is still stressed some about working all day, but is able to reframe her thoughts and manage her anxiety while at work. Pt is exploring the possibility of requesting a thirty hour work week and wants to focus on ways later to talk with her supervisor about making that request. Pt processed good days and bad days this past week to identify thoughts and behaviors that went into making them good or bad with regards to anxiety and Pt was able to identify triggers and coping skills. Pt said she is aware of the progress she is making, but admitted that it is a lot of work and requires a lot of awareness. Pt said she feels stable and ready for writer to go on maternity leave. Pt agrees to call [facility] if she needs additional support while writer is on leave.   Suicidal/Homicidal: No   Therapist Response: Assessed overall level of functioning per Pt self report, used cbt to challenge distorted thought patterns and engage the skill of reframing, used DBT skills of distress tolerance and if you can fix it fix it, encouraged self-compassion and awareness towards feelings instead of rejecting them, educated  on feelings, processed feelings regarding maternity leave.   Plan: Return again following writers maternity leave.  Diagnosis: Axis I:Generalized Anxiety Disorder      Tresa Res, LCSW 12/05/2013

## 2013-12-06 ENCOUNTER — Ambulatory Visit (INDEPENDENT_AMBULATORY_CARE_PROVIDER_SITE_OTHER): Payer: 59 | Admitting: Obstetrics and Gynecology

## 2013-12-06 ENCOUNTER — Encounter: Payer: Self-pay | Admitting: Obstetrics and Gynecology

## 2013-12-06 VITALS — BP 130/80 | HR 68 | Ht 66.0 in | Wt 134.6 lb

## 2013-12-06 DIAGNOSIS — Z0000 Encounter for general adult medical examination without abnormal findings: Secondary | ICD-10-CM

## 2013-12-06 DIAGNOSIS — Z01419 Encounter for gynecological examination (general) (routine) without abnormal findings: Secondary | ICD-10-CM

## 2013-12-06 LAB — POCT URINALYSIS DIPSTICK
BILIRUBIN UA: NEGATIVE
Blood, UA: NEGATIVE
GLUCOSE UA: NEGATIVE
KETONES UA: NEGATIVE
Leukocytes, UA: NEGATIVE
Nitrite, UA: NEGATIVE
Protein, UA: NEGATIVE
UROBILINOGEN UA: NEGATIVE
pH, UA: 6

## 2013-12-06 NOTE — Patient Instructions (Signed)

## 2013-12-06 NOTE — Progress Notes (Signed)
Patient ID: Anita Schmidt, female   DOB: 1956-03-27, 58 y.o.   MRN: [id number] GYNECOLOGY VISIT  PCP:   Bethena Roys  Referring provider:   HPI: 58 y.o.   Single  Caucasian  female   G2P0020 with Patient's last menstrual period was 07/19/2008.   here for   AEX.  Treated for anxiety.  Doing nonmedical therapy - neurofeedback therapy at [facility].  Had a spike in anxiety after mother came to visit.   Hgb:    PCP Urine:  Neg  GYNECOLOGIC HISTORY: Patient's last menstrual period was 07/19/2008. Sexually active:  no Partner preference: female Contraception:  postmenopausal  Menopausal hormone therapy: no DES exposure: no   Blood transfusions:   no Sexually transmitted diseases:  no  GYN procedures and prior surgeries: D & C1995--Dr. Fore  Last mammogram:  12-05-12 wnl except for calcifications at 12 o'clock area in Right breast:[facility].               Last pap and high risk HPV testing:  11-23-12  Wnl:neg HR HPV History of abnormal pap smear:  no   OB History   Grav Para Term Preterm Abortions TAB SAB Ect Mult Living   2    2     0       LIFESTYLE: Exercise:    Walking and Yoga           Tobacco:    no Alcohol:     1-2 drinks a month Drug use:  no  OTHER HEALTH MAINTENANCE: Tetanus/TDap:   Up to date with PCP Gardisil:               n/a Influenza:             never Zostavax:             n/a  Bone density:      2012 with Dr. Clayborn Heron office:[facility]l Colonoscopy:      2010 with Dr. Meriel Flavors.  Next colonoscopy due 2020.  Cholesterol check: wnl through work  Family History  Problem Relation Age of Onset  . Hypertension Mother     PMH  . Alcohol abuse Mother   . Alzheimer's disease Maternal Grandmother   . Hypertension Maternal Grandmother   . Anxiety disorder Brother   . Stroke Paternal Uncle   . Alzheimer's disease Paternal Uncle   . Diabetes Neg Hx   . Depression Brother     Patient Active Problem List   Diagnosis Date Noted  .  Generalized anxiety disorder 05/24/2013  . Elevated blood pressure reading without diagnosis of hypertension 05/23/2013  . Diverticulosis of colon without hemorrhage 04/27/2013  . Basal cell cancer 04/27/2013  . Hemochromatosis   . Bee sting allergy 03/26/2011  . IBS 02/10/2010  . ANXIETY DISORDER 12/25/2008  . [place] DISEASE, CERVICAL 11/19/2008  . RHINITIS 09/19/2008  . HYPERLIPIDEMIA 08/01/2008  . 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 MENOPAUSAL SYNDROME 08/01/2008  . FIBROCYSTIC BREAST DISEASE, HX OF 08/01/2008   Past Medical History  Diagnosis Date  . Fibrocystic breast disease   . Gilbert's syndrome   . Hyperlipidemia   . Anxiety disorder   . Hemochromatosis   . Skin cancer, basal cell     nose  . Anxiety     severe    Past Surgical History  Procedure Laterality Date  . Dilation and curettage of uterus    . Tonsillectomy    . Inguinal hernia repair    . Tracer pellets  2003    in breast; [place]  . Breast biopsy    . Colonscopy  2011    Tics ; Dr Sharlett Iles  . Mohs surgery  2012    nose    ALLERGIES: Latex; Epinephrine; Effexor; Amoxicillin; and Citalopram  No current outpatient prescriptions on file.   No current facility-administered medications for this visit.     ROS:  Pertinent items are noted in HPI.  SOCIAL HISTORY:  Single.   PHYSICAL EXAMINATION:    BP 130/80  Pulse 68  Ht 5' 6"  (1.676 m)  Wt 134 lb 9.6 oz (61.054 kg)  BMI 21.74 kg/m2  LMP 07/19/2008   Wt Readings from Last 3 Encounters:  12/06/13 134 lb 9.6 oz (61.054 kg)  08/23/13 134 lb 4 oz (60.895 kg)  08/14/13 135 lb (61.236 kg)     Ht Readings from Last 3 Encounters:  12/06/13 5' 6"  (1.676 m)  04/27/13 5' 6.75" (1.695 m)  11/22/12 5' 6"  (1.676 m)    General appearance: alert, cooperative and appears stated age Head: Normocephalic, without obvious abnormality, atraumatic Neck: no adenopathy, supple, symmetrical, trachea midline and thyroid not enlarged, symmetric, no  tenderness/mass/nodules Lungs: clear to auscultation bilaterally Breasts: Inspection negative, No nipple retraction or dimpling, No nipple discharge or bleeding, No axillary or supraclavicular adenopathy, Normal to palpation without dominant masses Heart: regular rate and rhythm Abdomen: soft, non-tender; no masses,  no organomegaly Extremities: extremities normal, atraumatic, no cyanosis or edema Skin: Skin color, texture, turgor normal. No rashes or lesions Lymph nodes: Cervical, supraclavicular, and axillary nodes normal. No abnormal inguinal nodes palpated Neurologic: Grossly normal  Pelvic: External genitalia:  no lesions              Urethra:  normal appearing urethra with no masses, tenderness or lesions              Bartholins and Skenes: normal                 Vagina: normal appearing vagina with normal color and discharge, no lesions, atrophic changes noted - petechiae with speculum placement.               Cervix: normal appearance              Pap and high risk HPV testing done: no.            Bimanual Exam:  Uterus:  uterus is normal size, shape, consistency and nontender                                      Adnexa: normal adnexa in size, nontender and no masses                                      Rectovaginal: Confirms                                      Anus:  normal sphincter tone, no lesions  ASSESSMENT  Normal gynecologic exam. Anxiety.  Nonmedical therapy.   PLAN  Mammogram recommended yearly.  Pap smear and high risk HPV testing not indicated.  Counseled on self breast exam, Calcium and vitamin D intake, exercise. Return annually or prn   An After Visit Summary  was printed and given to the patient.

## 2013-12-11 ENCOUNTER — Ambulatory Visit (HOSPITAL_COMMUNITY): Payer: Self-pay | Admitting: Psychiatry

## 2013-12-28 ENCOUNTER — Other Ambulatory Visit: Payer: Self-pay

## 2013-12-28 DIAGNOSIS — Z1231 Encounter for screening mammogram for malignant neoplasm of breast: Secondary | ICD-10-CM

## 2014-01-07 ENCOUNTER — Ambulatory Visit
Admission: RE | Admit: 2014-01-07 | Discharge: 2014-01-07 | Disposition: A | Discharge status: Home / Self Care | Payer: 59 | Source: Ambulatory Visit

## 2014-01-07 DIAGNOSIS — Z1231 Encounter for screening mammogram for malignant neoplasm of breast: Secondary | ICD-10-CM

## 2014-01-10 ENCOUNTER — Ambulatory Visit (INDEPENDENT_AMBULATORY_CARE_PROVIDER_SITE_OTHER): Payer: 59 | Admitting: Certified Nurse Midwife

## 2014-01-10 ENCOUNTER — Encounter: Payer: Self-pay | Admitting: Certified Nurse Midwife

## 2014-01-10 VITALS — BP 124/84 | HR 74 | Resp 16 | Ht 66.0 in | Wt 133.0 lb

## 2014-01-10 DIAGNOSIS — B373 Candidiasis of vulva and vagina: Secondary | ICD-10-CM

## 2014-01-10 DIAGNOSIS — B3731 Acute candidiasis of vulva and vagina: Secondary | ICD-10-CM

## 2014-01-10 DIAGNOSIS — N390 Urinary tract infection, site not specified: Secondary | ICD-10-CM

## 2014-01-10 LAB — POCT URINALYSIS DIPSTICK
Bilirubin, UA: NEGATIVE
Blood, UA: NEGATIVE
Glucose, UA: NEGATIVE
Ketones, UA: NEGATIVE
Nitrite, UA: NEGATIVE
PROTEIN UA: NEGATIVE
UROBILINOGEN UA: NEGATIVE
pH, UA: 5

## 2014-01-10 MED ORDER — TERCONAZOLE 0.4 % VA CREA: 1.0000 | TOPICAL_CREAM | Freq: Every day | VAGINAL | Status: DC

## 2014-01-10 NOTE — Progress Notes (Signed)
58 y.o. single white female g2p0020 here with complaint of vaginal symptoms of itching, burning, and increase discharge. Describes discharge as white, no odor, but irritating.Onset of symptoms 1 days ago. Denies new personal products or vaginal dryness. Patient has also noticed a tender area at clitoral hood. Patient has been picking blueberries every day for long periods with wet clothing on and noticed change in discharge and sore area.  No STD concerns not sexually active.. Urinary symptoms some frequency, no urgency or pain, but concerned because she had a asymptomatic UTI in past. Denies fever, chills or back pain. Marland Kitchen   O:Healthy female WDWN Affect: normal, orientation x 3  Exam:Skin: warm and dry CVAT: negative bilateral Abdomen: non tender, negative suprapubic Lymph node: no enlargement or tenderness Pelvic exam: External genital: slight increase pink with tenderness. Small slightly pink area on external skin above clitoral hood, no lesion or blister, non tender, no exudate BUS: negative Bladder, urethral meatus:non tender Vagina: white thick discharge noted. Ph:4.0   ,Wet prep taken Cervix: normal, non tender Uterus: normal, non tender Adnexa:normal, non tender, no masses or fullness noted   Wet Prep results: positive for yeast   A:Yeast vaginitis/vulvitis R/O UTI   P:Discussed findings of yeast vaginitis/vulvitis and etiology. Discussed Aveeno or baking soda sitz bath for comfort. Avoid moist clothes or pads for extended period of time. If working out in gym clothes or swim suits for long periods of time change underwear or bottoms of swimsuit if possible. Olive Oil use for skin protection prior to activity can be used to external skin. Discussed Aveeno anti itch cream for external use., prn Rx: Terazol 7 see order CVE:LFYBO micro/culture Patient aware of UTI symptoms and will advise if change.  Rv prn

## 2014-01-10 NOTE — Patient Instructions (Signed)

## 2014-01-11 LAB — URINALYSIS, MICROSCOPIC ONLY
BACTERIA UA: NONE SEEN
CASTS: NONE SEEN
Crystals: NONE SEEN
Squamous Epithelial / LPF: NONE SEEN

## 2014-01-11 LAB — URINE CULTURE
Colony Count: NO GROWTH
Organism ID, Bacteria: NO GROWTH

## 2014-01-14 NOTE — Progress Notes (Signed)
Reviewed personally.  M. Suzanne Miller, MD.  

## 2014-05-14 ENCOUNTER — Telehealth: Payer: Self-pay | Admitting: Obstetrics and Gynecology

## 2014-05-14 NOTE — Telephone Encounter (Signed)
Dr cx appt lmtcb to rs

## 2014-05-15 ENCOUNTER — Telehealth: Payer: Self-pay | Admitting: Obstetrics and Gynecology

## 2014-05-20 ENCOUNTER — Encounter: Payer: Self-pay | Admitting: Certified Nurse Midwife

## 2014-05-28 NOTE — Telephone Encounter (Signed)
error 

## 2014-06-20 ENCOUNTER — Ambulatory Visit (INDEPENDENT_AMBULATORY_CARE_PROVIDER_SITE_OTHER): Payer: 59

## 2014-06-20 VITALS — BP 116/90

## 2014-06-20 DIAGNOSIS — R030 Elevated blood-pressure reading, without diagnosis of hypertension: Secondary | ICD-10-CM

## 2014-06-21 ENCOUNTER — Telehealth: Payer: Self-pay | Admitting: *Deleted

## 2014-06-21 NOTE — Telephone Encounter (Signed)
-----   Message from Hendricks Limes, MD sent at 06/20/2014  6:35 PM EST ----- Minimal Blood Pressure Goal= AVERAGE < 140/90;  Ideal is an AVERAGE < 135/85. This AVERAGE should be calculated from @ least 5-7 BP readings taken @ different times of day on different days of week. You should not respond to isolated BP readings , but rather the AVERAGE for that week .Please bring your  blood pressure cuff to office visits to verify that it is reliable.It  can also be checked against the blood pressure device at the pharmacy. Finger or wrist cuffs are not dependable; an arm cuff is.

## 2014-06-21 NOTE — Telephone Encounter (Signed)
Notified pt with md response.../lmb 

## 2014-07-02 ENCOUNTER — Other Ambulatory Visit: Payer: Self-pay | Admitting: Physician Assistant

## 2014-09-19 ENCOUNTER — Encounter: Payer: Self-pay | Admitting: Internal Medicine

## 2014-11-06 ENCOUNTER — Encounter: Payer: Self-pay | Admitting: Internal Medicine

## 2014-12-09 ENCOUNTER — Other Ambulatory Visit (INDEPENDENT_AMBULATORY_CARE_PROVIDER_SITE_OTHER): Payer: 59

## 2014-12-09 ENCOUNTER — Encounter: Payer: Self-pay | Admitting: Internal Medicine

## 2014-12-09 ENCOUNTER — Ambulatory Visit (INDEPENDENT_AMBULATORY_CARE_PROVIDER_SITE_OTHER): Payer: 59 | Admitting: Internal Medicine

## 2014-12-09 ENCOUNTER — Other Ambulatory Visit: Payer: Self-pay | Admitting: Internal Medicine

## 2014-12-09 VITALS — BP 144/78 | HR 62 | Temp 97.7°F | Wt 130.2 lb

## 2014-12-09 DIAGNOSIS — R030 Elevated blood-pressure reading, without diagnosis of hypertension: Secondary | ICD-10-CM | POA: Diagnosis not present

## 2014-12-09 DIAGNOSIS — Z0000 Encounter for general adult medical examination without abnormal findings: Secondary | ICD-10-CM

## 2014-12-09 LAB — BASIC METABOLIC PANEL
BUN: 15 mg/dL (ref 6–23)
CALCIUM: 9.6 mg/dL (ref 8.4–10.5)
CO2: 27 meq/L (ref 19–32)
CREATININE: 0.79 mg/dL (ref 0.40–1.20)
Chloride: 104 mEq/L (ref 96–112)
GFR: 78.74 mL/min (ref >60.00)
GLUCOSE: 116 mg/dL — AB (ref 70–99)
POTASSIUM: 4.1 meq/L (ref 3.5–5.1)
SODIUM: 139 meq/L (ref 135–145)

## 2014-12-09 LAB — CBC WITH DIFFERENTIAL/PLATELET
BASOS ABS: 0 10*3/uL (ref 0.0–0.1)
BASOS PCT: 0.4 % (ref 0.0–3.0)
Eosinophils Absolute: 0 10*3/uL (ref 0.0–0.7)
Eosinophils Relative: 0.7 % (ref 0.0–5.0)
HEMATOCRIT: 44 % (ref 36.0–46.0)
Hemoglobin: 15.2 g/dL — ABNORMAL HIGH (ref 12.0–15.0)
Lymphocytes Relative: 38 % (ref 12.0–46.0)
Lymphs Abs: 2 10*3/uL (ref 0.7–4.0)
MCHC: 34.5 g/dL (ref 30.0–36.0)
MCV: 91.9 fl (ref 78.0–100.0)
Monocytes Absolute: 0.5 10*3/uL (ref 0.1–1.0)
Monocytes Relative: 8.7 % (ref 3.0–12.0)
NEUTROS ABS: 2.7 10*3/uL (ref 1.4–7.7)
NEUTROS PCT: 52.2 % (ref 43.0–77.0)
PLATELETS: 267 10*3/uL (ref 150.0–400.0)
RBC: 4.79 Mil/uL (ref 3.87–5.11)
RDW: 12.7 % (ref 11.5–15.5)
WBC: 5.2 10*3/uL (ref 4.0–10.5)

## 2014-12-09 LAB — HEPATIC FUNCTION PANEL
ALBUMIN: 4.6 g/dL (ref 3.5–5.2)
ALT: 14 U/L (ref 0–35)
AST: 25 U/L (ref 0–37)
Alkaline Phosphatase: 59 U/L (ref 39–117)
Bilirubin, Direct: 0.1 mg/dL (ref 0.0–0.3)
TOTAL PROTEIN: 7.1 g/dL (ref 6.0–8.3)
Total Bilirubin: 0.9 mg/dL (ref 0.2–1.2)

## 2014-12-09 LAB — TSH: TSH: 1.86 u[IU]/mL (ref 0.35–4.50)

## 2014-12-09 NOTE — Patient Instructions (Signed)
  Your next office appointment will be determined based upon review of your pending labs  and  xrays  Those instructions will be transmitted to you by My Chart   Critical results will be called.   Followup as needed for any active or acute issue. Please report any significant change in your symptoms.  Minimal Blood Pressure Goal= AVERAGE < 140/90;  Ideal is an AVERAGE < 135/85. This AVERAGE should be calculated from @ least 5-7 BP readings taken @ different times of day on different days of week. You should not respond to isolated BP readings , but rather the AVERAGE for that week .Please bring your  blood pressure cuff to office visits to verify that it is reliable.It  can also be checked against the blood pressure device at the pharmacy. Finger or wrist cuffs are not dependable; an arm cuff is.

## 2014-12-09 NOTE — Progress Notes (Signed)
Pre visit review using our clinic review tool, if applicable. No additional management support is needed unless otherwise documented below in the visit note. 

## 2014-12-09 NOTE — Progress Notes (Signed)
Subjective:    Patient ID: Anita Schmidt, female    DOB: Jan 01, 1956, 59 y.o.   MRN: [id number]  HPI She is here for a physical;acute issues denied.   She is on a heart healthy diet. She walks 2.5 miles per day and does yoga 3 times a week without cardiovascular symptoms. Blood pressure is checked frequently and ranges from 100/72-150/94.  She's never smoked. She drinks wine occasionally.  There is no family history of myocardial infarction, stroke, or cancer. There is a strong family history of hypertension. Her mother now has Parkinson's.  Her colonoscopy was performed in 2011 was negative except for diverticulosis. She has no active GI symptoms.   Review of Systems   Chest pain, palpitations, tachycardia, exertional dyspnea, paroxysmal nocturnal dyspnea, claudication or edema are absent.  Unexplained weight loss, abdominal pain, significant dyspepsia, dysphagia, melena, rectal bleeding, or persistently small caliber stools are denied.   She is seeing a Social worker and denies any significant anxiety depression at this time.     Objective:   Physical Exam  Gen.: Adequately nourished in appearance. Alert, appropriate and cooperative throughout exam.  Appears younger than stated age  Head: Normocephalic without obvious abnormalities  Eyes: No corneal or conjunctival inflammation noted. Pupils equal round reactive to light and accommodation. Extraocular motion intact. Unsustained vertical nystagmus with superior gaze. Ears: External  ear exam reveals no significant lesions or deformities. Canals clear .TMs normal. Hearing is grossly normal bilaterally. Nose: External nasal exam reveals no deformity or inflammation. Nasal mucosa are pink and moist. No lesions or exudates noted.   Mouth: Oral mucosa and oropharynx reveal no lesions or exudates. Teeth in good repair. Neck: No deformities, masses, or tenderness noted. Range of motion & Thyroid normal. Lungs: Normal respiratory effort;  chest expands symmetrically. Lungs are clear to auscultation without rales, wheezes, or increased work of breathing. Heart: Slow rate and regular rhythm. Normal S1 and S2. No gallop, click, or rub. No murmur. Abdomen: Bowel sounds normal; abdomen soft and nontender. No masses, organomegaly or hernias noted. Genitalia:  as per Gyn                                  Musculoskeletal/extremities: No deformity or scoliosis noted of  the thoracic or lumbar spine.  No clubbing, cyanosis, edema, or significant extremity  deformity noted.  Range of motion normal . Tone & strength normal. Hand joints normal Fingernail  health good. Minor crepitus of knees  Able to lie down & sit up w/o help.  Negative SLR bilaterally Vascular: Carotid, radial artery, dorsalis pedis and  posterior tibial pulses are full and equal. No bruits present. Neurologic: Alert and oriented x3. Deep tendon reflexes symmetrical and normal.  Gait normal       Skin: Intact without suspicious lesions or rashes. Lymph: No cervical, axillary lymphadenopathy present. Psych: Mood and affect are normal. Normally interactive                                                                                      Assessment & Plan:  #1 comprehensive physical  exam; no acute findings  Plan: see Orders  & Recommendations

## 2014-12-10 ENCOUNTER — Encounter: Payer: Self-pay | Admitting: Internal Medicine

## 2014-12-10 ENCOUNTER — Other Ambulatory Visit (INDEPENDENT_AMBULATORY_CARE_PROVIDER_SITE_OTHER): Payer: 59

## 2014-12-10 DIAGNOSIS — R739 Hyperglycemia, unspecified: Secondary | ICD-10-CM | POA: Diagnosis not present

## 2014-12-10 LAB — HEMOGLOBIN A1C: HEMOGLOBIN A1C: 5.6 % (ref 4.6–6.5)

## 2014-12-11 ENCOUNTER — Ambulatory Visit: Payer: 59 | Admitting: Obstetrics and Gynecology

## 2014-12-11 LAB — NMR LIPOPROFILE WITH LIPIDS
Cholesterol, Total: 190 mg/dL (ref 100–199)
HDL Particle Number: 36.1 umol/L (ref >=30.5)
HDL Size: 9.5 nm (ref >=9.2)
HDL-C: 71 mg/dL (ref >39)
LDL CALC: 102 mg/dL — AB (ref 0–99)
LDL Particle Number: 1049 nmol/L — ABNORMAL HIGH (ref <1000)
LDL Size: 21.6 nm (ref >=20.8)
Large HDL-P: 10.7 umol/L (ref >=4.8)
Large VLDL-P: 1.9 nmol/L (ref <=2.7)
Small LDL Particle Number: 120 nmol/L (ref <=527)
TRIGLYCERIDES: 86 mg/dL (ref 0–149)
VLDL SIZE: 45.5 nm (ref <=46.6)

## 2014-12-12 ENCOUNTER — Ambulatory Visit (INDEPENDENT_AMBULATORY_CARE_PROVIDER_SITE_OTHER): Payer: 59 | Admitting: Obstetrics and Gynecology

## 2014-12-12 ENCOUNTER — Encounter: Payer: Self-pay | Admitting: Obstetrics and Gynecology

## 2014-12-12 VITALS — BP 120/82 | HR 70 | Resp 12 | Ht 66.5 in | Wt 130.2 lb

## 2014-12-12 DIAGNOSIS — N390 Urinary tract infection, site not specified: Secondary | ICD-10-CM

## 2014-12-12 DIAGNOSIS — R82998 Other abnormal findings in urine: Secondary | ICD-10-CM

## 2014-12-12 DIAGNOSIS — Z0000 Encounter for general adult medical examination without abnormal findings: Secondary | ICD-10-CM

## 2014-12-12 DIAGNOSIS — Z01419 Encounter for gynecological examination (general) (routine) without abnormal findings: Secondary | ICD-10-CM

## 2014-12-12 DIAGNOSIS — R1900 Intra-abdominal and pelvic swelling, mass and lump, unspecified site: Secondary | ICD-10-CM | POA: Diagnosis not present

## 2014-12-12 LAB — POCT URINALYSIS DIPSTICK
Bilirubin, UA: NEGATIVE
Glucose, UA: NEGATIVE
Ketones, UA: NEGATIVE
Nitrite, UA: NEGATIVE
PROTEIN UA: NEGATIVE
RBC UA: NEGATIVE
Urobilinogen, UA: NEGATIVE
pH, UA: 5

## 2014-12-12 NOTE — Patient Instructions (Signed)

## 2014-12-12 NOTE — Progress Notes (Signed)
Patient ID: Anita Schmidt, female   DOB: [birth year], 59 y.o.   MRN: [id number] 59 y.o. G8P0020 Single Caucasian female here for annual exam.    Doing yoga for anxiety.  Treating with therapist from [place].  Not using medication.   Some vaginal discharge last week, resolved now.  History of fibroids.   PCP:  Unice Cobble, MD   Patient's last menstrual period was 07/19/2008.          Sexually active: No. female partner The current method of family planning is post menopausal status.    Exercising: Yes.    walks 2.5 miles/day and yoga. Smoker:  no  Health Maintenance: Pap:  11-23-12 wnl:neg HR HPV History of abnormal Pap:  no MMG:  01-07-14 density/[facility] Colonoscopy:  2010 wnl with Dr. Sharlett Iles. Next due 2020. BMD:   2012  Result  Normal with Dr. Linna Darner TDaP:  Up to date with PCP Screening Labs:  Hb today: PCP, Urine today: 1+WBCs - asymptomatic.    reports that she has never smoked. She has never used smokeless tobacco. She reports that she drinks about 3.0 oz of alcohol per week. She reports that she does not use illicit drugs.  Past Medical History  Diagnosis Date  . Fibrocystic breast disease   . Gilbert's syndrome   . Hyperlipidemia   . Anxiety disorder   . Hemochromatosis   . Skin cancer, basal cell     nose  . Anxiety     severe    Past Surgical History  Procedure Laterality Date  . Dilation and curettage of uterus    . Tonsillectomy    . Inguinal hernia repair    . Tracer pellets  2003    in breast; [place]  . Breast biopsy    . Colonscopy  2011    Tics ; Dr Sharlett Iles  . Mohs surgery  2012    nose    No current outpatient prescriptions on file.   No current facility-administered medications for this visit.    Family History  Problem Relation Age of Onset  . Hypertension Mother     PMH  . Alcohol abuse Mother   . Alzheimer's disease Maternal Grandmother   . Hypertension Maternal Grandmother   . Anxiety disorder Brother   . Stroke  Paternal Uncle   . Alzheimer's disease Paternal Uncle   . Diabetes Neg Hx   . Depression Brother   . Parkinson's disease Mother     ROS:  Pertinent items are noted in HPI.  Otherwise, a comprehensive ROS was negative.  Exam:   BP 120/82 mmHg  Pulse 70  Resp 12  Ht 5' 6.5" (1.689 m)  Wt 130 lb 3.2 oz (59.058 kg)  BMI 20.70 kg/m2  LMP 07/19/2008    General appearance: alert, cooperative and appears stated age Head: Normocephalic, without obvious abnormality, atraumatic Neck: no adenopathy, supple, symmetrical, trachea midline and thyroid normal to inspection and palpation Lungs: clear to auscultation bilaterally Breasts: normal appearance, no masses or tenderness, Inspection negative, No nipple retraction or dimpling, No nipple discharge or bleeding, No axillary or supraclavicular adenopathy Heart: regular rate and rhythm Abdomen: soft, non-tender; bowel sounds normal; no masses,  no organomegaly Extremities: extremities normal, atraumatic, no cyanosis or edema Skin: Skin color, texture, turgor normal. No rashes or lesions Lymph nodes: Cervical, supraclavicular, and axillary nodes normal. No abnormal inguinal nodes palpated Neurologic: Grossly normal  Pelvic: External genitalia:  no lesions  Urethra:  normal appearing urethra with no masses, tenderness or lesions              Bartholins and Skenes: normal                 Vagina: normal appearing vagina with normal color and discharge, no lesions              Cervix: no lesions              Pap taken: No. Bimanual Exam:  Uterus:  normal size, contour, position, consistency, mobility, non-tender              Adnexa:  4 cm nontender right adnexal mass versus loop of bowel.  Left adnexa normal and without mass or tenderness.              Rectovaginal: Yes.  .  Confirms.              Anus:  normal sphincter tone, no lesions  Chaperone was present for exam.  Assessment:   Well woman visit with normal exam. Possible  right adnexal mass.  History of uterine fibroids.  Abnormal urine.   Plan: Yearly mammogram recommended after age 55.  Recommended self breast exam.  Pap and HR HPV as above. Discussed Calcium, Vitamin D, regular exercise program including cardiovascular and weight bearing exercise. Labs performed.  Yes.  .   See orders.  Urine micro and culture.  Refills given on medications.  No..    Discussion of pelvic exam findings.  Return for pelvic ultrasound.  Follow up annually and prn.     After visit summary provided.

## 2014-12-13 ENCOUNTER — Telehealth: Payer: Self-pay | Admitting: Obstetrics and Gynecology

## 2014-12-13 LAB — URINALYSIS, MICROSCOPIC ONLY
Casts: NONE SEEN
Crystals: NONE SEEN

## 2014-12-13 LAB — URINE CULTURE
COLONY COUNT: NO GROWTH
ORGANISM ID, BACTERIA: NO GROWTH

## 2014-12-13 NOTE — Telephone Encounter (Signed)
Call to patient. Advised of benefit quote received.  Patient agreeable. Scheduled PUS. Advised patient of 72 hour cancellation policy and $727 cancellation fee. Patient agreeable.

## 2014-12-19 ENCOUNTER — Encounter: Payer: Self-pay | Admitting: Obstetrics and Gynecology

## 2014-12-19 ENCOUNTER — Ambulatory Visit (INDEPENDENT_AMBULATORY_CARE_PROVIDER_SITE_OTHER): Payer: 59

## 2014-12-19 ENCOUNTER — Ambulatory Visit (INDEPENDENT_AMBULATORY_CARE_PROVIDER_SITE_OTHER): Payer: 59 | Admitting: Obstetrics and Gynecology

## 2014-12-19 VITALS — BP 140/88 | HR 70 | Ht 66.5 in | Wt 130.0 lb

## 2014-12-19 DIAGNOSIS — R1900 Intra-abdominal and pelvic swelling, mass and lump, unspecified site: Secondary | ICD-10-CM | POA: Diagnosis not present

## 2014-12-19 NOTE — Progress Notes (Signed)
Subjective  59 y.o. G9P0020  Caucasian female here for pelvic ultrasound for possible right adnexal mass on routine pelvic ultrasound.   Saw Dr. Linna Darner last week for her usual check up.   Objective  Pelvic ultrasound images and report reviewed with patient.  Uterus - No masses.  9 x 7 mm Nabothian cyst. EMS - 1.16 mm. Ovaries - normal.  No adnexal masses. Free fluid - no     Assessment  No evidence of pelvic mass.  Loop of bowel palpable on exam.   Plan  Ultrasound findings reviewed.  Reassurance given.  Follow up prn.   __15_____ minutes face to face time of which over 50% was spent in counseling.   After visit summary to patient.

## 2015-02-26 ENCOUNTER — Other Ambulatory Visit: Payer: Self-pay

## 2015-02-26 DIAGNOSIS — Z1231 Encounter for screening mammogram for malignant neoplasm of breast: Secondary | ICD-10-CM

## 2015-03-05 ENCOUNTER — Ambulatory Visit
Admission: RE | Admit: 2015-03-05 | Discharge: 2015-03-05 | Disposition: A | Discharge status: Home / Self Care | Payer: 59 | Source: Ambulatory Visit

## 2015-03-05 DIAGNOSIS — Z1231 Encounter for screening mammogram for malignant neoplasm of breast: Secondary | ICD-10-CM

## 2015-05-09 ENCOUNTER — Encounter: Payer: Self-pay | Admitting: Obstetrics and Gynecology

## 2015-08-11 ENCOUNTER — Ambulatory Visit (INDEPENDENT_AMBULATORY_CARE_PROVIDER_SITE_OTHER): Payer: 59 | Admitting: Obstetrics and Gynecology

## 2015-08-11 ENCOUNTER — Encounter: Payer: Self-pay | Admitting: Obstetrics and Gynecology

## 2015-08-11 VITALS — BP 150/88 | HR 70 | Ht 66.5 in | Wt 133.0 lb

## 2015-08-11 DIAGNOSIS — N760 Acute vaginitis: Secondary | ICD-10-CM

## 2015-08-11 DIAGNOSIS — R319 Hematuria, unspecified: Secondary | ICD-10-CM

## 2015-08-11 DIAGNOSIS — R109 Unspecified abdominal pain: Secondary | ICD-10-CM | POA: Diagnosis not present

## 2015-08-11 LAB — POCT URINALYSIS DIPSTICK
BILIRUBIN UA: NEGATIVE
Glucose, UA: NEGATIVE
KETONES UA: NEGATIVE
Nitrite, UA: NEGATIVE
Protein, UA: NEGATIVE
Urobilinogen, UA: NEGATIVE
pH, UA: 7

## 2015-08-11 NOTE — Progress Notes (Signed)
Patient ID: Anita Schmidt, female   DOB: [birth year], 60 y.o.   MRN: [id number] GYNECOLOGY  VISIT   HPI: 60 y.o.   Single  Caucasian  female   G2P0020 with Patient's last menstrual period was 07/19/2008.   here for left flank pain and left lower quadrant discomfort/bloating feeling which began last night.  Feels like she pulled her back yesterday.  Lifting wood.  Leaned over a chair.  This morning has some pelvic discomfort.  No nausea, vomiting, or fever.   Denies dysuria.  No blood in urine.  No urgency to void unless in the shower or drinks coffee or feels nervous.  Last UTI was several years ago.   Not sexually active for 10 years.   Notes some minor crustiness in her underwear.  Can leak a little of urine wit a sneeze sometimes.   Does yoga.   Urine Dip: Trace WBCs, Trace RBCs - tried to clean off first.   GYNECOLOGIC HISTORY: Patient's last menstrual period was 07/19/2008. Contraception:Postmenopausal Menopausal hormone therapy: none Last mammogram: 03-06-15 density cat.B/Neg/2 biopsy markers in Left breast stable/BiRads[facility] Last pap smear: 11-23-12 Neg:Neg HR HPV        OB History    Gravida Para Term Preterm AB TAB SAB Ectopic Multiple Living   2    2     0         Patient Active Problem List   Diagnosis Date Noted  . Generalized anxiety disorder 05/24/2013  . Elevated blood pressure reading without diagnosis of hypertension 05/23/2013  . Diverticulosis of colon without hemorrhage 04/27/2013  . Basal cell cancer 04/27/2013  . Hemochromatosis   . Bee sting allergy 03/26/2011  . IBS 02/10/2010  . ANXIETY DISORDER 12/25/2008  . [place] DISEASE, CERVICAL 11/19/2008  . RHINITIS 09/19/2008  . HYPERLIPIDEMIA 08/01/2008  . 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 MENOPAUSAL SYNDROME 08/01/2008  . FIBROCYSTIC BREAST DISEASE, HX OF 08/01/2008    Past Medical History  Diagnosis Date  . Fibrocystic breast disease   . Gilbert's syndrome   . Hyperlipidemia    . Anxiety disorder   . Hemochromatosis   . Skin cancer, basal cell     nose  . Anxiety     severe    Past Surgical History  Procedure Laterality Date  . Dilation and curettage of uterus    . Tonsillectomy    . Inguinal hernia repair    . Tracer pellets  2003    in breast; [place]  . Breast biopsy    . Colonscopy  2011    Tics ; Dr Sharlett Iles  . Mohs surgery  2012    nose    No current outpatient prescriptions on file.   No current facility-administered medications for this visit.     ALLERGIES: Latex; Epinephrine; Effexor; Amoxicillin; and Citalopram  Family History  Problem Relation Age of Onset  . Hypertension Mother     PMH  . Alcohol abuse Mother   . Alzheimer's disease Maternal Grandmother   . Hypertension Maternal Grandmother   . Anxiety disorder Brother   . Stroke Paternal Uncle   . Alzheimer's disease Paternal Uncle   . Diabetes Neg Hx   . Depression Brother   . Parkinson's disease Mother     Social History   Social History  . Marital Status: Single    Spouse Name: N/A  . Number of Children: N/A  . Years of Education: N/A   Occupational History  . TEXTILE DESIGNER  Social History Main Topics  . Smoking status: Never Smoker   . Smokeless tobacco: Never Used  . Alcohol Use: 3.0 oz/week    5 Standard drinks or equivalent per week     Comment:  occasionally  . Drug Use: No  . Sexual Activity: No   Other Topics Concern  . Not on file   Social History Narrative   GETS REGULAR EXERCISE          ROS:  Pertinent items are noted in HPI.  PHYSICAL EXAMINATION:    BP 150/88 mmHg  Pulse 70  Ht 5' 6.5" (1.689 m)  Wt 133 lb (60.328 kg)  BMI 21.15 kg/m2  LMP 07/19/2008    General appearance: alert, cooperative and appears stated age   Abdomen: soft, non-tender; bowel sounds normal; no masses,  no organomegaly Back:  No CVA tenderness. No abnormal inguinal nodes palpated    Pelvic: External genitalia:  no lesions              Urethra:   normal appearing urethra with no masses, tenderness or lesions              Bartholins and Skenes: normal                 Vagina:  Moderate white yellow discharge.  Atrophy of vaginal walls.  Bleeds with contact with speculum.               Cervix: no lesions          Bimanual Exam:  Uterus:  normal size, contour, position, consistency, mobility, non-tender              Adnexa: normal adnexa and no mass, fullness, tenderness           Chaperone was present for exam.  ASSESSMENT  Left flank pain. Probable muscle strain.  Abnormal urine.  Vaginal discharge.  Atrophy change.  PLAN  Urine micro and culture.  Discussion of atrophic changes. Can use KY jelly to moisturize prn.  Affirm taken.  No treatment until tests are back.  Follow up for annual exam and prn.    An After Visit Summary was printed and given to the patient.  __15____ minutes face to face time of which over 50% was spent in counseling.

## 2015-08-12 LAB — WET PREP BY MOLECULAR PROBE
Candida species: NEGATIVE
GARDNERELLA VAGINALIS: POSITIVE — AB
TRICHOMONAS VAG: NEGATIVE

## 2015-08-12 LAB — URINALYSIS, MICROSCOPIC ONLY
Bacteria, UA: NONE SEEN [HPF]
CRYSTALS: NONE SEEN [HPF]
Casts: NONE SEEN [LPF]
RBC / HPF: NONE SEEN RBC/HPF (ref <=2)
Squamous Epithelial / LPF: NONE SEEN [HPF] (ref <=5)
Yeast: NONE SEEN [HPF]

## 2015-08-12 LAB — URINE CULTURE
COLONY COUNT: NO GROWTH
Organism ID, Bacteria: NO GROWTH

## 2015-08-13 ENCOUNTER — Telehealth: Payer: Self-pay

## 2015-08-13 MED ORDER — CLINDAMYCIN PHOSPHATE 100 MG VA SUPP: 100.0000 mg | Freq: Every day | VAGINAL | Status: DC

## 2015-08-13 NOTE — Telephone Encounter (Signed)
-----   Message from Nunzio Cobbs, MD sent at 08/13/2015  7:18 AM EST ----- Please report vaginitis exam showing bacterial vaginosis, a form of vaginitis.  I am recommending Clindamycin 100 mg vaginal ovules q hs for one week.  Dispense - 7, RF none.  Please sent to pharmacy of choice.   There is no sign of bladder infection.   Cc - Marisa Sprinkles

## 2015-08-13 NOTE — Telephone Encounter (Signed)
Spoke with patient. Results given as seen below from [place]. Patient is agreeable and verbalizes understanding. Rx for Clindamycin 100 mg vaginal suppositories q hs for 1 week #7 0RF sent to pharmacy on file.  Routing to provider for final review. Patient agreeable to disposition. Will close encounter.

## 2015-08-13 NOTE — Telephone Encounter (Signed)
Patient is returning a call to Kaitlyn. °

## 2015-08-13 NOTE — Telephone Encounter (Signed)
Left message to call Anita Schmidt at [phone]. 

## 2015-08-15 ENCOUNTER — Telehealth: Payer: Self-pay | Admitting: Obstetrics and Gynecology

## 2015-08-15 MED ORDER — METRONIDAZOLE 0.75 % VA GEL: 1.0000 | Freq: Every day | VAGINAL | Status: DC

## 2015-08-15 NOTE — Telephone Encounter (Signed)
Spoke with patient. Advised alternative med Metrogel 0.75 % pv at hs for 5 nights has been sent to [facility] on file. ETOH precautions given.  Routing to provider for final review. Patient agreeable to disposition. Will close encounter.

## 2015-08-15 NOTE — Telephone Encounter (Signed)
Rx for Clindamycin 100 mg suppositories #7 0RF sent was sent to pharmacy on file on 08/13/2015 for treatment of BV. Patient is requesting an alternative due to cost. Routing to Dr.Silva for review.

## 2015-08-15 NOTE — Telephone Encounter (Signed)
OK for Metrogel 0.75% pv at hs for 5 nights.  Avoid ETOH.

## 2015-08-15 NOTE — Telephone Encounter (Signed)
Encounter opened in error

## 2015-08-15 NOTE — Telephone Encounter (Addendum)
Patient asking to request alternative of medication that was sent in 08/13/15 due to cost. Best # to reach: [id number] - [place], [place] AT [address] [id number] Phone: [phone] Fax: [phone]

## 2015-08-15 NOTE — Telephone Encounter (Signed)
Patient returned call with concerns about using Metrogel. "I read that it can cause you to have nausea and vomiting. I am always so anxious that even thinking about it will make me throw up. Advised patient per up to date the chance of a patient having nausea and vomiting from Metrogel is 2-4 %. Advised it is important not to drink any alcohol while using this medication ask this can cause nausea and vomiting. Advised if she notices any GI symptoms she will need to contact the office. Patient states "I am not currently having any symptoms. I am not having vaginal discharge, irritation, or a smell." Denies any current pelvic pain. Patient would like to wait to monitor for any symptoms prior to starting the Metrogel. She is aware if symptoms develops she may use the Metrogel. If she develops any new symptoms she will need to contact the office. Patient is agreeable.  Routing to Dr.Silva as FYI. Encounter previously closed.

## 2015-12-18 ENCOUNTER — Ambulatory Visit (INDEPENDENT_AMBULATORY_CARE_PROVIDER_SITE_OTHER): Payer: 59 | Admitting: Obstetrics and Gynecology

## 2015-12-18 ENCOUNTER — Encounter: Payer: Self-pay | Admitting: Obstetrics and Gynecology

## 2015-12-18 VITALS — BP 120/88 | HR 80 | Resp 18 | Ht 66.0 in | Wt 132.6 lb

## 2015-12-18 DIAGNOSIS — Z01419 Encounter for gynecological examination (general) (routine) without abnormal findings: Secondary | ICD-10-CM

## 2015-12-18 DIAGNOSIS — Z0000 Encounter for general adult medical examination without abnormal findings: Secondary | ICD-10-CM | POA: Diagnosis not present

## 2015-12-18 DIAGNOSIS — N760 Acute vaginitis: Secondary | ICD-10-CM | POA: Diagnosis not present

## 2015-12-18 LAB — POCT URINALYSIS DIPSTICK
Bilirubin, UA: NEGATIVE
Blood, UA: NEGATIVE
Glucose, UA: NEGATIVE
Ketones, UA: NEGATIVE
Nitrite, UA: NEGATIVE
Protein, UA: NEGATIVE
Urobilinogen, UA: NEGATIVE
pH, UA: 5

## 2015-12-18 NOTE — Progress Notes (Signed)
Patient ID: Anita Schmidt, female   DOB: [birth year], 60 y.o.   MRN: [id number] 60 y.o. G55P0020 Single Caucasian female here for annual exam.    Did not use the Metrogel for bacterial vaginosis after her last visit.  No vaginal odor, itching or burning.  Did not want to take antibiotic medication for BV.  Eating more yogurt.  PCP:  [facility]   Patient's last menstrual period was 07/19/2008.           Sexually active: No.  The current method of family planning is post menopausal status.    Exercising: Yes.    walking 7days/week 2.20mles/day. Smoker:  no  Health Maintenance: Pap:  11-23-12 Neg:Neg HR HPV History of abnormal Pap:  no MMG:  03-06-15 Density B/Neg/2 bx markers in Lt.Br.stable/BiRads[facility] Colonoscopy:  2010 normal with Dr. PRosanna Randydue 2020 BMD:   2012  Result  Normal with Dr. PSharlett IlesTDaP:  Up to date with PCP Gardasil:   N/A HIV:  Will consider with PCP. Hep C:  Will do with PCP.  Screening Labs:  Hb today: PCP or work, Urine today: 2+WBCs--pt.asymptomatic(c/o slight vag.d/c).     reports that she has never smoked. She has never used smokeless tobacco. She reports that she drinks about 6.0 oz of alcohol per week. She reports that she does not use illicit drugs.  Past Medical History  Diagnosis Date  . Fibrocystic breast disease   . Gilbert's syndrome   . Hyperlipidemia   . Anxiety disorder   . Hemochromatosis   . Skin cancer, basal cell     nose  . Anxiety     severe    Past Surgical History  Procedure Laterality Date  . Dilation and curettage of uterus    . Tonsillectomy    . Inguinal hernia repair    . Tracer pellets  2003    in breast; [place] . Breast biopsy    . Colonscopy  2011    Tics ; Dr PSharlett Iles . Mohs surgery  2012    nose    No current outpatient prescriptions on file.   No current facility-administered medications for this visit.    Family History  Problem Relation Age of Onset  . Hypertension  Mother     PMH  . Alcohol abuse Mother   . Alzheimer's disease Maternal Grandmother   . Hypertension Maternal Grandmother   . Anxiety disorder Brother   . Stroke Paternal Uncle   . Alzheimer's disease Paternal Uncle   . Diabetes Neg Hx   . Depression Brother   . Parkinson's disease Mother     ROS:  Pertinent items are noted in HPI.  Otherwise, a comprehensive ROS was negative.  Exam:   BP 120/88 mmHg  Pulse 80  Resp 18  Ht 5' 6"  (1.676 m)  Wt 132 lb 9.6 oz (60.147 kg)  BMI 21.41 kg/m2  LMP 07/19/2008    General appearance: alert, cooperative and appears stated age Head: Normocephalic, without obvious abnormality, atraumatic Neck: no adenopathy, supple, symmetrical, trachea midline and thyroid normal to inspection and palpation Lungs: clear to auscultation bilaterally Breasts: normal appearance, no masses or tenderness, Inspection negative, No nipple retraction or dimpling, No nipple discharge or bleeding, No axillary or supraclavicular adenopathy Heart: regular rate and rhythm Abdomen:  soft, non-tender; no masses, no organomegaly Extremities: extremities normal, atraumatic, no cyanosis or edema Skin: Skin color, texture, turgor normal. No rashes or lesions Lymph nodes: Cervical, supraclavicular, and axillary nodes  normal. No abnormal inguinal nodes palpated Neurologic: Grossly normal  Pelvic: External genitalia:  no lesions              Urethra:  normal appearing urethra with no masses, tenderness or lesions              Bartholins and Skenes: normal                 Vagina: normal appearing vagina with normal color and light yellow discharge, no lesions              Cervix: no lesions              Pap taken: Yes.   Bimanual Exam:  Uterus:  normal size, contour, position, consistency, mobility, non-tender              Adnexa: no mass, fullness, tenderness              Rectal exam: Yes.  .  Confirms.              Anus:  normal sphincter tone, no lesions.  Redness of skin  around anal opening.   Chaperone was present for exam.  Assessment:   Well woman visit with normal exam. Hx of BV untreated.  Vaginitis today.   Plan: Yearly mammogram recommended after age 48.  Recommended self breast exam.  Pap and HR HPV as above. Discussed Calcium, Vitamin D, regular exercise program including cardiovascular and weight bearing exercise. Labs performed.  Yes.  .   See orders.  Affirm.  Prescription medication(s) given.  No..  Will consider Hylafem if vaginitis tx needed. Follow up annually and prn.       After visit summary provided.

## 2015-12-18 NOTE — Patient Instructions (Signed)
Health Maintenance, Female Adopting a healthy lifestyle and getting preventive care can go a long way to promote health and wellness. Talk with your health care provider about what schedule of regular examinations is right for you. This is a good chance for you to check in with your provider about disease prevention and staying healthy. In between checkups, there are plenty of things you can do on your own. Experts have done a lot of research about which lifestyle changes and preventive measures are most likely to keep you healthy. Ask your health care provider for more information. WEIGHT AND DIET  Eat a healthy diet  Be sure to include plenty of vegetables, fruits, low-fat dairy products, and lean protein.  Do not eat a lot of foods high in solid fats, added sugars, or salt.  Get regular exercise. This is one of the most important things you can do for your health.  Most adults should exercise for at least 150 minutes each week. The exercise should increase your heart rate and make you sweat (moderate-intensity exercise).  Most adults should also do strengthening exercises at least twice a week. This is in addition to the moderate-intensity exercise.  Maintain a healthy weight  Body mass index (BMI) is a measurement that can be used to identify possible weight problems. It estimates body fat based on height and weight. Your health care provider can help determine your BMI and help you achieve or maintain a healthy weight.  For females 20 years of age and older:   A BMI below 18.5 is considered underweight.  A BMI of 18.5 to 24.9 is normal.  A BMI of 25 to 29.9 is considered overweight.  A BMI of 30 and above is considered obese.  Watch levels of cholesterol and blood lipids  You should start having your blood tested for lipids and cholesterol at 62 years of age, then have this test every 5 years.  You may need to have your cholesterol levels checked more often if:  Your lipid  or cholesterol levels are high.  You are older than 62 years of age.  You are at high risk for heart disease.  CANCER SCREENING   Lung Cancer  Lung cancer screening is recommended for adults 55-80 years old who are at high risk for lung cancer because of a history of smoking.  A yearly low-dose CT scan of the lungs is recommended for people who:  Currently smoke.  Have quit within the past 15 years.  Have at least a 30-pack-year history of smoking. A pack year is smoking an average of one pack of cigarettes a day for 1 year.  Yearly screening should continue until it has been 15 years since you quit.  Yearly screening should stop if you develop a health problem that would prevent you from having lung cancer treatment.  Breast Cancer  Practice breast self-awareness. This means understanding how your breasts normally appear and feel.  It also means doing regular breast self-exams. Let your health care provider know about any changes, no matter how small.  If you are in your 20s or 30s, you should have a clinical breast exam (CBE) by a health care provider every 1-3 years as part of a regular health exam.  If you are 40 or older, have a CBE every year. Also consider having a breast X-ray (mammogram) every year.  If you have a family history of breast cancer, talk to your health care provider about genetic screening.  If you   are at high risk for breast cancer, talk to your health care provider about having an MRI and a mammogram every year.  Breast cancer gene (BRCA) assessment is recommended for women who have family members with BRCA-related cancers. BRCA-related cancers include:  Breast.  Ovarian.  Tubal.  Peritoneal cancers.  Results of the assessment will determine the need for genetic counseling and BRCA1 and BRCA2 testing. Cervical Cancer Your health care provider may recommend that you be screened regularly for cancer of the pelvic organs (ovaries, uterus, and  vagina). This screening involves a pelvic examination, including checking for microscopic changes to the surface of your cervix (Pap test). You may be encouraged to have this screening done every 3 years, beginning at age 21.  For women ages 30-65, health care providers may recommend pelvic exams and Pap testing every 3 years, or they may recommend the Pap and pelvic exam, combined with testing for human papilloma virus (HPV), every 5 years. Some types of HPV increase your risk of cervical cancer. Testing for HPV may also be done on women of any age with unclear Pap test results.  Other health care providers may not recommend any screening for nonpregnant women who are considered low risk for pelvic cancer and who do not have symptoms. Ask your health care provider if a screening pelvic exam is right for you.  If you have had past treatment for cervical cancer or a condition that could lead to cancer, you need Pap tests and screening for cancer for at least 20 years after your treatment. If Pap tests have been discontinued, your risk factors (such as having a new sexual partner) need to be reassessed to determine if screening should resume. Some women have medical problems that increase the chance of getting cervical cancer. In these cases, your health care provider may recommend more frequent screening and Pap tests. Colorectal Cancer  This type of cancer can be detected and often prevented.  Routine colorectal cancer screening usually begins at 62 years of age and continues through 62 years of age.  Your health care provider may recommend screening at an earlier age if you have risk factors for colon cancer.  Your health care provider may also recommend using home test kits to check for hidden blood in the stool.  A small camera at the end of a tube can be used to examine your colon directly (sigmoidoscopy or colonoscopy). This is done to check for the earliest forms of colorectal  cancer.  Routine screening usually begins at age 50.  Direct examination of the colon should be repeated every 5-10 years through 62 years of age. However, you may need to be screened more often if early forms of precancerous polyps or small growths are found. Skin Cancer  Check your skin from head to toe regularly.  Tell your health care provider about any new moles or changes in moles, especially if there is a change in a mole's shape or color.  Also tell your health care provider if you have a mole that is larger than the size of a pencil eraser.  Always use sunscreen. Apply sunscreen liberally and repeatedly throughout the day.  Protect yourself by wearing long sleeves, pants, a wide-brimmed hat, and sunglasses whenever you are outside. HEART DISEASE, DIABETES, AND HIGH BLOOD PRESSURE   High blood pressure causes heart disease and increases the risk of stroke. High blood pressure is more likely to develop in:  People who have blood pressure in the high end   of the normal range (130-139/85-89 mm Hg).  People who are overweight or obese.  People who are African American.  If you are 38-23 years of age, have your blood pressure checked every 3-5 years. If you are 61 years of age or older, have your blood pressure checked every year. You should have your blood pressure measured twice--once when you are at a hospital or clinic, and once when you are not at a hospital or clinic. Record the average of the two measurements. To check your blood pressure when you are not at a hospital or clinic, you can use:  An automated blood pressure machine at a pharmacy.  A home blood pressure monitor.  If you are between 45 years and 39 years old, ask your health care provider if you should take aspirin to prevent strokes.  Have regular diabetes screenings. This involves taking a blood sample to check your fasting blood sugar level.  If you are at a normal weight and have a low risk for diabetes,  have this test once every three years after 62 years of age.  If you are overweight and have a high risk for diabetes, consider being tested at a younger age or more often. PREVENTING INFECTION  Hepatitis B  If you have a higher risk for hepatitis B, you should be screened for this virus. You are considered at high risk for hepatitis B if:  You were born in a country where hepatitis B is common. Ask your health care provider which countries are considered high risk.  Your parents were born in a high-risk country, and you have not been immunized against hepatitis B (hepatitis B vaccine).  You have HIV or AIDS.  You use needles to inject street drugs.  You live with someone who has hepatitis B.  You have had sex with someone who has hepatitis B.  You get hemodialysis treatment.  You take certain medicines for conditions, including cancer, organ transplantation, and autoimmune conditions. Hepatitis C  Blood testing is recommended for:  Everyone born from [birth year] through 1967.  Anyone with known risk factors for hepatitis C. Sexually transmitted infections (STIs)  You should be screened for sexually transmitted infections (STIs) including gonorrhea and chlamydia if:  You are sexually active and are younger than 62 years of age.  You are older than 62 years of age and your health care provider tells you that you are at risk for this type of infection.  Your sexual activity has changed since you were last screened and you are at an increased risk for chlamydia or gonorrhea. Ask your health care provider if you are at risk.  If you do not have HIV, but are at risk, it may be recommended that you take a prescription medicine daily to prevent HIV infection. This is called pre-exposure prophylaxis (PrEP). You are considered at risk if:  You are sexually active and do not regularly use condoms or know the HIV status of your partner(s).  You take drugs by injection.  You are sexually  active with a partner who has HIV. Talk with your health care provider about whether you are at high risk of being infected with HIV. If you choose to begin PrEP, you should first be tested for HIV. You should then be tested every 3 months for as long as you are taking PrEP.  PREGNANCY   If you are premenopausal and you may become pregnant, ask your health care provider about preconception counseling.  If you may  become pregnant, take 400 to 800 micrograms (mcg) of folic acid every day.  If you want to prevent pregnancy, talk to your health care provider about birth control (contraception). OSTEOPOROSIS AND MENOPAUSE   Osteoporosis is a disease in which the bones lose minerals and strength with aging. This can result in serious bone fractures. Your risk for osteoporosis can be identified using a bone density scan.  If you are 61 years of age or older, or if you are at risk for osteoporosis and fractures, ask your health care provider if you should be screened.  Ask your health care provider whether you should take a calcium or vitamin D supplement to lower your risk for osteoporosis.  Menopause may have certain physical symptoms and risks.  Hormone replacement therapy may reduce some of these symptoms and risks. Talk to your health care provider about whether hormone replacement therapy is right for you.  HOME CARE INSTRUCTIONS   Schedule regular health, dental, and eye exams.  Stay current with your immunizations.   Do not use any tobacco products including cigarettes, chewing tobacco, or electronic cigarettes.  If you are pregnant, do not drink alcohol.  If you are breastfeeding, limit how much and how often you drink alcohol.  Limit alcohol intake to no more than 1 drink per day for nonpregnant women. One drink equals 12 ounces of beer, 5 ounces of wine, or 1 ounces of hard liquor.  Do not use street drugs.  Do not share needles.  Ask your health care provider for help if  you need support or information about quitting drugs.  Tell your health care provider if you often feel depressed.  Tell your health care provider if you have ever been abused or do not feel safe at home.   This information is not intended to replace advice given to you by your health care provider. Make sure you discuss any questions you have with your health care provider.   Document Released: 01/18/2011 Document Revised: 07/26/2014 Document Reviewed: 06/06/2013 Elsevier Interactive Patient Education [employer].

## 2015-12-19 ENCOUNTER — Other Ambulatory Visit: Payer: Self-pay | Admitting: Obstetrics and Gynecology

## 2015-12-19 LAB — WET PREP BY MOLECULAR PROBE
Candida species: NEGATIVE
GARDNERELLA VAGINALIS: POSITIVE — AB
Trichomonas vaginosis: NEGATIVE

## 2015-12-19 MED ORDER — HYLAFEM VA SUPP: 1.0000 | Freq: Every day | VAGINAL | Status: DC

## 2015-12-23 LAB — IPS PAP TEST WITH HPV

## 2016-02-16 ENCOUNTER — Other Ambulatory Visit: Payer: Self-pay | Admitting: Obstetrics and Gynecology

## 2016-02-16 DIAGNOSIS — Z1231 Encounter for screening mammogram for malignant neoplasm of breast: Secondary | ICD-10-CM

## 2016-02-28 ENCOUNTER — Encounter: Payer: Self-pay | Admitting: Obstetrics and Gynecology

## 2016-03-08 ENCOUNTER — Ambulatory Visit
Admission: RE | Admit: 2016-03-08 | Discharge: 2016-03-08 | Disposition: A | Discharge status: Home / Self Care | Payer: 59 | Source: Ambulatory Visit | Attending: Obstetrics and Gynecology | Admitting: Obstetrics and Gynecology

## 2016-03-08 DIAGNOSIS — Z1231 Encounter for screening mammogram for malignant neoplasm of breast: Secondary | ICD-10-CM

## 2016-03-10 ENCOUNTER — Telehealth: Payer: Self-pay

## 2016-03-10 NOTE — Telephone Encounter (Signed)
Spoke to pt:   New PCP is at [place].

## 2016-03-11 ENCOUNTER — Other Ambulatory Visit: Payer: Self-pay | Admitting: Internal Medicine

## 2016-03-11 ENCOUNTER — Other Ambulatory Visit (INDEPENDENT_AMBULATORY_CARE_PROVIDER_SITE_OTHER): Payer: 59

## 2016-03-11 ENCOUNTER — Encounter: Payer: Self-pay | Admitting: Internal Medicine

## 2016-03-11 ENCOUNTER — Ambulatory Visit (INDEPENDENT_AMBULATORY_CARE_PROVIDER_SITE_OTHER): Payer: 59 | Admitting: Internal Medicine

## 2016-03-11 DIAGNOSIS — Z0001 Encounter for general adult medical examination with abnormal findings: Secondary | ICD-10-CM | POA: Insufficient documentation

## 2016-03-11 DIAGNOSIS — Z0000 Encounter for general adult medical examination without abnormal findings: Secondary | ICD-10-CM | POA: Diagnosis not present

## 2016-03-11 DIAGNOSIS — F411 Generalized anxiety disorder: Secondary | ICD-10-CM | POA: Diagnosis not present

## 2016-03-11 DIAGNOSIS — R030 Elevated blood-pressure reading, without diagnosis of hypertension: Secondary | ICD-10-CM

## 2016-03-11 LAB — CBC WITH DIFFERENTIAL/PLATELET
Basophils Absolute: 0 10*3/uL (ref 0.0–0.1)
Basophils Relative: 0.6 % (ref 0.0–3.0)
EOS PCT: 1.1 % (ref 0.0–5.0)
Eosinophils Absolute: 0.1 10*3/uL (ref 0.0–0.7)
HEMATOCRIT: 42 % (ref 36.0–46.0)
HEMOGLOBIN: 14.6 g/dL (ref 12.0–15.0)
Lymphocytes Relative: 38.5 % (ref 12.0–46.0)
Lymphs Abs: 2 10*3/uL (ref 0.7–4.0)
MCHC: 34.7 g/dL (ref 30.0–36.0)
MCV: 92.2 fl (ref 78.0–100.0)
MONOS PCT: 9.8 % (ref 3.0–12.0)
Monocytes Absolute: 0.5 10*3/uL (ref 0.1–1.0)
Neutro Abs: 2.6 10*3/uL (ref 1.4–7.7)
Neutrophils Relative %: 50 % (ref 43.0–77.0)
Platelets: 338 10*3/uL (ref 150.0–400.0)
RBC: 4.56 Mil/uL (ref 3.87–5.11)
RDW: 12.2 % (ref 11.5–15.5)
WBC: 5.2 10*3/uL (ref 4.0–10.5)

## 2016-03-11 LAB — LIPID PANEL
CHOL/HDL RATIO: 3
CHOLESTEROL: 181 mg/dL (ref 0–200)
HDL: 66.5 mg/dL (ref >39.00)
LDL CALC: 93 mg/dL (ref 0–99)
NonHDL: 114.02
TRIGLYCERIDES: 106 mg/dL (ref 0.0–149.0)
VLDL: 21.2 mg/dL (ref 0.0–40.0)

## 2016-03-11 LAB — IBC PANEL
Iron: 164 ug/dL — ABNORMAL HIGH (ref 42–145)
Saturation Ratios: 61.3 % — ABNORMAL HIGH (ref 20.0–50.0)
Transferrin: 191 mg/dL — ABNORMAL LOW (ref 212.0–360.0)

## 2016-03-11 LAB — HEPATIC FUNCTION PANEL
ALBUMIN: 4.4 g/dL (ref 3.5–5.2)
ALT: 9 U/L (ref 0–35)
AST: 13 U/L (ref 0–37)
Alkaline Phosphatase: 52 U/L (ref 39–117)
BILIRUBIN TOTAL: 0.5 mg/dL (ref 0.2–1.2)
Bilirubin, Direct: 0.1 mg/dL (ref 0.0–0.3)
Total Protein: 6.8 g/dL (ref 6.0–8.3)

## 2016-03-11 LAB — URINALYSIS, ROUTINE W REFLEX MICROSCOPIC
BILIRUBIN URINE: NEGATIVE
KETONES UR: NEGATIVE
LEUKOCYTES UA: NEGATIVE
NITRITE: NEGATIVE
PH: 6 (ref 5.0–8.0)
Specific Gravity, Urine: 1.025 (ref 1.000–1.030)
Total Protein, Urine: NEGATIVE
URINE GLUCOSE: NEGATIVE
Urobilinogen, UA: 0.2 (ref 0.0–1.0)

## 2016-03-11 LAB — BASIC METABOLIC PANEL
BUN: 16 mg/dL (ref 6–23)
CALCIUM: 9.1 mg/dL (ref 8.4–10.5)
CO2: 32 mEq/L (ref 19–32)
Chloride: 105 mEq/L (ref 96–112)
Creatinine, Ser: 0.79 mg/dL (ref 0.40–1.20)
GFR: 78.42 mL/min (ref >60.00)
GLUCOSE: 96 mg/dL (ref 70–99)
Potassium: 4.2 mEq/L (ref 3.5–5.1)
SODIUM: 142 meq/L (ref 135–145)

## 2016-03-11 LAB — FERRITIN: Ferritin: 193.5 ng/mL (ref 10.0–291.0)

## 2016-03-11 LAB — TSH: TSH: 1.79 u[IU]/mL (ref 0.35–4.50)

## 2016-03-11 NOTE — Patient Instructions (Addendum)

## 2016-03-11 NOTE — Progress Notes (Signed)
Subjective:    Patient ID: Anita Schmidt, female    DOB: 09-10-1955, 60 y.o.   MRN: [id number]  HPI Here for wellness and f/u;  Overall doing ok;  Pt denies Chest pain, worsening SOB, DOE, wheezing, orthopnea, PND, worsening LE edema, palpitations, dizziness or syncope.  Pt denies neurological change such as new headache, facial or extremity weakness.  Pt denies polydipsia, polyuria, or low sugar symptoms. Pt states overall good compliance with treatment and medications, good tolerability, and has been trying to follow appropriate diet.  Pt denies worsening depressive symptoms, suicidal ideation or panic. No fever, night sweats, wt loss, loss of appetite, or other constitutional symptoms.  Pt states good ability with ADL's, has low fall risk, home safety reviewed and adequate, no other significant changes in hearing or vision, and only occasionally active with exercise. Declines flu shot today. Due for hemochromatosis labs, Past Medical History:  Diagnosis Date  . Anxiety    severe  . Anxiety disorder   . Fibrocystic breast disease   . Gilbert's syndrome   . Hemochromatosis   . Hyperlipidemia   . Skin cancer, basal cell    nose   Past Surgical History:  Procedure Laterality Date  . BREAST BIOPSY    . colonscopy  2011   Tics ; Dr Sharlett Iles  . DILATION AND CURETTAGE OF UTERUS    . INGUINAL HERNIA REPAIR    . MOHS SURGERY  2012   nose  . TONSILLECTOMY    . Tracer Pellets  2003   in breast; [place]    reports that she has never smoked. She has never used smokeless tobacco. She reports that she drinks about 6.0 oz of alcohol per week . She reports that she does not use drugs. family history includes Alcohol abuse in her mother; Alzheimer's disease in her maternal grandmother and paternal uncle; Anxiety disorder in her brother; Depression in her brother; Hypertension in her maternal grandmother and mother; Parkinson's disease in her mother; Stroke in her paternal uncle. Allergies    Allergen Reactions  . Latex Itching    Redness & itching Because of a history of documented adverse serious drug reaction;Medi Alert bracelet  is recommended  . Epinephrine     ? jittery  . Effexor [Venlafaxine]     Shaking, elevated BP,full blown panic attack  Hypertension with systolic blood pressure [id number] with severe nausea  . Amoxicillin     REACTION: GI UPSET  . Citalopram     ? nausea   Current Outpatient Prescriptions on File Prior to Visit  Medication Sig Dispense Refill  . Homeopathic Products (HYLAFEM) SUPP Place 1 suppository vaginally at bedtime. Use for 3 nights.  You may repeat the dosing for an additional 3 nights if symptoms persist. 6 suppository 0   No current facility-administered medications on file prior to visit.    Review of Systems Constitutional: Negative for increased diaphoresis, or other activity, appetite or siginficant weight change other than noted HENT: Negative for worsening hearing loss, ear pain, facial swelling, mouth sores and neck stiffness.   Eyes: Negative for other worsening pain, redness or visual disturbance.  Respiratory: Negative for choking or stridor Cardiovascular: Negative for other chest pain and palpitations.  Gastrointestinal: Negative for worsening diarrhea, blood in stool, or abdominal distention Genitourinary: Negative for hematuria, flank pain or change in urine volume.  Musculoskeletal: Negative for myalgias or other joint complaints.  Skin: Negative for other color change and wound or drainage.  Neurological: Negative for syncope  and numbness. other than noted Hematological: Negative for adenopathy. or other swelling Psychiatric/Behavioral: Negative for hallucinations, SI, self-injury, decreased concentration or other worsening agitation.      Objective:   Physical Exam BP 128/76   Pulse 77   Temp 98.2 F (36.8 C) (Oral)   Resp 20   Wt 131 lb (59.4 kg)   LMP 07/19/2008   SpO2 98%   BMI 21.14 kg/m  VS noted,   Constitutional: Pt is oriented to person, place, and time. Appears well-developed and well-nourished, in no significant distress Head: Normocephalic and atraumatic  Eyes: Conjunctivae and EOM are normal. Pupils are equal, round, and reactive to light Right Ear: External ear normal.  Left Ear: External ear normal Nose: Nose normal.  Mouth/Throat: Oropharynx is clear and moist  Neck: Normal range of motion. Neck supple. No JVD present. No tracheal deviation present or significant neck LA or mass Cardiovascular: Normal rate, regular rhythm, normal heart sounds and intact distal pulses.   Pulmonary/Chest: Effort normal and breath sounds without rales or wheezing  Abdominal: Soft. Bowel sounds are normal. NT. No HSM  Musculoskeletal: Normal range of motion. Exhibits no edema Lymphadenopathy: Has no cervical adenopathy.  Neurological: Pt is alert and oriented to person, place, and time. Pt has normal reflexes. No cranial nerve deficit. Motor grossly intact Skin: Skin is warm and dry. No rash noted or new ulcers Psychiatric:  Has normal mood and affect. Behavior is normal.      Assessment & Plan:

## 2016-03-11 NOTE — Assessment & Plan Note (Signed)
Stable, to cont yoga

## 2016-03-11 NOTE — Progress Notes (Signed)
Pre visit review using our clinic review tool, if applicable. No additional management support is needed unless otherwise documented below in the visit note. 

## 2016-03-11 NOTE — Assessment & Plan Note (Signed)
[place] for f/u labs,  to f/u any worsening symptoms or concerns

## 2016-03-11 NOTE — Assessment & Plan Note (Signed)

## 2016-03-11 NOTE — Assessment & Plan Note (Signed)
stable overall by history and exam, recent data reviewed with pt, and pt to continue medical treatment as before,  to f/u any worsening symptoms or concerns BP Readings from Last 3 Encounters:  03/11/16 128/76  12/18/15 120/88  08/11/15 (!) 150/88

## 2016-03-16 ENCOUNTER — Encounter: Payer: Self-pay | Admitting: Internal Medicine

## 2016-03-26 ENCOUNTER — Encounter: Payer: Self-pay | Admitting: Gastroenterology

## 2016-05-26 ENCOUNTER — Other Ambulatory Visit: Payer: Self-pay | Admitting: Obstetrics and Gynecology

## 2016-05-26 NOTE — Telephone Encounter (Signed)
Patient is requesting a refill for Hylafem. She is using Public librarian at [facility].

## 2016-05-26 NOTE — Telephone Encounter (Signed)
Medication refill request: hylafem  Last AEX:  12/18/15  Next AEX: 12/20/16  Last MMG (if hormonal medication request): 03/08/16 BIRADS1:Neg  Refill authorized: 12/19/15 #[id number]. Today please advise.

## 2016-05-27 ENCOUNTER — Encounter: Payer: Self-pay | Admitting: Obstetrics and Gynecology

## 2016-05-27 ENCOUNTER — Telehealth: Payer: Self-pay | Admitting: Obstetrics and Gynecology

## 2016-05-27 ENCOUNTER — Ambulatory Visit (INDEPENDENT_AMBULATORY_CARE_PROVIDER_SITE_OTHER): Payer: 59 | Admitting: Obstetrics and Gynecology

## 2016-05-27 VITALS — BP 142/84 | HR 70 | Ht 66.5 in | Wt 132.2 lb

## 2016-05-27 DIAGNOSIS — N760 Acute vaginitis: Secondary | ICD-10-CM

## 2016-05-27 NOTE — Telephone Encounter (Signed)
See refill encounter from 05/26/16

## 2016-05-27 NOTE — Telephone Encounter (Signed)
Called patient. She made appt 05/27/16 @2 :30pm

## 2016-05-27 NOTE — Progress Notes (Signed)
GYNECOLOGY  VISIT   HPI: 60 y.o.   Single  Caucasian  female   [id number] with Patient's last menstrual period was 07/19/2008.   here for vaginal discharge. Patient denies itching or odor.  No burning.  Notes the white discharge in the am only.   Used Hylafem but used two pills and then one got stuck.  Would like this again if needed.  No recent antibiotics.  Does not douche.  Not sexually active.   GYNECOLOGIC HISTORY: Patient's last menstrual period was 07/19/2008. Contraception:  Postmenopausal Menopausal hormone therapy:  none Last mammogram:  03-08-16 Density B/Neg/BiRads1:[facility] Last pap smear:   12-18-15 Neg:Neg HR HPV        OB History    Gravida Para Term Preterm AB Living   2       2 0   SAB TAB Ectopic Multiple Live Births                     Patient Active Problem List   Diagnosis Date Noted  . Preventative health care 03/11/2016  . Generalized anxiety disorder 05/24/2013  . Elevated blood pressure reading without diagnosis of hypertension 05/23/2013  . Diverticulosis of colon without hemorrhage 04/27/2013  . Basal cell cancer 04/27/2013  . Hemochromatosis   . Bee sting allergy 03/26/2011  . IBS 02/10/2010  . Anxiety state 12/25/2008  . [place] DISEASE, CERVICAL 11/19/2008  . RHINITIS 09/19/2008  . HYPERLIPIDEMIA 08/01/2008  . 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 MENOPAUSAL SYNDROME 08/01/2008  . FIBROCYSTIC BREAST DISEASE, HX OF 08/01/2008    Past Medical History:  Diagnosis Date  . Anxiety    severe  . Anxiety disorder   . Fibrocystic breast disease   . Gilbert's syndrome   . Hemochromatosis   . Hyperlipidemia   . Skin cancer, basal cell    nose    Past Surgical History:  Procedure Laterality Date  . BREAST BIOPSY    . colonscopy  2011   Tics ; Dr Sharlett Iles  . DILATION AND CURETTAGE OF UTERUS    . INGUINAL HERNIA REPAIR    . MOHS SURGERY  2012   nose  . TONSILLECTOMY    . Tracer Pellets  2003   in breast; [facility]    No current  outpatient prescriptions on file.   No current facility-administered medications for this visit.      ALLERGIES: Latex; Epinephrine; Effexor [venlafaxine]; Amoxicillin; and Citalopram  Family History  Problem Relation Age of Onset  . Hypertension Mother     PMH  . Alcohol abuse Mother   . Alzheimer's disease Maternal Grandmother   . Hypertension Maternal Grandmother   . Anxiety disorder Brother   . Stroke Paternal Uncle   . Alzheimer's disease Paternal Uncle   . Diabetes Neg Hx   . Depression Brother   . Parkinson's disease Mother     Social History   Social History  . Marital status: Single    Spouse name: N/A  . Number of children: N/A  . Years of education: N/A   Occupational History  . 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 Smoking status: Never Smoker  . Smokeless tobacco: Never Used  . Alcohol use 6.0 oz/week    10 Standard drinks or equivalent per week     Comment: drinks 10--4oz drinks/week  . Drug use: No  . Sexual activity: No   Other Topics Concern  . Not on file   Social History Narrative  GETS REGULAR EXERCISE          ROS:  Pertinent items are noted in HPI.  PHYSICAL EXAMINATION:    BP (!) 142/84 (BP Location: Right Arm, Patient Position: Sitting, Cuff Size: Normal)   Pulse 70   Ht 5' 6.5" (1.689 m)   Wt 132 lb 3.2 oz (60 kg)   LMP 07/19/2008   BMI 21.02 kg/m     General appearance: alert, cooperative and appears stated age  Pelvic: External genitalia:  no lesions              Urethra:  normal appearing urethra with no masses, tenderness or lesions              Bartholins and Skenes: normal                 Vagina: normal appearing vagina with normal color and thin white discharge, no lesions.  Mild erythema of the vagina.              Cervix: no lesions                Bimanual Exam:  Uterus:  normal size, contour, position, consistency, mobility, non-tender              Adnexa: no mass, fullness, tenderness          Chaperone was present for exam.  ASSESSMENT  Vaginitis.  I think part of the symptoms are due to atrophy.   PLAN  Discussion of vaginitis. Affirm taken today. If BV confirmed, she would like to try Hylafem again and do a course for 6 nights.  She would consider Flagyl if the Hylafem does not give results.   An After Visit Summary was printed and given to the patient.  ___15___ minutes face to face time of which over 50% was spent in counseling.

## 2016-05-27 NOTE — Telephone Encounter (Signed)
Patient is returning a call to [place].

## 2016-05-27 NOTE — Telephone Encounter (Signed)
Left voicemail to call back re: Rx.  Patient needs appt.to get refill. - per Dr. Quincy Simmonds

## 2016-05-28 LAB — WET PREP BY MOLECULAR PROBE
Candida species: NEGATIVE
GARDNERELLA VAGINALIS: POSITIVE — AB
Trichomonas vaginosis: NEGATIVE

## 2016-05-28 MED ORDER — HYLAFEM VA SUPP: 1.0000 | Freq: Every day | VAGINAL | 0 refills | Status: DC

## 2016-05-28 NOTE — Addendum Note (Signed)
Addended by: Yisroel Ramming, Dietrich Pates E on: 05/28/2016 06:57 AM   Modules accepted: Orders

## 2016-06-03 ENCOUNTER — Other Ambulatory Visit (INDEPENDENT_AMBULATORY_CARE_PROVIDER_SITE_OTHER): Payer: 59

## 2016-06-03 ENCOUNTER — Encounter: Payer: Self-pay | Admitting: Gastroenterology

## 2016-06-03 ENCOUNTER — Ambulatory Visit (INDEPENDENT_AMBULATORY_CARE_PROVIDER_SITE_OTHER): Payer: 59 | Admitting: Gastroenterology

## 2016-06-03 LAB — HEMOGLOBIN A1C: Hgb A1c MFr Bld: 5.7 % (ref 4.6–6.5)

## 2016-06-03 NOTE — Progress Notes (Signed)
HPI :  60 y/o female with PMH of anxiety, HLD, and reported hemochromatosis, former patient with Dr Sharlett Iles, followed for hemochromatosis. She is a new patient to me, last seen in 2011. 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 Historically her ferritin levels have been < 500 and ALT level has been normal. Last ferritin level of 193 on Aug 24th. ALT was 9.   She was diagnosed about 8 years ago. She reported phlebotomy one time remotely but none since then. She denies any chronic phlebotomy. She denies a history of DM but had Hgb A1c in high 5s in the past. She denies any FH of cirrhosis or liver disease. She denies any children. She has siblings who have been screened. She denies any heart disease. She reports she previously drank a bottle of wine per night for 3-5 years, but for the past 10 years drinks only one glass of wine per week. She denies any vitamin C supplements or high intake of vitamin C.  Colonoscopy 03/16/2010 - severe diverticulosis     Past Medical History:  Diagnosis Date  . Anxiety    severe  . Anxiety disorder   . Fibrocystic breast disease   . Gilbert's syndrome   . Hemochromatosis   . Hyperlipidemia   . Skin cancer, basal cell    nose     Past Surgical History:  Procedure Laterality Date  . BREAST BIOPSY    . colonscopy  2011   Tics ; Dr Sharlett Iles  . DILATION AND CURETTAGE OF UTERUS    . INGUINAL HERNIA REPAIR    . MOHS SURGERY  2012   nose  . TONSILLECTOMY    . Tracer Pellets  2003   in breast; [place]   Family History  Problem Relation Age of Onset  . Hypertension Mother     PMH  . Alcohol abuse Mother   . Parkinson's disease Mother   . Anxiety disorder Brother   . Alzheimer's disease Maternal Grandmother   . Hypertension Maternal Grandmother   . Stroke Paternal Uncle   . Alzheimer's disease Paternal Uncle   . Depression Brother   . Diabetes Neg Hx    Social History  Substance Use Topics  . Smoking status: Never Smoker  .  Smokeless tobacco: Never Used  . Alcohol use 6.0 oz/week    10 Standard drinks or equivalent per week     Comment: drinks 10--4oz drinks/week   No current outpatient prescriptions on file.   No current facility-administered medications for this visit.    Allergies  Allergen Reactions  . Latex Itching    Redness & itching Because of a history of documented adverse serious drug reaction;Medi Alert bracelet  is recommended  . Epinephrine     ? jittery  . Effexor [Venlafaxine]     Shaking, elevated BP,full blown panic attack  Hypertension with systolic blood pressure [id number] with severe nausea  . Amoxicillin     REACTION: GI UPSET  . Citalopram     ? nausea     Review of Systems: All systems reviewed and negative except where noted in HPI.    Lab Results  Component Value Date   WBC 5.2 03/11/2016   HGB 14.6 03/11/2016   HCT 42.0 03/11/2016   MCV 92.2 03/11/2016   PLT 338.0 03/11/2016    Lab Results  Component Value Date   ALT 9 03/11/2016   AST 13 03/11/2016   ALKPHOS 52 03/11/2016   BILITOT 0.5 03/11/2016  Lab Results  Component Value Date   FERRITIN 193.5 03/11/2016    Lab Results  Component Value Date   IRON 164 (H) 03/11/2016   FERRITIN 193.5 03/11/2016     Physical Exam: BP 126/86   Pulse 74   Ht 5' 6.5" (1.689 m)   Wt 132 lb 8 oz (60.1 kg)   LMP 07/19/2008   BMI 21.07 kg/m  Constitutional: Pleasant,well-developed, female in no acute distress. HEENT: Normocephalic and atraumatic. Conjunctivae are normal. No scleral icterus. Neck supple.  Cardiovascular: Normal rate, regular rhythm.  Pulmonary/chest: Effort normal and breath sounds normal. No wheezing, rales or rhonchi. Abdominal: Soft, nondistended, nontender. There are no masses palpable. No hepatomegaly. Extremities: no edema Lymphadenopathy: No cervical adenopathy noted. Neurological: Alert and oriented to person place and time. Skin: Skin is warm and dry. No rashes noted. Psychiatric:  Normal mood and affect. Behavior is normal.   ASSESSMENT AND PLAN: 60 year old female, here to reestablish care for a history of hemachromatosis. Interestingly she has a homozygous C282Y mutation but has not had evidence of iron overload. Her ALT has been historically normal, and ferritins have all been well below 500, and in fact normal, last was 193 and in the normal range. Last EKG 5 years ago, prior DM testing was borderline.   Overall this patient does not have symptoms of iron overload and labs do not show evidence of iron overload. As long as ferritin remains within normal range I don't feel strongly that she needs phlebotomy. If ferritin above 500 then phlebotomy would be recommended. I recommend iron studies and LFTs done at least every year and to see [place] at least every year. I'm going to repeat her A1c to ensure no interval changes from the last exam, previously high 5s. TSH is up to date. She's never had any prior imaging and while I don't think she has iron overload causing any hepatic problems, she did drink heavily in the past. Current labs don't support cirrhosis however I offered her baseline ultrasound of the liver to ensure normal. She agreed with this. I otherwise recommend she minimize her alcohol intake and avoid any routine vitamin C supplements. She'll obtain a baseline EKG the next time she sees her primary care. All questions answered.   Nederland Cellar, MD [facility] Pager [phone]  CC:  Biagio Borg, MD

## 2016-06-03 NOTE — Patient Instructions (Addendum)
Your physician has requested that you go to the basement for the following lab work before leaving today: Hemoglobin A1C  Please make sure to get an EKG next time you see your primary care physician.  Refrain from alcohol and vitamin c.  You have been scheduled for a liver ultrasound at [facility] (1st floor of hospital) on Monday 06/21/16 at 9:00 am. Please arrive 15 minutes prior to your appointment for registration. Make certain not to have anything to eat or drink 6 hours prior to your appointment. Should you need to reschedule your appointment, please contact radiology at [phone]. This test typically takes about 30 minutes to perform.  If you are age 53 or older, your body mass index should be between 23-30. Your Body mass index is 21.07 kg/m. If this is out of the aforementioned range listed, please consider follow up with your Primary Care Provider.  If you are age 74 or younger, your body mass index should be between 19-25. Your Body mass index is 21.07 kg/m. If this is out of the aformentioned range listed, please consider follow up with your Primary Care Provider.

## 2016-06-07 NOTE — Progress Notes (Signed)
Letter mailed

## 2016-06-09 ENCOUNTER — Ambulatory Visit (HOSPITAL_COMMUNITY): Payer: 59

## 2016-06-21 ENCOUNTER — Ambulatory Visit (HOSPITAL_COMMUNITY)
Admission: RE | Admit: 2016-06-21 | Discharge: 2016-06-21 | Disposition: A | Discharge status: Home / Self Care | Payer: 59 | Source: Ambulatory Visit | Attending: Gastroenterology | Admitting: Gastroenterology

## 2016-06-23 ENCOUNTER — Telehealth: Payer: Self-pay | Admitting: Gastroenterology

## 2016-07-01 ENCOUNTER — Encounter: Payer: Self-pay | Admitting: Gastroenterology

## 2016-07-29 NOTE — Telephone Encounter (Signed)
Patient was called back and advised on 06/23/2016. Additional notes under imaging

## 2016-09-06 ENCOUNTER — Encounter: Payer: Self-pay | Admitting: Obstetrics and Gynecology

## 2016-09-22 ENCOUNTER — Encounter: Payer: Self-pay | Admitting: Internal Medicine

## 2016-09-22 DIAGNOSIS — M25561 Pain in right knee: Secondary | ICD-10-CM

## 2016-09-22 NOTE — Telephone Encounter (Signed)
Staff to Please contact pt to offer appt with Dr Tamala Julian for right knee pain, thanks

## 2016-10-05 NOTE — Progress Notes (Deleted)
[facility] [address] [place], [place] [address] Phone: [phone] Subjective:    I'm seeing this patient by the request  of:  Cathlean Cower, MD   CC: knee pain   ATF:[id number]  Anita Schmidt is a 61 y.o. female coming in with complaint of      Past Medical History:  Diagnosis Date  . Anxiety    severe  . Anxiety disorder   . Fibrocystic breast disease   . Gilbert's syndrome   . Hemochromatosis   . Hyperlipidemia   . Skin cancer, basal cell    nose   Past Surgical History:  Procedure Laterality Date  . BREAST BIOPSY    . colonscopy  2011   Tics ; Dr Sharlett Iles  . DILATION AND CURETTAGE OF UTERUS    . INGUINAL HERNIA REPAIR    . MOHS SURGERY  2012   nose  . TONSILLECTOMY    . Tracer Pellets  2003   in breast; [place]   Social History   Social History  . Marital status: Single    Spouse name: N/A  . Number of children: N/A  . Years of education: N/A   Occupational History  . 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 Smoking status: Never Smoker  . Smokeless tobacco: Never Used  . Alcohol use 6.0 oz/week    10 Standard drinks or equivalent per week     Comment: drinks 10--4oz drinks/week  . Drug use: No  . Sexual activity: No   Other Topics Concern  . Not on file   Social History Narrative   GETS REGULAR EXERCISE         Allergies  Allergen Reactions  . Latex Itching    Redness & itching Because of a history of documented adverse serious drug reaction;Medi Alert bracelet  is recommended  . Epinephrine     ? jittery  . Effexor [Venlafaxine]     Shaking, elevated BP,full blown panic attack  Hypertension with systolic blood pressure 706 with severe nausea  . Amoxicillin     REACTION: GI UPSET  . Citalopram     ? nausea   Family History  Problem Relation Age of Onset  . Hypertension Mother     PMH  . Alcohol abuse Mother   . Parkinson's disease Mother   . Anxiety disorder Brother   .  Alzheimer's disease Maternal Grandmother   . Hypertension Maternal Grandmother   . Stroke Paternal Uncle   . Alzheimer's disease Paternal Uncle   . Depression Brother   . Diabetes Neg Hx     Past medical history, social, surgical and family history all reviewed in electronic medical record.  No pertanent information unless stated regarding to the chief complaint.   Review of Systems:Review of systems updated and as accurate as of 10/05/16  No headache, visual changes, nausea, vomiting, diarrhea, constipation, dizziness, abdominal pain, skin rash, fevers, chills, night sweats, weight loss, swollen lymph nodes, body aches, joint swelling, muscle aches, chest pain, shortness of breath, mood changes.   Objective  Last menstrual period 07/19/2008. Systems examined below as of 10/05/16   General: No apparent distress alert and oriented x3 mood and affect normal, dressed appropriately.  HEENT: Pupils equal, extraocular movements intact  Respiratory: Patient's speak in full sentences and does not appear short of breath  Cardiovascular: No lower extremity edema, non tender, no erythema  Skin: Warm dry intact with no signs of infection or  rash on extremities or on axial skeleton.  Abdomen: Soft nontender  Neuro: Cranial nerves II through XII are intact, neurovascularly intact in all extremities with 2+ DTRs and 2+ pulses.  Lymph: No lymphadenopathy of posterior or anterior cervical chain or axillae bilaterally.  Gait normal with good balance and coordination.  MSK:  Non tender with full range of motion and good stability and symmetric strength and tone of shoulders, elbows, wrist, hip, knee and ankles bilaterally.     Impression and Recommendations:     This case required medical decision making of moderate complexity.      Note: This dictation was prepared with Dragon dictation along with smaller phrase technology. Any transcriptional errors that result from this process are  unintentional.

## 2016-10-06 ENCOUNTER — Ambulatory Visit: Payer: Self-pay | Admitting: Family Medicine

## 2016-10-08 ENCOUNTER — Encounter: Payer: Self-pay | Admitting: Obstetrics and Gynecology

## 2016-10-08 ENCOUNTER — Telehealth: Payer: Self-pay | Admitting: *Deleted

## 2016-10-08 MED ORDER — METRONIDAZOLE 500 MG PO TABS: 500.0000 mg | ORAL_TABLET | Freq: Two times a day (BID) | ORAL | 0 refills | Status: DC

## 2016-10-08 NOTE — Telephone Encounter (Signed)
Dr. Quincy Simmonds, please review MyChart message below and advise.  From Mauri Reading Nisley To Nunzio Cobbs, MD Sent 10/08/2016 10:14 AM  Hi Dr. Quincy Simmonds,   I have been waiting for months for the Hylafem to come back in stock. Wouldn't you know that when I need it, they sold out. The website for Hylafem keeps pushing the instock date ahead.   Maybe I should go ahead an take the oral prescription.   Nothing has changed. Still the discharge , but no odor, or other complications. I think I'll start some probiotics today.   Thank you,  Anita Schmidt  [id number]   [phone]

## 2016-10-08 NOTE — Telephone Encounter (Signed)
Spoke with patient, advised as seen below per Dr. Quincy Simmonds. Rx for Flagyl to verified pharmacy on file. Advised to avoid alcohol during treatment and 24 hours after completing medication. Don't mix with alcohol if mixed can cause severe nausea, vomiting and abdominal cramping. Normal side effects of Flagyl may include metallic taste in mouth, slight nausea, headache, abdominal cramping, diarrhea and dizziness. Patient verbalizes understanding and is agreeable.  Routing to provider for final review. Patient is agreeable to disposition. Will close encounter.

## 2016-10-08 NOTE — Telephone Encounter (Signed)
See telephone encounter dated 10/08/16.

## 2016-10-08 NOTE — Telephone Encounter (Signed)
Ok for Flagyl 500 mg po bid for 7 days.  ETOH precautions.

## 2016-10-14 ENCOUNTER — Encounter: Payer: Self-pay | Admitting: Obstetrics and Gynecology

## 2016-10-18 NOTE — Progress Notes (Signed)
[facility] [address] [place], [place] [address] Phone: [phone] Subjective:    I'm seeing this patient by the request  of:  Cathlean Cower, MD  CC: knee pain Right, right shoulder pain  RDE:[id number]  Anita Schmidt is a 61 y.o. female coming in with complaint of knee pain, right. Patient states that his been going on for multiple months. Patient has noticed that when she tries to hold certain positioning in yoga she has a discomfort. Patient denies any radiation of pain. Mild swelling noted on the anterior aspect of the knee. Patient states that it does not lock or give out on her. Patient denies any nighttime pain. Does not remember any specific injury. Does not take any over-the-counter medications.  Right shoulder pain as well. Mild discomfort. Notices that she cannot lift her arm all the way compared to the contralateral side. No radiation of pain or any numbness or tingling. Patient denies any injury. States that feels like she has to rotate her right arm interiorly to make her arm above the fold distance.    Past Medical History:  Diagnosis Date  . Anxiety    severe  . Anxiety disorder   . Fibrocystic breast disease   . Gilbert's syndrome   . Hemochromatosis   . Hyperlipidemia   . Skin cancer, basal cell    nose   Past Surgical History:  Procedure Laterality Date  . BREAST BIOPSY    . colonscopy  2011   Tics ; Dr Sharlett Iles  . DILATION AND CURETTAGE OF UTERUS    . INGUINAL HERNIA REPAIR    . MOHS SURGERY  2012   nose  . TONSILLECTOMY    . Tracer Pellets  2003   in breast; [place]   Social History   Social History  . Marital status: Single    Spouse name: N/A  . Number of children: N/A  . Years of education: N/A   Occupational History  . 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 Smoking status: Never Smoker  . Smokeless tobacco: Never Used  . Alcohol use 6.0 oz/week    10 Standard drinks or equivalent per week    Comment: drinks 10--4oz drinks/week  . Drug use: No  . Sexual activity: No   Other Topics Concern  . None   Social History Narrative   GETS REGULAR EXERCISE         Allergies  Allergen Reactions  . Latex Itching    Redness & itching Because of a history of documented adverse serious drug reaction;Medi Alert bracelet  is recommended  . Epinephrine     ? jittery  . Effexor [Venlafaxine]     Shaking, elevated BP,full blown panic attack  Hypertension with systolic blood pressure 314 with severe nausea  . Amoxicillin     REACTION: GI UPSET  . Citalopram     ? nausea   Family History  Problem Relation Age of Onset  . Hypertension Mother     PMH  . Alcohol abuse Mother   . Parkinson's disease Mother   . Anxiety disorder Brother   . Alzheimer's disease Maternal Grandmother   . Hypertension Maternal Grandmother   . Stroke Paternal Uncle   . Alzheimer's disease Paternal Uncle   . Depression Brother   . Diabetes Neg Hx     Past medical history, social, surgical and family history all reviewed in electronic medical record.  No pertanent information unless stated  regarding to the chief complaint.   Review of Systems:Review of systems updated and as accurate as of 10/19/16  No headache, visual changes, nausea, vomiting, diarrhea, constipation, dizziness, abdominal pain, skin rash, fevers, chills, night sweats, weight loss, swollen lymph nodes, body aches, joint swelling, muscle aches, chest pain, shortness of breath, mood changes.   Objective  Blood pressure 130/88, pulse 72, resp. rate 16, weight 134 lb (60.8 kg), last menstrual period 07/19/2008, SpO2 98 %. Systems examined below as of 10/19/16   General: No apparent distress alert and oriented x3 mood and affect normal, dressed appropriately.  HEENT: Pupils equal, extraocular movements intact  Respiratory: Patient's speak in full sentences and does not appear short of breath  Cardiovascular: No lower extremity edema, non  tender, no erythema  Skin: Warm dry intact with no signs of infection or rash on extremities or on axial skeleton.  Abdomen: Soft nontender  Neuro: Cranial nerves II through XII are intact, neurovascularly intact in all extremities with 2+ DTRs and 2+ pulses.  Lymph: No lymphadenopathy of posterior or anterior cervical chain or axillae bilaterally.  Gait normal with good balance and coordination.  MSK:  Non tender with full range of motion and good stability and symmetric strength and tone of  elbows, wrist, hip, and ankles bilaterally.  Shoulder: Right Inspection reveals no abnormalities, atrophy or asymmetry. Palpation is normal with no tenderness over AC joint or bicipital groove. ROM is full in all planes. Rotator cuff strength normal throughout. No signs of impingement with negative Neer and Hawkin's tests, empty can sign. Speeds and Yergason's tests normal. No labral pathology noted with negative Obrien's, negative clunk and good stability. Patient does have some inferior scapular dyskinesia. No painful arc and no drop arm sign. No apprehension sign R shoulder unremarkable    Knee:right  Normal to inspection with no erythema or effusion or obvious bony abnormalities. Palpation normal with no warmth, joint line tenderness, patellar tenderness, or condyle tenderness. ROM full in flexion and extension and lower leg rotation. Ligaments with solid consistent endpoints including ACL, PCL, LCL, MCL. Negative Mcmurray's, Apley's, and Thessalonian tests. Non painful patellar compression. Patellar glide without crepitus. Patellar and quadriceps tendons unremarkable. Hamstring and quadriceps strength is normal.   MSK US performed of: Right knee This study was ordered, performed, and interpreted by Charlann Boxer D.O.  Knee: All structures visualized. Anteromedial, anterolateral, posteromedial, and posterolateral menisci unremarkable without tearing, fraying, effusion, or  displacement. Patellar Tendon unremarkable on long and transverse views without effusion. Patient does have swelling of the pes anserine bursa and does have increasing hypoechoic changes around the distal hamstring tendon. No true tear appreciated. No abnormality of prepatellar bursa. LCL and MCL unremarkable on long and transverse views. No abnormality of origin of medial or lateral head of the gastrocnemius.  IMPRESSION:  Pes anserine bursitis  Procedure note 35465; 15 minutes spent for Therapeutic exercises as stated in above notes.  This included exercises focusing on stretching, strengthening, with significant focus on eccentric aspects. Shoulder Exercises that included:  Basic scapular stabilization to include adduction and depression of scapula Scaption, focusing on proper movement and good control Internal and External rotation utilizing a theraband, with elbow tucked at side entire time Rows with theraband    Proper technique shown and discussed handout in great detail with ATC.  All questions were discussed and answered.     Impression and Recommendations:     This case required medical decision making of moderate complexity.  Note: This dictation was prepared with Dragon dictation along with smaller phrase technology. Any transcriptional errors that result from this process are unintentional.

## 2016-10-19 ENCOUNTER — Ambulatory Visit (INDEPENDENT_AMBULATORY_CARE_PROVIDER_SITE_OTHER): Payer: 59 | Admitting: Family Medicine

## 2016-10-19 ENCOUNTER — Ambulatory Visit: Payer: Self-pay

## 2016-10-19 ENCOUNTER — Encounter: Payer: Self-pay | Admitting: Family Medicine

## 2016-10-19 VITALS — BP 130/88 | HR 72 | Resp 16 | Wt 134.0 lb

## 2016-10-19 DIAGNOSIS — M25569 Pain in unspecified knee: Secondary | ICD-10-CM

## 2016-10-19 DIAGNOSIS — G2589 Other specified extrapyramidal and movement disorders: Secondary | ICD-10-CM | POA: Diagnosis not present

## 2016-10-19 DIAGNOSIS — M7050 Other bursitis of knee, unspecified knee: Secondary | ICD-10-CM | POA: Insufficient documentation

## 2016-10-19 NOTE — Assessment & Plan Note (Signed)
Patient is an more of a pes anserine bursitis. Patient will try over-the-counter topical and medications, icing protocol. We discussed thigh compression sleeve to change the fulcrum. Patient will avoid certain activities. Patient come back and see me again in 4-6 weeks for further evaluation.

## 2016-10-19 NOTE — Patient Instructions (Signed)
Good to see you.  Ice 20 minutes 2 times daily. Usually after activity and before bed. Exercises 3 times a week.  Try to add in a little cardio and strength training in your regimen.  Thigh compression sleeve can change fulrum on how you pull on your hamstrings.  Vitamin D 2000 IU daily  Consider adding some turmeric in your diet to decrease inflammation  See me again in 4-6 weeks.

## 2016-10-19 NOTE — Assessment & Plan Note (Signed)
Mild to moderate overall. Patient given home exercises, discussed posture and ergonomics, we discussed which activities to do a which ones to avoid. Follow-up again in 4-6 weeks. Could be a candidate for manipulation if needed.

## 2016-10-19 NOTE — Progress Notes (Signed)
Pre-visit discussion using our clinic review tool. No additional management support is needed unless otherwise documented below in the visit note., 

## 2016-10-20 ENCOUNTER — Encounter: Payer: Self-pay | Admitting: Family Medicine

## 2016-11-30 ENCOUNTER — Ambulatory Visit: Payer: Self-pay | Admitting: Internal Medicine

## 2016-11-30 ENCOUNTER — Ambulatory Visit: Payer: Self-pay | Admitting: Family Medicine

## 2016-12-07 ENCOUNTER — Ambulatory Visit: Payer: Self-pay | Admitting: Family Medicine

## 2016-12-20 ENCOUNTER — Encounter: Payer: Self-pay | Admitting: Obstetrics and Gynecology

## 2016-12-20 ENCOUNTER — Ambulatory Visit (INDEPENDENT_AMBULATORY_CARE_PROVIDER_SITE_OTHER): Payer: 59 | Admitting: Obstetrics and Gynecology

## 2016-12-20 VITALS — Resp 16 | Ht 66.0 in | Wt 131.0 lb

## 2016-12-20 DIAGNOSIS — R011 Cardiac murmur, unspecified: Secondary | ICD-10-CM | POA: Diagnosis not present

## 2016-12-20 DIAGNOSIS — Z01419 Encounter for gynecological examination (general) (routine) without abnormal findings: Secondary | ICD-10-CM

## 2016-12-20 DIAGNOSIS — R040 Epistaxis: Secondary | ICD-10-CM | POA: Diagnosis not present

## 2016-12-20 NOTE — Patient Instructions (Signed)

## 2016-12-20 NOTE — Progress Notes (Signed)
61 y.o. [id number] Single Caucasian female here for annual exam.    BV is now gone.  Is really happy with this.  Hylafem and oral lemon juice worked well for her.   No vaginal bleeding.  No bladder or bowel problems.  Nose bleed last night.  Had a few this spring on the right.  Has had them since childhood. States she lost her sense of smell.   Some fatigue with exercise.  Hx of low ferritin.   Feels tired.  Mother has Parkinsons and lives in a 2 room apartment.  Receives meals daily.  Patient providing a lot of her care on a daily basis.  Father is 32 and skiis and does bicycle riding.   Labs with PCP.   PCP:  Dr. Cathlean Cower - [place]    Patient's last menstrual period was 07/19/2008.           Sexually active: No.  The current method of family planning is post menopausal status.    Exercising: Yes.    yoga and walking Smoker:  no  Health Maintenance: Pap: 12-18-15 Neg:Neg HR HPV          11-23-12 Neg:Neg HR HPV History of abnormal Pap:  no MMG: 03-08-16 Density B/NegBiRads1:TBC Colonoscopy: 03/16/10 -- normal -- in EPIC BMD:   2012  Result:Normal with Dr. Sharlett Iles.  Diverticulosis. TDaP:  PCP in 2013 Gardasil:   no HIV: never. Hep C: never. Screening Labs:  Hb today: PCP takes care of all labs   reports that she has never smoked. She has never used smokeless tobacco. She reports that she drinks about 6.0 oz of alcohol per week . She reports that she does not use drugs.  Past Medical History:  Diagnosis Date  . Anxiety    severe  . Anxiety disorder   . Fibrocystic breast disease   . Gilbert's syndrome   . Hemochromatosis   . Hyperlipidemia   . Skin cancer, basal cell    nose    Past Surgical History:  Procedure Laterality Date  . BREAST BIOPSY    . colonscopy  2011   Tics ; Dr Sharlett Iles  . DILATION AND CURETTAGE OF UTERUS    . INGUINAL HERNIA REPAIR    . MOHS SURGERY  2012   nose  . TONSILLECTOMY    . Tracer Pellets  2003   in breast; [facility]    No  current outpatient prescriptions on file.   No current facility-administered medications for this visit.     Family History  Problem Relation Age of Onset  . Hypertension Mother        PMH  . Alcohol abuse Mother   . Parkinson's disease Mother   . Anxiety disorder Brother   . Depression Brother   . Alzheimer's disease Maternal Grandmother   . Hypertension Maternal Grandmother   . Stroke Paternal Uncle   . Alzheimer's disease Paternal Uncle   . Diabetes Neg Hx     ROS:  Pertinent items are noted in HPI.  Otherwise, a comprehensive ROS was negative.  Exam:   Resp 16   Ht 5' 6"  (1.676 m)   Wt 131 lb (59.4 kg)   LMP 07/19/2008   BMI 21.14 kg/m     General appearance: alert, cooperative and appears stated age Head: Normocephalic, without obvious abnormality, atraumatic Neck: no adenopathy, supple, symmetrical, trachea midline and thyroid normal to inspection and palpation Lungs: clear to auscultation bilaterally Breasts: normal appearance, no masses or tenderness, No nipple  retraction or dimpling, No nipple discharge or bleeding, No axillary or supraclavicular adenopathy Heart: II/VI systolic murmur, regular rate and rhythm Abdomen: soft, non-tender; no masses, no organomegaly Extremities: extremities normal, atraumatic, no cyanosis or edema Skin: Skin color, texture, turgor normal. No rashes or lesions Lymph nodes: Cervical, supraclavicular, and axillary nodes normal. No abnormal inguinal nodes palpated Neurologic: Grossly normal  Pelvic: External genitalia:  no lesions              Urethra:  normal appearing urethra with no masses, tenderness or lesions              Bartholins and Skenes: normal                 Vagina: normal appearing vagina with normal color and discharge, no lesions              Cervix: no lesions              Pap taken:  No. Bimanual Exam:  Uterus:  normal size, contour, position, consistency, mobility, non-tender              Adnexa: no mass,  fullness, tenderness              Rectal exam: Yes.  .  Confirms.              Anus:  normal sphincter tone, no lesions  Chaperone was present for exam.  Assessment:   Well woman visit with normal exam. Epistaxis. Cardiac murmur.  Plan: Mammogram screening discussed. Recommended self breast awareness. Pap and HR HPV as above. Guidelines for Calcium, Vitamin D, regular exercise program including cardiovascular and weight bearing exercise. Referral to ENT.  Referral to PCP for further cardiac evaluation.  Follow up annually and prn.   After visit summary provided.

## 2016-12-29 ENCOUNTER — Encounter: Payer: Self-pay | Admitting: Internal Medicine

## 2016-12-29 ENCOUNTER — Ambulatory Visit (INDEPENDENT_AMBULATORY_CARE_PROVIDER_SITE_OTHER): Payer: 59 | Admitting: Internal Medicine

## 2016-12-29 VITALS — BP 158/92 | HR 65 | Ht 66.0 in | Wt 130.0 lb

## 2016-12-29 DIAGNOSIS — R011 Cardiac murmur, unspecified: Secondary | ICD-10-CM | POA: Insufficient documentation

## 2016-12-29 DIAGNOSIS — R030 Elevated blood-pressure reading, without diagnosis of hypertension: Secondary | ICD-10-CM

## 2016-12-29 DIAGNOSIS — F411 Generalized anxiety disorder: Secondary | ICD-10-CM | POA: Diagnosis not present

## 2016-12-29 NOTE — Patient Instructions (Signed)
Your EKG was OK  Please continue all other medications as before, and refills have been done if requested.  Please have the pharmacy call with any other refills you may need.  Please keep your appointments with your specialists as you may have planned

## 2016-12-29 NOTE — Assessment & Plan Note (Signed)
With mild situational increased, declines need for counseling or psychiatric referral

## 2016-12-29 NOTE — Assessment & Plan Note (Signed)
[place] stable, .cont to monitor BP at home and next visit

## 2016-12-29 NOTE — Progress Notes (Signed)
Subjective:    Patient ID: Anita Schmidt, female    DOB: 1955-07-20, 61 y.o.   MRN: [id number]  HPI   Here to f/u at GYN request regarding ? Of worsening heart murmur. Pt has long hx of murmur, last noted by her recollection per Dr Linna Darner in 2012, ECG at that time benign.  Tolerated whole class of flow yoga as recently as last PM.  Pt denies chest pain, increased sob or doe, wheezing, orthopnea, PND, increased LE swelling, palpitations, dizziness or syncope, and has remained without symtpoms for years.  No echo orevious done.  Pt is considering retiring as her position is stressful, and may go bare for insurance for a few years, so wants to minimize costs now as well.  Has long hx of elev BP at offices - BP at home usually 120/80.  Denies worsening depressive symptoms, suicidal ideation, or panic; has ongoing anxiety.   Past Medical History:  Diagnosis Date  . Anxiety    severe  . Anxiety disorder   . Fibrocystic breast disease   . Gilbert's syndrome   . Hemochromatosis   . Hyperlipidemia   . Skin cancer, basal cell    nose   Past Surgical History:  Procedure Laterality Date  . BREAST BIOPSY    . colonscopy  2011   Tics ; Dr Sharlett Iles  . DILATION AND CURETTAGE OF UTERUS    . INGUINAL HERNIA REPAIR    . MOHS SURGERY  2012   nose  . TONSILLECTOMY    . Tracer Pellets  2003   in breast; [place]    reports that she has never smoked. She has never used smokeless tobacco. She reports that she drinks about 6.0 oz of alcohol per week . She reports that she does not use drugs. family history includes Alcohol abuse in her mother; Alzheimer's disease in her maternal grandmother and paternal uncle; Anxiety disorder in her brother; Depression in her brother; Hypertension in her maternal grandmother and mother; Parkinson's disease in her mother; Stroke in her paternal uncle. Allergies  Allergen Reactions  . Latex Itching    Redness & itching Because of a history of documented adverse serious  drug reaction;Medi Alert bracelet  is recommended  . Epinephrine     ? jittery  . Effexor [Venlafaxine]     Shaking, elevated BP,full blown panic attack  Hypertension with systolic blood pressure 759 with severe nausea  . Amoxicillin     REACTION: GI UPSET  . Citalopram     ? nausea   No current outpatient prescriptions on file prior to visit.   No current facility-administered medications on file prior to visit.      Review of Systems All otherwise neg per pt    Objective:   Physical Exam BP (!) 158/92   Pulse 65   Ht 5' 6"  (1.676 m)   Wt 130 lb (59 kg)   LMP 07/19/2008   SpO2 98%   BMI 20.98 kg/m  VS noted,  Constitutional: Pt appears in NAD HENT: Head: NCAT.  Right Ear: External ear normal.  Left Ear: External ear normal.  Eyes: . Pupils are equal, round, and reactive to light. Conjunctivae and EOM are normal Nose: without d/c or deformity Neck: Neck supple. Gross normal ROM Cardiovascular: Normal rate and regular rhythm.  , has faint gr 1-2 sys murmur LUSB without radiation Pulmonary/Chest: Effort normal and breath sounds without rales or wheezing.  Neurological: Pt is alert. At baseline orientation, motor grossly intact  Skin: Skin is warm. No rashes, other new lesions, no LE edema Psychiatric: Pt behavior is normal without agitation , 1-2+ nervous No other exam findings  ECG today I have personally interpreted NSR with no evidence for LVH, has nonspecific T wave abnormality     Assessment & Plan:

## 2016-12-29 NOTE — Assessment & Plan Note (Signed)
ecg reviewed, pt has mild abnormal exam, but asympt, and declines further evaluation such as echo

## 2016-12-31 ENCOUNTER — Ambulatory Visit: Payer: Self-pay | Admitting: Family Medicine

## 2017-02-14 ENCOUNTER — Encounter: Payer: Self-pay | Admitting: Internal Medicine

## 2017-04-07 ENCOUNTER — Encounter: Payer: Self-pay | Admitting: Internal Medicine

## 2017-04-07 ENCOUNTER — Other Ambulatory Visit (INDEPENDENT_AMBULATORY_CARE_PROVIDER_SITE_OTHER): Payer: 59

## 2017-04-07 ENCOUNTER — Ambulatory Visit (INDEPENDENT_AMBULATORY_CARE_PROVIDER_SITE_OTHER): Payer: 59 | Admitting: Internal Medicine

## 2017-04-07 VITALS — BP 156/90 | HR 65 | Temp 98.2°F | Ht 66.0 in | Wt 128.0 lb

## 2017-04-07 DIAGNOSIS — R739 Hyperglycemia, unspecified: Secondary | ICD-10-CM

## 2017-04-07 DIAGNOSIS — W57XXXA Bitten or stung by nonvenomous insect and other nonvenomous arthropods, initial encounter: Secondary | ICD-10-CM

## 2017-04-07 DIAGNOSIS — Z0000 Encounter for general adult medical examination without abnormal findings: Secondary | ICD-10-CM | POA: Diagnosis not present

## 2017-04-07 LAB — CBC WITH DIFFERENTIAL/PLATELET
Basophils Absolute: 0 10*3/uL (ref 0.0–0.1)
Basophils Relative: 0.6 % (ref 0.0–3.0)
EOS PCT: 0.7 % (ref 0.0–5.0)
Eosinophils Absolute: 0 10*3/uL (ref 0.0–0.7)
HEMATOCRIT: 43.5 % (ref 36.0–46.0)
HEMOGLOBIN: 14.7 g/dL (ref 12.0–15.0)
LYMPHS PCT: 36.5 % (ref 12.0–46.0)
Lymphs Abs: 2.1 10*3/uL (ref 0.7–4.0)
MCHC: 33.8 g/dL (ref 30.0–36.0)
MCV: 94 fl (ref 78.0–100.0)
MONO ABS: 0.5 10*3/uL (ref 0.1–1.0)
Monocytes Relative: 8.6 % (ref 3.0–12.0)
Neutro Abs: 3.1 10*3/uL (ref 1.4–7.7)
Neutrophils Relative %: 53.6 % (ref 43.0–77.0)
Platelets: 268 10*3/uL (ref 150.0–400.0)
RBC: 4.63 Mil/uL (ref 3.87–5.11)
RDW: 12.3 % (ref 11.5–15.5)
WBC: 5.8 10*3/uL (ref 4.0–10.5)

## 2017-04-07 LAB — HEMOGLOBIN A1C: Hgb A1c MFr Bld: 5.7 % (ref 4.6–6.5)

## 2017-04-07 NOTE — Patient Instructions (Signed)

## 2017-04-07 NOTE — Progress Notes (Signed)
Subjective:    Patient ID: Anita Schmidt, female    DOB: 10-Jan-1956, 61 y.o.   MRN: [id number]  HPI  Here for wellness and f/u;  Overall doing ok;  Pt denies Chest pain, worsening SOB, DOE, wheezing, orthopnea, PND, worsening LE edema, palpitations, dizziness or syncope.  Pt denies neurological change such as new headache, facial or extremity weakness.  Pt denies polydipsia, polyuria, or low sugar symptoms. Pt states overall good compliance with treatment and medications, good tolerability, and has been trying to follow appropriate diet.  Pt denies worsening depressive symptoms, suicidal ideation or panic. No fever, night sweats, wt loss, loss of appetite, or other constitutional symptoms.  Pt states good ability with ADL's, has low fall risk, home safety reviewed and adequate, no other significant changes in hearing or vision, and only occasionally active with exercise.  Does have what seems to be an insect bite to right foot instep but no fever, swelling, redness.   Past Medical History:  Diagnosis Date  . Anxiety    severe  . Anxiety disorder   . Fibrocystic breast disease   . Gilbert's syndrome   . Hemochromatosis   . Hyperlipidemia   . Skin cancer, basal cell    nose   Past Surgical History:  Procedure Laterality Date  . BREAST BIOPSY    . colonscopy  2011   Tics ; Dr Sharlett Iles  . DILATION AND CURETTAGE OF UTERUS    . INGUINAL HERNIA REPAIR    . MOHS SURGERY  2012   nose  . TONSILLECTOMY    . Tracer Pellets  2003   in breast; [place]    reports that she has never smoked. She has never used smokeless tobacco. She reports that she drinks about 6.0 oz of alcohol per week . She reports that she does not use drugs. family history includes Alcohol abuse in her mother; Alzheimer's disease in her maternal grandmother and paternal uncle; Anxiety disorder in her brother; Depression in her brother; Hypertension in her maternal grandmother and mother; Parkinson's disease in her mother;  Stroke in her paternal uncle. Allergies  Allergen Reactions  . Latex Itching    Redness & itching Because of a history of documented adverse serious drug reaction;Medi Alert bracelet  is recommended  . Epinephrine     ? jittery  . Effexor [Venlafaxine]     Shaking, elevated BP,full blown panic attack  Hypertension with systolic blood pressure 409 with severe nausea  . Amoxicillin     REACTION: GI UPSET  . Citalopram     ? nausea   No current outpatient prescriptions on file prior to visit.   No current facility-administered medications on file prior to visit.    Review of Systems Constitutional: Negative for other unusual diaphoresis, sweats, appetite or weight changes HENT: Negative for other worsening hearing loss, ear pain, facial swelling, mouth sores or neck stiffness.   Eyes: Negative for other worsening pain, redness or other visual disturbance.  Respiratory: Negative for other stridor or swelling Cardiovascular: Negative for other palpitations or other chest pain  Gastrointestinal: Negative for worsening diarrhea or loose stools, blood in stool, distention or other pain Genitourinary: Negative for hematuria, flank pain or other change in urine volume.  Musculoskeletal: Negative for myalgias or other joint swelling.  Skin: Negative for other color change, or other wound or worsening drainage.  Neurological: Negative for other syncope or numbness. Hematological: Negative for other adenopathy or swelling Psychiatric/Behavioral: Negative for hallucinations, other worsening agitation, SI,  self-injury, or new decreased concentration All other ystem neg per pt    Objective:   Physical Exam BP (!) 156/90   Pulse 65   Temp 98.2 F (36.8 C) (Oral)   Ht 5\' 6"  (1.676 m)   Wt 128 lb (58.1 kg)   LMP 07/19/2008   SpO2 100%   BMI 20.66 kg/m  VS noted,  Constitutional: Pt is oriented to person, place, and time. Appears well-developed and well-nourished, in no significant  distress and comfortable Head: Normocephalic and atraumatic  Eyes: Conjunctivae and EOM are normal. Pupils are equal, round, and reactive to light Right Ear: External ear normal without discharge Left Ear: External ear normal without discharge Nose: Nose without discharge or deformity Mouth/Throat: Oropharynx is without other ulcerations and moist  Neck: Normal range of motion. Neck supple. No JVD present. No tracheal deviation present or significant neck LA or mass Cardiovascular: Normal rate, regular rhythm, normal heart sounds and intact distal pulses.   Pulmonary/Chest: WOB normal and breath sounds without rales or wheezing  Abdominal: Soft. Bowel sounds are normal. NT. No HSM  Musculoskeletal: Normal range of motion. Exhibits no edema Lymphadenopathy: Has no other cervical adenopathy.  Neurological: Pt is alert and oriented to person, place, and time. Pt has normal reflexes. No cranial nerve deficit. Motor grossly intact, Gait intact Skin: Skin is warm and dry. No rash noted or new ulcerations, does have small < 5 mm insect bite like lesion to right instep without redness, swelling, tenderness or rash Psychiatric:  Has normal mood and affect. Behavior is normal without agitation No other exam findings    Assessment & Plan:

## 2017-04-08 ENCOUNTER — Encounter: Payer: Self-pay | Admitting: Internal Medicine

## 2017-04-08 LAB — HEPATIC FUNCTION PANEL
ALBUMIN: 4.6 g/dL (ref 3.5–5.2)
ALT: 11 U/L (ref 0–35)
AST: 15 U/L (ref 0–37)
Alkaline Phosphatase: 49 U/L (ref 39–117)
Bilirubin, Direct: 0.1 mg/dL (ref 0.0–0.3)
Total Bilirubin: 0.7 mg/dL (ref 0.2–1.2)
Total Protein: 6.9 g/dL (ref 6.0–8.3)

## 2017-04-08 LAB — URINALYSIS, ROUTINE W REFLEX MICROSCOPIC
Bilirubin Urine: NEGATIVE
Ketones, ur: NEGATIVE
Nitrite: NEGATIVE
SPECIFIC GRAVITY, URINE: 1.01 (ref 1.000–1.030)
Total Protein, Urine: NEGATIVE
URINE GLUCOSE: NEGATIVE
UROBILINOGEN UA: 0.2 (ref 0.0–1.0)
pH: 7 (ref 5.0–8.0)

## 2017-04-08 LAB — LIPID PANEL
CHOL/HDL RATIO: 3
CHOLESTEROL: 172 mg/dL (ref 0–200)
HDL: 64.7 mg/dL (ref >39.00)
LDL CALC: 96 mg/dL (ref 0–99)
NonHDL: 106.98
TRIGLYCERIDES: 56 mg/dL (ref 0.0–149.0)
VLDL: 11.2 mg/dL (ref 0.0–40.0)

## 2017-04-08 LAB — BASIC METABOLIC PANEL
BUN: 14 mg/dL (ref 6–23)
CALCIUM: 9.7 mg/dL (ref 8.4–10.5)
CO2: 23 mEq/L (ref 19–32)
CREATININE: 0.75 mg/dL (ref 0.40–1.20)
Chloride: 106 mEq/L (ref 96–112)
GFR: 82.97 mL/min (ref >60.00)
GLUCOSE: 97 mg/dL (ref 70–99)
POTASSIUM: 3.9 meq/L (ref 3.5–5.1)
Sodium: 141 mEq/L (ref 135–145)

## 2017-04-08 LAB — TSH: TSH: 3.28 u[IU]/mL (ref 0.35–4.50)

## 2017-04-09 NOTE — Assessment & Plan Note (Signed)
Benign appearing bite site right instep, no evidence for cellulitis,  to f/u any worsening symptoms or concerns

## 2017-04-09 NOTE — Assessment & Plan Note (Signed)
Lab Results  Component Value Date   HGBA1C 5.7 04/07/2017  stable overall by history and exam, recent data reviewed with pt, and pt to continue medical treatment as before,  to f/u any worsening symptoms or concerns

## 2017-04-09 NOTE — Assessment & Plan Note (Signed)

## 2017-04-11 NOTE — Addendum Note (Signed)
Addended by: Biagio Borg on: 04/11/2017 03:15 PM   Modules accepted: Orders

## 2017-05-13 ENCOUNTER — Other Ambulatory Visit (INDEPENDENT_AMBULATORY_CARE_PROVIDER_SITE_OTHER): Payer: 59

## 2017-05-13 LAB — FERRITIN: Ferritin: 253 ng/mL (ref 10.0–291.0)

## 2017-05-13 LAB — IBC PANEL
Iron: 130 ug/dL (ref 42–145)
Saturation Ratios: 47.9 % (ref 20.0–50.0)
TRANSFERRIN: 194 mg/dL — AB (ref 212.0–360.0)

## 2017-05-16 ENCOUNTER — Encounter: Payer: Self-pay | Admitting: Gastroenterology

## 2017-06-13 ENCOUNTER — Other Ambulatory Visit: Payer: Self-pay

## 2017-06-13 DIAGNOSIS — K760 Fatty (change of) liver, not elsewhere classified: Secondary | ICD-10-CM

## 2017-06-23 ENCOUNTER — Emergency Department (HOSPITAL_BASED_OUTPATIENT_CLINIC_OR_DEPARTMENT_OTHER)
Admission: EM | Admit: 2017-06-23 | Discharge: 2017-06-23 | Disposition: A | Discharge status: Home / Self Care | Payer: 59 | Attending: Emergency Medicine | Admitting: Emergency Medicine

## 2017-06-23 DIAGNOSIS — R040 Epistaxis: Secondary | ICD-10-CM | POA: Insufficient documentation

## 2017-06-23 DIAGNOSIS — F419 Anxiety disorder, unspecified: Secondary | ICD-10-CM | POA: Insufficient documentation

## 2017-06-23 DIAGNOSIS — Z5321 Procedure and treatment not carried out due to patient leaving prior to being seen by health care provider: Secondary | ICD-10-CM | POA: Diagnosis not present

## 2017-06-23 NOTE — ED Triage Notes (Addendum)
Pt states she does not want to be seen-nose bleed has stopped-states nose bleed started approx 11am after sneezing-states she felt anxious and came to ED-pt states she has freq nose bleeds and was seen by ENT 2 days ago-pt advised to call ENT for further advice and return prn-pt pleasant/agreed-NAD-steady gait

## 2017-06-24 ENCOUNTER — Other Ambulatory Visit (INDEPENDENT_AMBULATORY_CARE_PROVIDER_SITE_OTHER): Payer: 59

## 2017-06-24 DIAGNOSIS — K760 Fatty (change of) liver, not elsewhere classified: Secondary | ICD-10-CM | POA: Diagnosis not present

## 2017-06-24 LAB — HEPATIC FUNCTION PANEL
ALBUMIN: 4.3 g/dL (ref 3.5–5.2)
ALK PHOS: 54 U/L (ref 39–117)
ALT: 10 U/L (ref 0–35)
AST: 15 U/L (ref 0–37)
Bilirubin, Direct: 0.1 mg/dL (ref 0.0–0.3)
TOTAL PROTEIN: 6.3 g/dL (ref 6.0–8.3)
Total Bilirubin: 0.8 mg/dL (ref 0.2–1.2)

## 2017-07-18 ENCOUNTER — Ambulatory Visit (INDEPENDENT_AMBULATORY_CARE_PROVIDER_SITE_OTHER): Payer: 59 | Admitting: Gastroenterology

## 2017-07-18 ENCOUNTER — Other Ambulatory Visit (INDEPENDENT_AMBULATORY_CARE_PROVIDER_SITE_OTHER): Payer: 59

## 2017-07-18 ENCOUNTER — Ambulatory Visit (INDEPENDENT_AMBULATORY_CARE_PROVIDER_SITE_OTHER)
Admission: RE | Admit: 2017-07-18 | Discharge: 2017-07-18 | Disposition: A | Discharge status: Home / Self Care | Payer: 59 | Source: Ambulatory Visit | Attending: Gastroenterology | Admitting: Gastroenterology

## 2017-07-18 ENCOUNTER — Encounter: Payer: Self-pay | Admitting: Gastroenterology

## 2017-07-18 DIAGNOSIS — Z8639 Personal history of other endocrine, nutritional and metabolic disease: Secondary | ICD-10-CM

## 2017-07-18 DIAGNOSIS — Z13820 Encounter for screening for osteoporosis: Secondary | ICD-10-CM | POA: Diagnosis not present

## 2017-07-18 LAB — VITAMIN D 25 HYDROXY (VIT D DEFICIENCY, FRACTURES): VITD: 39.22 ng/mL (ref 30.00–100.00)

## 2017-07-18 NOTE — Patient Instructions (Addendum)
If you are age 63 or older, your body mass index should be between 23-30. Your Body mass index is 20.82 kg/m. If this is out of the aforementioned range listed, please consider follow up with your Primary Care Provider.  If you are age 58 or younger, your body mass index should be between 19-25. Your Body mass index is 20.82 kg/m. If this is out of the aformentioned range listed, please consider follow up with your Primary Care Provider.   Please go to Xray Dept in the basement of our building at this time to have a Dexa Scan (Bone Density).  Please have lab work done as you leave today as well.   Thank you for entrusting me with your care and for [facility], Dr.  Cellar

## 2017-07-18 NOTE — Progress Notes (Addendum)
HPI :  61 y/o female with history of hereditary hemochromatosis, here for a follow up visit. 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 Historically her ferritin levels have been < 500 and ALT level has been normal.   She was diagnosed about 9 years ago. She reported phlebotomy one time remotely but none since then. No history of DM, thyroid abnormalities, or skin issues. She denies any FH of cirrhosis or liver disease. She does not have any children. She previously drank a bottle of wine per night for 3-5 years, but for the past 10 years drinks only one glass of wine per week. She denies any vitamin C supplements or high intake of vitamin C.  Since her last visit she had an [place] 06/21/2016 showing fatty infiltration of the liver without cirrhotic changes, otherwise no pathology  ALT on 06/24/17 was 10  05/13/17: Iron level of 130 Iron sat 47.9 % Ferritin of 253 (rose from prior values of 180-190s)  EKG 12/29/2016 - normal  She feels well without complaints. She has started a vitamin D supplement, has questions about osteoporosis screening, as she has not had any. She is post-menopausal. No jaundice, no LE edema, no abdominal pains.   Colonoscopy 03/16/2010 - severe diverticulosis    Past Medical History:  Diagnosis Date  . Anxiety    severe  . Anxiety disorder   . Fibrocystic breast disease   . Gilbert's syndrome   . Hemochromatosis   . Hyperlipidemia   . Skin cancer, basal cell    nose     Past Surgical History:  Procedure Laterality Date  . BREAST BIOPSY    . colonscopy  2011   Tics ; Dr Sharlett Iles  . DILATION AND CURETTAGE OF UTERUS    . INGUINAL HERNIA REPAIR    . MOHS SURGERY  2012   nose  . TONSILLECTOMY    . Tracer Pellets  2003   in breast; [place]   Family History  Problem Relation Age of Onset  . Hypertension Mother        PMH  . Alcohol abuse Mother   . Parkinson's disease Mother   . Anxiety disorder Brother   . Depression Brother   .  Alzheimer's disease Maternal Grandmother   . Hypertension Maternal Grandmother   . Stroke Paternal Uncle   . Alzheimer's disease Paternal Uncle   . Diabetes Neg Hx    Social History   Tobacco Use  . Smoking status: Never Smoker  . Smokeless tobacco: Never Used  Substance Use Topics  . Alcohol use: Yes    Alcohol/week: 6.0 oz    Types: 10 Standard drinks or equivalent per week    Comment: drinks 10--4oz drinks/week  . Drug use: No   Current Outpatient Medications  Medication Sig Dispense Refill  . SALINE NASAL SPRAY NA Place 4 sprays into both nostrils 4 (four) times daily.     No current facility-administered medications for this visit.    Allergies  Allergen Reactions  . Latex Itching    Redness & itching Because of a history of documented adverse serious drug reaction;Medi Alert bracelet  is recommended  . Epinephrine     ? jittery  . Effexor [Venlafaxine]     Shaking, elevated BP,full blown panic attack  Hypertension with systolic blood pressure 194 with severe nausea  . Amoxicillin     REACTION: GI UPSET  . Citalopram     ? nausea     Review of Systems: All systems reviewed  and negative except where noted in HPI.   Lab Results  Component Value Date   IRON 130 05/13/2017   FERRITIN 253.0 05/13/2017    Lab Results  Component Value Date   WBC 5.8 04/07/2017   HGB 14.7 04/07/2017   HCT 43.5 04/07/2017   MCV 94.0 04/07/2017   PLT 268.0 04/07/2017    Lab Results  Component Value Date   CREATININE 0.75 04/07/2017   BUN 14 04/07/2017   NA 141 04/07/2017   K 3.9 04/07/2017   CL 106 04/07/2017   CO2 23 04/07/2017    Lab Results  Component Value Date   ALT 10 06/24/2017   AST 15 06/24/2017   ALKPHOS 54 06/24/2017   BILITOT 0.8 06/24/2017      Physical Exam: BP (!) 144/84 (BP Location: Left Arm, Patient Position: Sitting, Cuff Size: Normal)   Pulse 68   Ht 5' 6.25" (1.683 m) Comment: height measured without shoes  Wt 130 lb (59 kg)   LMP  07/19/2008   BMI 20.82 kg/m  Constitutional: Pleasant,well-developed, female in no acute distress. HEENT: Normocephalic and atraumatic. Conjunctivae are normal. No scleral icterus. Neck supple.  Cardiovascular: Normal rate, regular rhythm.  Pulmonary/chest: Effort normal and breath sounds normal. No wheezing, rales or rhonchi. Abdominal: Soft, nondistended, nontender. There are no masses palpable. No hepatomegaly. Extremities: no edema Lymphadenopathy: No cervical adenopathy noted. Neurological: Alert and oriented to person place and time. Skin: Skin is warm and dry. No rashes noted. Psychiatric: Normal mood and affect. Behavior is normal.   ASSESSMENT AND PLAN: 61 year old female here for follow-up of hereditary hemochromatosis.  As above she has a c282Y homozygous mutation, but has never required phlebotomy. In accordance with guidelines, given she has no evidence of iron overload, would continue to monitor as long as her ALT is normal, ferritin < 500 (or without a large increase), and transferrin saturation is < 60%. She is concerned her ferritin is slightly increased from prior baseline - she has had a ferritin around 180-190 for the past 6 years, now around 250. We'll repeat this in April/early May time frame to doesn't continue to rise, and if stable with then repeat in one year. She has no evidence of diabetes, her TSH is normal, no skin changes. She should otherwise continue to avoid routine alcohol intake, and avoid vitamin C supplementation.   In regards to her questions about osteoporosis screening, given she is postmenopausal a routine DEXA scan seems appropriate and I will order this for her. We will also check baseline vitamin D levels to ensure no deficiency. I will let her know the results. Otherwise follow-up in one year for hemochromatosis.  Craig Beach Cellar, MD [facility] Pager [phone]

## 2017-07-25 ENCOUNTER — Encounter: Payer: Self-pay | Admitting: Gastroenterology

## 2017-07-26 ENCOUNTER — Telehealth: Payer: Self-pay

## 2017-07-26 NOTE — Telephone Encounter (Signed)
Magdalene River, CMA, spoke to pt and relayed result and recommendations to pt:    Result Notes for DG Bone Density   Notes recorded by Elias Else, CMA on 07/25/2017 at 4:42 PM EST Pt has been notified and aware. She states understanding and agrees to start calcium and continue her vit. D. ------  Notes recorded by Elias Else, CMA on 07/25/2017 at 2:39 PM EST Left message to return call. ------  Notes recorded by Armbruster, Carlota Raspberry, MD on 07/25/2017 at 7:33 AM EST Jan can you let Mrs. Anita Schmidt know her DEXA scan shows mild osteopenia which is new for her.  Recommend optimizing calcium (1200 mg/day) and vitamin D intake.  Not sure how much vitamin D she is taking now, but her level recently was normal. She should be taking around 1000 IU / day Repeat DEXA is appropriate after 2 years.  Thanks

## 2017-08-05 ENCOUNTER — Encounter: Payer: Self-pay | Admitting: Gastroenterology

## 2017-10-20 ENCOUNTER — Encounter: Payer: Self-pay | Admitting: Gastroenterology

## 2017-10-23 ENCOUNTER — Other Ambulatory Visit: Payer: Self-pay

## 2017-10-23 ENCOUNTER — Encounter (HOSPITAL_BASED_OUTPATIENT_CLINIC_OR_DEPARTMENT_OTHER): Payer: Self-pay | Admitting: Emergency Medicine

## 2017-10-23 ENCOUNTER — Emergency Department (HOSPITAL_BASED_OUTPATIENT_CLINIC_OR_DEPARTMENT_OTHER): Payer: 59

## 2017-10-23 ENCOUNTER — Emergency Department (HOSPITAL_BASED_OUTPATIENT_CLINIC_OR_DEPARTMENT_OTHER)
Admission: EM | Admit: 2017-10-23 | Discharge: 2017-10-23 | Disposition: A | Discharge status: Home / Self Care | Payer: 59 | Attending: Emergency Medicine | Admitting: Emergency Medicine

## 2017-10-23 DIAGNOSIS — J069 Acute upper respiratory infection, unspecified: Secondary | ICD-10-CM | POA: Diagnosis not present

## 2017-10-23 DIAGNOSIS — Z85828 Personal history of other malignant neoplasm of skin: Secondary | ICD-10-CM | POA: Diagnosis not present

## 2017-10-23 DIAGNOSIS — R0981 Nasal congestion: Secondary | ICD-10-CM | POA: Insufficient documentation

## 2017-10-23 DIAGNOSIS — R05 Cough: Secondary | ICD-10-CM | POA: Diagnosis present

## 2017-10-23 DIAGNOSIS — Z91040 Latex allergy status: Secondary | ICD-10-CM | POA: Insufficient documentation

## 2017-10-23 DIAGNOSIS — R059 Cough, unspecified: Secondary | ICD-10-CM

## 2017-10-23 DIAGNOSIS — R509 Fever, unspecified: Secondary | ICD-10-CM | POA: Diagnosis not present

## 2017-10-23 LAB — URINALYSIS, ROUTINE W REFLEX MICROSCOPIC
Bilirubin Urine: NEGATIVE
Glucose, UA: NEGATIVE mg/dL
Hgb urine dipstick: NEGATIVE
Ketones, ur: NEGATIVE mg/dL
Nitrite: NEGATIVE
Protein, ur: NEGATIVE mg/dL
Specific Gravity, Urine: <1.005 — ABNORMAL LOW (ref 1.005–1.030)
pH: 7 (ref 5.0–8.0)

## 2017-10-23 LAB — URINALYSIS, MICROSCOPIC (REFLEX): RBC / HPF: NONE SEEN RBC/hpf (ref 0–5)

## 2017-10-23 MED ORDER — ALBUTEROL SULFATE (2.5 MG/3ML) 0.083% IN NEBU
2.5000 mg | INHALATION_SOLUTION | Freq: Once | RESPIRATORY_TRACT | Status: AC
  Administered 2017-10-23: 2.5 mg via RESPIRATORY_TRACT
  Filled 2017-10-23: qty 3

## 2017-10-23 MED ORDER — BENZONATATE 100 MG PO CAPS: 100.0000 mg | ORAL_CAPSULE | Freq: Three times a day (TID) | ORAL | 0 refills | Status: DC

## 2017-10-23 MED ORDER — FLUTICASONE PROPIONATE 50 MCG/ACT NA SUSP: 1.0000 | Freq: Every day | NASAL | 2 refills | Status: DC

## 2017-10-23 NOTE — ED Provider Notes (Signed)
[facility] EMERGENCY DEPARTMENT Provider Note   CSN: [id number] Arrival date & time: 10/23/17  1042     History   Chief Complaint Chief Complaint  Patient presents with  . Cough    HPI Anita Schmidt is a 62 y.o. female with PMH/o anxiety who presents for evaluation of 1 week of cough and nasal congestion. Patient reports that cough is productive. She states that at onset of symptoms, she also had maxillary sinus pressure and a low grade fever of 100.6. Patient reports she was evaluated by Urgent Care at onset of symptoms. She was instructed to take tylenol. Patient reports she has been taking children's claritin with minimal improvement. Patient denies any history of COPD or asthma. Patient is not a current smoker. Patient denies any fever, CP, SOB, abdominal pain, nausea/vomiting.   The history is provided by the patient.    Past Medical History:  Diagnosis Date  . Anxiety    severe  . Anxiety disorder   . Fibrocystic breast disease   . Gilbert's syndrome   . Hemochromatosis   . Hyperlipidemia   . Skin cancer, basal cell    nose    Patient Active Problem List   Diagnosis Date Noted  . Hyperglycemia 04/07/2017  . Insect bite 04/07/2017  . Cardiac murmur 12/29/2016  . Pes anserine bursitis 10/19/2016  . Scapular dyskinesis 10/19/2016  . Preventative health care 03/11/2016  . Generalized anxiety disorder 05/24/2013  . Elevated blood pressure reading without diagnosis of hypertension 05/23/2013  . Diverticulosis of colon without hemorrhage 04/27/2013  . Basal cell cancer 04/27/2013  . Hemochromatosis   . Bee sting allergy 03/26/2011  . IBS 02/10/2010  . Anxiety state 12/25/2008  . [place] DISEASE, CERVICAL 11/19/2008  . RHINITIS 09/19/2008  . HYPERLIPIDEMIA 08/01/2008  . 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 MENOPAUSAL SYNDROME 08/01/2008  . FIBROCYSTIC BREAST DISEASE, HX OF 08/01/2008    Past Surgical History:  Procedure Laterality Date  . BREAST BIOPSY     . colonscopy  2011   Tics ; Dr Sharlett Iles  . DILATION AND CURETTAGE OF UTERUS    . INGUINAL HERNIA REPAIR    . MOHS SURGERY  2012   nose  . TONSILLECTOMY    . Tracer Pellets  2003   in breast; [place]     OB History    Gravida  2   Para      Term      Preterm      AB  2   Living  0     SAB      TAB      Ectopic      Multiple      Live Births               Home Medications    Prior to Admission medications   Medication Sig Start Date End Date Taking? Authorizing Provider  benzonatate (TESSALON) 100 MG capsule Take 1 capsule (100 mg total) by mouth every 8 (eight) hours. 10/23/17   Volanda Napoleon, PA-C  fluticasone (FLONASE) 50 MCG/ACT nasal spray Place 1 spray into both nostrils daily. 10/23/17   Volanda Napoleon, PA-C  SALINE NASAL SPRAY NA Place 4 sprays into both nostrils 4 (four) times daily.    [provider]    Family History Family History  Problem Relation Age of Onset  . Hypertension Mother        PMH  . Alcohol abuse Mother   . Parkinson's disease  Mother   . Anxiety disorder Brother   . Depression Brother   . Alzheimer's disease Maternal Grandmother   . Hypertension Maternal Grandmother   . Stroke Paternal Uncle   . Alzheimer's disease Paternal Uncle   . Diabetes Neg Hx     Social History Social History   Tobacco Use  . Smoking status: Never Smoker  . Smokeless tobacco: Never Used  Substance Use Topics  . Alcohol use: Yes    Alcohol/week: 6.0 oz    Types: 10 Standard drinks or equivalent per week    Comment: drinks 10--4oz drinks/week  . Drug use: No     Allergies   Latex; Epinephrine; Effexor [venlafaxine]; Amoxicillin; and Citalopram   Review of Systems Review of Systems  Constitutional: Negative for fever.  HENT: Positive for congestion and sinus pressure.   Respiratory: Positive for cough. Negative for shortness of breath.   Cardiovascular: Negative for chest pain.  Gastrointestinal: Negative for abdominal  pain, nausea and vomiting.  Genitourinary: Negative for dysuria and hematuria.  Neurological: Negative for headaches.     Physical Exam Updated Vital Signs BP (!) 180/90 (BP Location: Right Arm)   Pulse 62   Temp 98.3 F (36.8 C) (Oral)   Resp 16   Ht 5' 6"  (1.676 m)   Wt 59 kg (130 lb)   LMP 07/19/2008   SpO2 98%   BMI 20.98 kg/m   Physical Exam  Constitutional: She appears well-developed and well-nourished.  HENT:  Head: Normocephalic and atraumatic.  Nose: Mucosal edema and rhinorrhea present. Right sinus exhibits no maxillary sinus tenderness and no frontal sinus tenderness. Left sinus exhibits no maxillary sinus tenderness and no frontal sinus tenderness.  Eyes: Conjunctivae and EOM are normal. Right eye exhibits no discharge. Left eye exhibits no discharge. No scleral icterus.  Pulmonary/Chest: Effort normal. She has wheezes in the right middle field and the left middle field.  No evidence of respiratory distress. Able to speak in full sentences without difficulty. Mild wheezing noted to the mid lung fields, R>L.   Neurological: She is alert.  Skin: Skin is warm and dry.  Psychiatric: She has a normal mood and affect. Her speech is normal and behavior is normal.  Nursing note and vitals reviewed.    ED Treatments / Results  Labs (all labs ordered are listed, but only abnormal results are displayed) Labs Reviewed  URINALYSIS, ROUTINE W REFLEX MICROSCOPIC - Abnormal; Notable for the following components:      Result Value   Color, Urine STRAW (*)    Specific Gravity, Urine <1.005 (*)    Leukocytes, UA SMALL (*)    All other components within normal limits  URINALYSIS, MICROSCOPIC (REFLEX) - Abnormal; Notable for the following components:   Bacteria, UA FEW (*)    Squamous Epithelial / LPF 0-5 (*)    All other components within normal limits    EKG None  Radiology Dg Chest 2 View  Result Date: 10/23/2017 CLINICAL DATA:  Cough and sinus pressure for 1 week.  EXAM: CHEST - 2 VIEW COMPARISON:  None. FINDINGS: Normal heart, mediastinum and hila. Lungs are hyperexpanded but clear. No pleural effusion or pneumothorax. Skeletal structures are intact. IMPRESSION: No active cardiopulmonary disease. Electronically Signed   By: Lajean Manes M.D.   On: 10/23/2017 12:14    Procedures Procedures (including critical care time)  Medications Ordered in ED Medications  albuterol (PROVENTIL) (2.5 MG/3ML) 0.083% nebulizer solution 2.5 mg (2.5 mg Nebulization Given 10/23/17 1141)  Initial Impression / Assessment and Plan / ED Course  I have reviewed the triage vital signs and the nursing notes.  Pertinent labs & imaging results that were available during my care of the patient were reviewed by me and considered in my medical decision making (see chart for details).     62 y.o. F with PMH/o anxiety who presents for evaluation of cough and nasal congestion x 1 week. Initially also had fever (Tmax 100.6) and sinus pressure. Now resolved. No CP, SOB, abdominal pain. No history of COPD or asthma. Patient is afebrile, non-toxic appearing, sitting comfortably on examination table. Vital signs reviewed and stable. Patient is slightly hypertensive. Lungs show some wheezing to the mid lung fields. Consider URI vs sinusitis vs pneumonia. Plan for CXR. Will give nebulizer treatment in the ED to help with wheezing.   RN stated that patient was having some urinary frequency and would like to be tested for UTI.  Urine sample sent.  Reevaluation after nebulizer treatment.  Patient reports improvement in symptoms.  No wheezing noted on exam.  Chest x-ray reviewed.  Negative for any acute infectious etiology.  UA shows leukocytes but no other acute infectious etiology symptoms.  UA is not concerning for UTI.  Discussed results with patient.  Will plan for supportive at home therapies.  Patient instructed to follow-up with her primary care doctor the next 2-4 days for further  evaluation. Patient had ample opportunity for questions and discussion. All patient's questions were answered with full understanding. Strict return precautions discussed. Patient expresses understanding and agreement to plan.   Final Clinical Impressions(s) / ED Diagnoses   Final diagnoses:  Upper respiratory tract infection, unspecified type  Cough    ED Discharge Orders        Ordered    fluticasone (FLONASE) 50 MCG/ACT nasal spray  Daily     10/23/17 1318    benzonatate (TESSALON) 100 MG capsule  Every 8 hours     10/23/17 1318       Desma Mcgregor 10/23/17 2041    Tegeler, Gwenyth Allegra, MD 10/24/17 1109

## 2017-10-23 NOTE — ED Triage Notes (Signed)
Cough and sinus pressure x 1 week.

## 2017-10-23 NOTE — Discharge Instructions (Signed)
You can take Tylenol or Ibuprofen as directed for pain. You can alternate Tylenol and Ibuprofen every 4 hours. If you take Tylenol at 1pm, then you can take Ibuprofen at 5pm. Then you can take Tylenol again at 9pm.   Use flonase for congestion.   You can take the tessalon perles for cough.  Return to the Emergency Department for any fever, cough, chest pain, difficulty breathing or any other worsening or concerning symptoms.

## 2017-10-24 ENCOUNTER — Emergency Department (HOSPITAL_BASED_OUTPATIENT_CLINIC_OR_DEPARTMENT_OTHER)
Admission: EM | Admit: 2017-10-24 | Discharge: 2017-10-24 | Disposition: A | Discharge status: Home / Self Care | Payer: 59 | Attending: Emergency Medicine | Admitting: Emergency Medicine

## 2017-10-24 ENCOUNTER — Encounter (HOSPITAL_BASED_OUTPATIENT_CLINIC_OR_DEPARTMENT_OTHER): Payer: Self-pay

## 2017-10-24 DIAGNOSIS — J40 Bronchitis, not specified as acute or chronic: Secondary | ICD-10-CM | POA: Diagnosis not present

## 2017-10-24 DIAGNOSIS — R0981 Nasal congestion: Secondary | ICD-10-CM | POA: Diagnosis not present

## 2017-10-24 DIAGNOSIS — Z85828 Personal history of other malignant neoplasm of skin: Secondary | ICD-10-CM | POA: Insufficient documentation

## 2017-10-24 DIAGNOSIS — R0602 Shortness of breath: Secondary | ICD-10-CM | POA: Diagnosis present

## 2017-10-24 DIAGNOSIS — R05 Cough: Secondary | ICD-10-CM | POA: Diagnosis not present

## 2017-10-24 DIAGNOSIS — Z79899 Other long term (current) drug therapy: Secondary | ICD-10-CM | POA: Insufficient documentation

## 2017-10-24 DIAGNOSIS — Z91040 Latex allergy status: Secondary | ICD-10-CM | POA: Insufficient documentation

## 2017-10-24 MED ORDER — ALBUTEROL SULFATE HFA 108 (90 BASE) MCG/ACT IN AERS: 2.0000 | INHALATION_SPRAY | RESPIRATORY_TRACT | 0 refills | Status: DC | PRN

## 2017-10-24 MED ORDER — DEXAMETHASONE 6 MG PO TABS
10.0000 mg | ORAL_TABLET | Freq: Once | ORAL | Status: AC
  Administered 2017-10-24: 10 mg via ORAL
  Filled 2017-10-24: qty 1

## 2017-10-24 MED ORDER — ALBUTEROL SULFATE HFA 108 (90 BASE) MCG/ACT IN AERS
2.0000 | INHALATION_SPRAY | Freq: Once | RESPIRATORY_TRACT | Status: AC
  Administered 2017-10-24: 2 via RESPIRATORY_TRACT
  Filled 2017-10-24: qty 6.7

## 2017-10-24 NOTE — ED Provider Notes (Signed)
[facility] EMERGENCY DEPARTMENT Provider Note   CSN: [id number] Arrival date & time: 10/24/17  0425     History   Chief Complaint Chief Complaint  Patient presents with  . Cough  . Shortness of Breath    HPI Anita Schmidt is a 62 y.o. female.  HPI  This is a 62 year old female with a history of anxiety disorder, upper lipidemia who presents with shortness of breath.  Patient reports she woke up this morning and felt like she could not take a deep breath.  She was seen and evaluated yesterday for upper respiratory symptoms.  She reports 10-day history of congestion, cough.  She had fevers last week.  She received a nebulizer treatment while in the ED yesterday with some improvement of her symptoms.  She is taking a nasal steroid.  She reports "my cough is breaking up."  No recent fevers.  She states "I think I just got anxious this morning because I could not take a deep breath."  She did have a chest x-ray yesterday that was without evidence of pneumonia.  She denies any history of smoking, asthma, COPD.  She denies chest pain  Past Medical History:  Diagnosis Date  . Anxiety    severe  . Anxiety disorder   . Fibrocystic breast disease   . Gilbert's syndrome   . Hemochromatosis   . Hyperlipidemia   . Skin cancer, basal cell    nose    Patient Active Problem List   Diagnosis Date Noted  . Hyperglycemia 04/07/2017  . Insect bite 04/07/2017  . Cardiac murmur 12/29/2016  . Pes anserine bursitis 10/19/2016  . Scapular dyskinesis 10/19/2016  . Preventative health care 03/11/2016  . Generalized anxiety disorder 05/24/2013  . Elevated blood pressure reading without diagnosis of hypertension 05/23/2013  . Diverticulosis of colon without hemorrhage 04/27/2013  . Basal cell cancer 04/27/2013  . Hemochromatosis   . Bee sting allergy 03/26/2011  . IBS 02/10/2010  . Anxiety state 12/25/2008  . [place] DISEASE, CERVICAL 11/19/2008  . RHINITIS 09/19/2008  .  HYPERLIPIDEMIA 08/01/2008  . 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 MENOPAUSAL SYNDROME 08/01/2008  . FIBROCYSTIC BREAST DISEASE, HX OF 08/01/2008    Past Surgical History:  Procedure Laterality Date  . BREAST BIOPSY    . colonscopy  2011   Tics ; Dr Sharlett Iles  . DILATION AND CURETTAGE OF UTERUS    . INGUINAL HERNIA REPAIR    . MOHS SURGERY  2012   nose  . TONSILLECTOMY    . Tracer Pellets  2003   in breast; [place]     OB History    Gravida  2   Para      Term      Preterm      AB  2   Living  0     SAB      TAB      Ectopic      Multiple      Live Births               Home Medications    Prior to Admission medications   Medication Sig Start Date End Date Taking? Authorizing Provider  fluticasone (FLONASE) 50 MCG/ACT nasal spray Place 1 spray into both nostrils daily. 10/23/17  Yes Volanda Napoleon, PA-C  SALINE NASAL SPRAY NA Place 4 sprays into both nostrils 4 (four) times daily.   Yes [provider]  albuterol (PROVENTIL HFA;VENTOLIN HFA) 108 (90 Base) MCG/ACT inhaler Inhale  2 puffs into the lungs every 4 (four) hours as needed for wheezing or shortness of breath. 10/24/17   Horton, Barbette Hair, MD  benzonatate (TESSALON) 100 MG capsule Take 1 capsule (100 mg total) by mouth every 8 (eight) hours. 10/23/17   Volanda Napoleon, PA-C    Family History Family History  Problem Relation Age of Onset  . Hypertension Mother        PMH  . Alcohol abuse Mother   . Parkinson's disease Mother   . Anxiety disorder Brother   . Depression Brother   . Alzheimer's disease Maternal Grandmother   . Hypertension Maternal Grandmother   . Stroke Paternal Uncle   . Alzheimer's disease Paternal Uncle   . Diabetes Neg Hx     Social History Social History   Tobacco Use  . Smoking status: Never Smoker  . Smokeless tobacco: Never Used  Substance Use Topics  . Alcohol use: Yes    Alcohol/week: 1.2 oz    Types: 2 Glasses of wine per week  . Drug use: No      Allergies   Latex; Epinephrine; Effexor [venlafaxine]; Amoxicillin; and Citalopram   Review of Systems Review of Systems  Constitutional: Negative for fever.  HENT: Positive for congestion.   Respiratory: Positive for cough and shortness of breath.   Cardiovascular: Negative for chest pain.  Gastrointestinal: Positive for nausea and vomiting. Negative for abdominal pain and diarrhea.  Genitourinary: Negative for dysuria.  All other systems reviewed and are negative.    Physical Exam Updated Vital Signs BP (!) 160/88 (BP Location: Right Arm)   Pulse 63   Temp 98.9 F (37.2 C) (Oral)   Resp 16   Ht 5' 6"  (1.676 m)   Wt 59 kg (130 lb)   LMP 07/19/2008   SpO2 100%   BMI 20.98 kg/m   Physical Exam  Constitutional: She is oriented to person, place, and time. She appears well-developed and well-nourished.  Anxious appearing, no acute distress  HENT:  Head: Normocephalic and atraumatic.  Mouth/Throat: Oropharynx is clear and moist.  Neck: Neck supple.  Cardiovascular: Normal rate, regular rhythm and normal heart sounds.  Pulmonary/Chest: Effort normal. No respiratory distress. She has no decreased breath sounds. She has no wheezes.  Abdominal: Soft. Bowel sounds are normal.  Neurological: She is alert and oriented to person, place, and time.  Skin: Skin is warm and dry.  Psychiatric: She has a normal mood and affect.  Nursing note and vitals reviewed.    ED Treatments / Results  Labs (all labs ordered are listed, but only abnormal results are displayed) Labs Reviewed - No data to display  EKG EKG Interpretation  Date/Time:  Monday October 24 2017 05:52:06 EDT Ventricular Rate:  62 PR Interval:    QRS Duration: 94 QT Interval:  432 QTC Calculation: 439 R Axis:   -2 Text Interpretation:  Sinus rhythm Probable left atrial enlargement Probable left ventricular hypertrophy Nonspecific changes Confirmed by Thayer Jew [phone]) on 10/24/2017 5:57:39  AM   Radiology Dg Chest 2 View  Result Date: 10/23/2017 CLINICAL DATA:  Cough and sinus pressure for 1 week. EXAM: CHEST - 2 VIEW COMPARISON:  None. FINDINGS: Normal heart, mediastinum and hila. Lungs are hyperexpanded but clear. No pleural effusion or pneumothorax. Skeletal structures are intact. IMPRESSION: No active cardiopulmonary disease. Electronically Signed   By: Lajean Manes M.D.   On: 10/23/2017 12:14    Procedures Procedures (including critical care time)  Medications Ordered in ED Medications  dexamethasone (DECADRON) tablet 10 mg (10 mg Oral Given 10/24/17 0509)  albuterol (PROVENTIL HFA;VENTOLIN HFA) 108 (90 Base) MCG/ACT inhaler 2 puff (2 puffs Inhalation Given 10/24/17 0459)     Initial Impression / Assessment and Plan / ED Course  I have reviewed the triage vital signs and the nursing notes.  Pertinent labs & imaging results that were available during my care of the patient were reviewed by me and considered in my medical decision making (see chart for details).     Patient presents with persistent cough and shortness of breath.  Some improvement with a nebulizer yesterday.  She is overall nontoxic appearing.  Afebrile.  Heart rate 63.  O2 sats 100% without any acute distress.  Her breath sounds are clear in all lung fields.  However, given improvement with nebulizer, patient was given an inhaler and a dose of Decadron for concern for bronchitis.  On recheck, she states that she feels much better.  I have reviewed her chest x-ray from yesterday which was clear.  Additionally, EKG was obtained and shows no evidence of acute ischemia.  Doubt PE, given pulse ox/heart rate and recent infectious symptoms.  Patient discharged with inhaler for symptom control.  Follow-up with primary physician recommended.  After history, exam, and medical workup I feel the patient has been appropriately medically screened and is safe for discharge home. Pertinent diagnoses were discussed with the  patient. Patient was given return precautions.  Final Clinical Impressions(s) / ED Diagnoses   Final diagnoses:  Bronchitis  Shortness of breath    ED Discharge Orders        Ordered    albuterol (PROVENTIL HFA;VENTOLIN HFA) 108 (90 Base) MCG/ACT inhaler  Every 4 hours PRN     10/24/17 0555       Merryl Hacker, MD 10/24/17 [phone]

## 2017-10-24 NOTE — ED Triage Notes (Signed)
Patient c/o productive cough and intermittent SHOB X 1 week.

## 2017-10-24 NOTE — ED Notes (Signed)
Pt ambulated around dept on room air. SATS remained 97-98% with no distress noted

## 2017-10-24 NOTE — ED Notes (Signed)
ED Provider at bedside. 

## 2017-10-27 ENCOUNTER — Encounter: Payer: Self-pay | Admitting: Internal Medicine

## 2017-10-27 ENCOUNTER — Ambulatory Visit (INDEPENDENT_AMBULATORY_CARE_PROVIDER_SITE_OTHER): Payer: 59 | Admitting: Internal Medicine

## 2017-10-27 VITALS — BP 148/96 | HR 78 | Temp 98.4°F | Ht 66.0 in | Wt 128.0 lb

## 2017-10-27 DIAGNOSIS — R059 Cough, unspecified: Secondary | ICD-10-CM | POA: Insufficient documentation

## 2017-10-27 DIAGNOSIS — Z0000 Encounter for general adult medical examination without abnormal findings: Secondary | ICD-10-CM | POA: Diagnosis not present

## 2017-10-27 DIAGNOSIS — F411 Generalized anxiety disorder: Secondary | ICD-10-CM

## 2017-10-27 DIAGNOSIS — R450 Nervousness: Secondary | ICD-10-CM | POA: Insufficient documentation

## 2017-10-27 DIAGNOSIS — R062 Wheezing: Secondary | ICD-10-CM

## 2017-10-27 DIAGNOSIS — R05 Cough: Secondary | ICD-10-CM

## 2017-10-27 DIAGNOSIS — F419 Anxiety disorder, unspecified: Secondary | ICD-10-CM | POA: Insufficient documentation

## 2017-10-27 MED ORDER — PREDNISONE 10 MG PO TABS: ORAL_TABLET | ORAL | 0 refills | Status: DC

## 2017-10-27 MED ORDER — LEVALBUTEROL TARTRATE 45 MCG/ACT IN AERO: 2.0000 | INHALATION_SPRAY | Freq: Four times a day (QID) | RESPIRATORY_TRACT | 2 refills | Status: DC | PRN

## 2017-10-27 MED ORDER — METHYLPREDNISOLONE ACETATE 80 MG/ML IJ SUSP
80.0000 mg | Freq: Once | INTRAMUSCULAR | Status: AC
  Administered 2017-10-27: 80 mg via INTRAMUSCULAR

## 2017-10-27 MED ORDER — AZITHROMYCIN 250 MG PO TABS: ORAL_TABLET | ORAL | 1 refills | Status: DC

## 2017-10-27 NOTE — Assessment & Plan Note (Signed)
With situational worsening, tried to reassure pt, has friend with her for support, cont same tx

## 2017-10-27 NOTE — Assessment & Plan Note (Signed)
[place] for change albuterol HFA to xopenex if ok with insurance

## 2017-10-27 NOTE — Progress Notes (Signed)
Subjective:    Patient ID: Anita Schmidt, female    DOB: 1955-08-11, 62 y.o.   MRN: [id number]  HPI  Here to f/u recent URI and bronchitis like symptoms tx conservatively at UC x 2 wks now worsening sob and mild wheezing, persistent cough and low grade temp, with albutrerol HFA making her more nervous and jittery with having to use every 4 hrs.  She is aware she is much more nervous and wondering if this is the main problem.  Pt denies chest pain, orthopnea, PND, increased LE swelling, palpitations, dizziness or syncope.  Pt denies polydipsia, polyuria, Past Medical History:  Diagnosis Date  . Anxiety    severe  . Anxiety disorder   . Fibrocystic breast disease   . Gilbert's syndrome   . Hemochromatosis   . Hyperlipidemia   . Skin cancer, basal cell    nose   Past Surgical History:  Procedure Laterality Date  . BREAST BIOPSY    . colonscopy  2011   Tics ; Dr Sharlett Iles  . DILATION AND CURETTAGE OF UTERUS    . INGUINAL HERNIA REPAIR    . MOHS SURGERY  2012   nose  . TONSILLECTOMY    . Tracer Pellets  2003   in breast; [place]    reports that she has never smoked. She has never used smokeless tobacco. She reports that she drinks about 1.2 oz of alcohol per week. She reports that she does not use drugs. family history includes Alcohol abuse in her mother; Alzheimer's disease in her maternal grandmother and paternal uncle; Anxiety disorder in her brother; Depression in her brother; Hypertension in her maternal grandmother and mother; Parkinson's disease in her mother; Stroke in her paternal uncle. Allergies  Allergen Reactions  . Latex Itching    Redness & itching Because of a history of documented adverse serious drug reaction;Medi Alert bracelet  is recommended  . Epinephrine     ? jittery  . Effexor [Venlafaxine]     Shaking, elevated BP,full blown panic attack  Hypertension with systolic blood pressure 710 with severe nausea  . Amoxicillin     REACTION: GI UPSET  .  Citalopram     ? nausea   Current Outpatient Medications on File Prior to Visit  Medication Sig Dispense Refill  . benzonatate (TESSALON) 100 MG capsule Take 1 capsule (100 mg total) by mouth every 8 (eight) hours. 21 capsule 0  . fluticasone (FLONASE) 50 MCG/ACT nasal spray Place 1 spray into both nostrils daily. 16 g 2  . SALINE NASAL SPRAY NA Place 4 sprays into both nostrils 4 (four) times daily.     No current facility-administered medications on file prior to visit.    Review of Systems  Constitutional: Negative for other unusual diaphoresis or sweats HENT: Negative for ear discharge or swelling Eyes: Negative for other worsening visual disturbances Respiratory: Negative for stridor or other swelling  Gastrointestinal: Negative for worsening distension or other blood Genitourinary: Negative for retention or other urinary change Musculoskeletal: Negative for other MSK pain or swelling Skin: Negative for color change or other new lesions Neurological: Negative for worsening tremors and other numbness  Psychiatric/Behavioral: Negative for worsening agitation or other fatigue All other system neg per pt    Objective:   Physical Exam BP (!) 148/96   Pulse 78   Temp 98.4 F (36.9 C) (Oral)   Ht 5' 6"  (1.676 m)   Wt 128 lb (58.1 kg)   LMP 07/19/2008   SpO2  98%   BMI 20.66 kg/m  VS noted, mild ill appearing Constitutional: Pt appears in NAD HENT: Head: NCAT.  Right Ear: External ear normal.  Left Ear: External ear normal.  Bilat tm's with mild erythema.  Max sinus areas non tender.  Pharynx with mild erythema, no exudate Eyes: . Pupils are equal, round, and reactive to light. Conjunctivae and EOM are normal Nose: without d/c or deformity Neck: Neck supple. Gross normal ROM Cardiovascular: Normal rate and regular rhythm.   Pulmonary/Chest: Effort normal and breath sounds decreased without rales or wheezing.  Neurological: Pt is alert. At baseline orientation, motor grossly  intact Skin: Skin is warm. No rashes, other new lesions, no LE edema Psychiatric: Pt behavior is normal without agitation , 2+ nervous No other exam findings  Lab Results  Component Value Date   WBC 5.8 04/07/2017   HGB 14.7 04/07/2017   HCT 43.5 04/07/2017   PLT 268.0 04/07/2017   GLUCOSE 97 04/07/2017   CHOL 172 04/07/2017   TRIG 56.0 04/07/2017   HDL 64.70 04/07/2017   LDLDIRECT 131.8 08/01/2008   LDLCALC 96 04/07/2017   ALT 10 06/24/2017   AST 15 06/24/2017   NA 141 04/07/2017   K 3.9 04/07/2017   CL 106 04/07/2017   CREATININE 0.75 04/07/2017   BUN 14 04/07/2017   CO2 23 04/07/2017   TSH 3.28 04/07/2017   HGBA1C 5.7 04/07/2017       Assessment & Plan:

## 2017-10-27 NOTE — Assessment & Plan Note (Signed)
/  Mild to mod, for predpac asd,  to f/u any worsening symptoms or concerns 

## 2017-10-27 NOTE — Assessment & Plan Note (Signed)
?   Atypical infection, Mild to mod, for antibx course,  to f/u any worsening symptoms or concerns

## 2017-10-27 NOTE — Patient Instructions (Addendum)
You had the steroid shot today  Please take all new medication as prescribed - the antibiotic, and prednisone  OK to change the Albuterol inhaler to Xopenex inhaler every 6 hrs as needed  Please continue all other medications as before, and refills have been done if requested.  Please have the pharmacy call with any other refills you may need.  Please keep your appointments with your specialists as you may have planned  You are given the work note  Please return in 6 months, or sooner if needed, with Lab testing done 3-5 days before

## 2017-10-28 ENCOUNTER — Encounter: Payer: Self-pay | Admitting: Internal Medicine

## 2017-11-01 ENCOUNTER — Encounter: Payer: Self-pay | Admitting: Internal Medicine

## 2017-11-01 ENCOUNTER — Ambulatory Visit: Payer: 59 | Admitting: Internal Medicine

## 2017-11-01 DIAGNOSIS — Z0289 Encounter for other administrative examinations: Secondary | ICD-10-CM

## 2017-11-03 ENCOUNTER — Ambulatory Visit: Payer: 59 | Admitting: Internal Medicine

## 2017-11-03 ENCOUNTER — Encounter: Payer: Self-pay | Admitting: Internal Medicine

## 2017-11-07 ENCOUNTER — Encounter: Payer: Self-pay | Admitting: Internal Medicine

## 2017-11-08 ENCOUNTER — Ambulatory Visit (INDEPENDENT_AMBULATORY_CARE_PROVIDER_SITE_OTHER): Payer: 59 | Admitting: Internal Medicine

## 2017-11-08 ENCOUNTER — Encounter: Payer: Self-pay | Admitting: Internal Medicine

## 2017-11-08 ENCOUNTER — Other Ambulatory Visit: Payer: 59

## 2017-11-08 VITALS — BP 134/86 | HR 77 | Temp 98.1°F | Ht 66.0 in | Wt 130.0 lb

## 2017-11-08 DIAGNOSIS — R739 Hyperglycemia, unspecified: Secondary | ICD-10-CM | POA: Diagnosis not present

## 2017-11-08 DIAGNOSIS — R059 Cough, unspecified: Secondary | ICD-10-CM

## 2017-11-08 DIAGNOSIS — R030 Elevated blood-pressure reading, without diagnosis of hypertension: Secondary | ICD-10-CM

## 2017-11-08 DIAGNOSIS — F411 Generalized anxiety disorder: Secondary | ICD-10-CM

## 2017-11-08 DIAGNOSIS — R05 Cough: Secondary | ICD-10-CM

## 2017-11-08 DIAGNOSIS — Z114 Encounter for screening for human immunodeficiency virus [HIV]: Secondary | ICD-10-CM

## 2017-11-08 DIAGNOSIS — Z1159 Encounter for screening for other viral diseases: Secondary | ICD-10-CM

## 2017-11-08 DIAGNOSIS — R062 Wheezing: Secondary | ICD-10-CM | POA: Diagnosis not present

## 2017-11-08 NOTE — Assessment & Plan Note (Signed)
BP Readings from Last 3 Encounters:  11/08/17 134/86  10/27/17 (!) 148/96  10/24/17 (!) 160/88  iomproved,  to f/u any worsening symptoms or concerns

## 2017-11-08 NOTE — Assessment & Plan Note (Signed)
Severe recently situationally in conjunction with recent respiratory illness, pt reassured, to f/u with psychiatry and counseling, declines other referral or med tx

## 2017-11-08 NOTE — Assessment & Plan Note (Signed)
Has some mild persistent non prod cough, exam benign, ok for OTC delsym prn, recent cxr neg for acute

## 2017-11-08 NOTE — Assessment & Plan Note (Signed)
stable overall by history and exam, recent data reviewed with pt, and pt to continue medical treatment as before,  to f/u any worsening symptoms or concerns Lab Results  Component Value Date   HGBA1C 5.7 04/07/2017

## 2017-11-08 NOTE — Patient Instructions (Signed)
Please continue all other medications as before, and refills have been done if requested.  Please have the pharmacy call with any other refills you may need.  Please continue your efforts at being more active, low cholesterol diet, and weight control.  Please keep your appointments with your specialists as you may have planned     

## 2017-11-08 NOTE — Progress Notes (Signed)
Subjective:    Patient ID: Anita Schmidt, female    DOB: 22-Oct-1955, 62 y.o.   MRN: [id number]  HPI  Here to f/u-  Has increased anxiety recently she admits exac by recent illness, friend suggested she might have pna or bronchitis coming back which made her more anxsious, and was actually exposed to grandparents with a child with proven flu yesterday.  C/o fatigue bc did not sleep all night due to worry and anxiey.  Has not been using the flonase or inhaler as she worries about side effects, and anxiety causing sob too.  Has done yoga but no recent meds,   though seeing psychiatry, trying to work on exercise helping her stress as well. Declines med tx at this time, while dealing with her mother with parkinsons in independent living.  Father in NT.  Plans to retire soon, bc just no longer able to tolerate the ideas of going to work. dnies SI or HI.   Past Medical History:  Diagnosis Date  . Anxiety    severe  . Anxiety disorder   . Fibrocystic breast disease   . Gilbert's syndrome   . Hemochromatosis   . Hyperlipidemia   . Skin cancer, basal cell    nose   Past Surgical History:  Procedure Laterality Date  . BREAST BIOPSY    . colonscopy  2011   Tics ; Dr Sharlett Iles  . DILATION AND CURETTAGE OF UTERUS    . INGUINAL HERNIA REPAIR    . MOHS SURGERY  2012   nose  . TONSILLECTOMY    . Tracer Pellets  2003   in breast; [place]    reports that she has never smoked. She has never used smokeless tobacco. She reports that she drinks about 1.2 oz of alcohol per week. She reports that she does not use drugs. family history includes Alcohol abuse in her mother; Alzheimer's disease in her maternal grandmother and paternal uncle; Anxiety disorder in her brother; Depression in her brother; Hypertension in her maternal grandmother and mother; Parkinson's disease in her mother; Stroke in her paternal uncle. Allergies  Allergen Reactions  . Latex Itching    Redness & itching Because of a history of  documented adverse serious drug reaction;Medi Alert bracelet  is recommended  . Epinephrine     ? jittery  . Effexor [Venlafaxine]     Shaking, elevated BP,full blown panic attack  Hypertension with systolic blood pressure 762 with severe nausea  . Amoxicillin     REACTION: GI UPSET  . Citalopram     ? nausea   Current Outpatient Medications on File Prior to Visit  Medication Sig Dispense Refill  . benzonatate (TESSALON) 100 MG capsule Take 1 capsule (100 mg total) by mouth every 8 (eight) hours. 21 capsule 0  . fluticasone (FLONASE) 50 MCG/ACT nasal spray Place 1 spray into both nostrils daily. 16 g 2  . levalbuterol (XOPENEX HFA) 45 MCG/ACT inhaler Inhale 2 puffs into the lungs every 6 (six) hours as needed for wheezing. 1 Inhaler 2  . SALINE NASAL SPRAY NA Place 4 sprays into both nostrils 4 (four) times daily.     No current facility-administered medications on file prior to visit.     Review of Systems  Constitutional: Negative for other unusual diaphoresis or sweats HENT: Negative for ear discharge or swelling Eyes: Negative for other worsening visual disturbances Respiratory: Negative for stridor or other swelling  Gastrointestinal: Negative for worsening distension or other blood Genitourinary: Negative for  retention or other urinary change Musculoskeletal: Negative for other MSK pain or swelling Skin: Negative for color change or other new lesions Neurological: Negative for worsening tremors and other numbness  Psychiatric/Behavioral: Negative for worsening agitation or other fatigue All other system neg per pt    Objective:   Physical Exam BP 134/86   Pulse 77   Temp 98.1 F (36.7 C) (Oral)   Ht 5\' 6"  (1.676 m)   Wt 130 lb (59 kg)   LMP 07/19/2008   SpO2 99%   BMI 20.98 kg/m  VS noted, non toxic Constitutional: Pt appears in NAD HENT: Head: NCAT.  Right Ear: External ear normal.  Left Ear: External ear normal.  Eyes: . Pupils are equal, round, and  reactive to light. Conjunctivae and EOM are normal Nose: without d/c or deformity Neck: Neck supple. Gross normal ROM Cardiovascular: Normal rate and regular rhythm.   Pulmonary/Chest: Effort normal and breath sounds without rales or wheezing.  Abd:  Soft, NT, ND, + BS, no organomegaly Neurological: Pt is alert. At baseline orientation, motor grossly intact Skin: Skin is warm. No rashes, other new lesions, no LE edema Psychiatric: Pt behavior is normal without agitation , 2-3+ nervous No other exam findings    Assessment & Plan:

## 2017-11-08 NOTE — Assessment & Plan Note (Signed)
Resolved, pt reassured

## 2017-11-09 LAB — HEPATITIS C ANTIBODY
HEP C AB: NONREACTIVE
SIGNAL TO CUT-OFF: 0.01 (ref <1.00)

## 2017-11-09 LAB — HIV ANTIBODY (ROUTINE TESTING W REFLEX): HIV: NONREACTIVE

## 2017-11-14 ENCOUNTER — Telehealth: Payer: Self-pay

## 2017-11-14 DIAGNOSIS — K760 Fatty (change of) liver, not elsewhere classified: Secondary | ICD-10-CM

## 2017-11-14 NOTE — Telephone Encounter (Signed)
Pt called back. She expressed understanding.  Will go to lab one day this week or next. Order entered

## 2017-11-14 NOTE — Telephone Encounter (Signed)
Called and Lm for pt to call back

## 2017-11-14 NOTE — Telephone Encounter (Signed)
-----   Message from Roetta Sessions, [place] sent at 07/18/2017 11:04 AM EST ----- Regarding: recall ferritin Pt due for recall ferritin end or April or early May

## 2017-11-15 ENCOUNTER — Encounter: Payer: Self-pay | Admitting: Gastroenterology

## 2017-11-17 ENCOUNTER — Other Ambulatory Visit: Payer: Self-pay | Admitting: Gastroenterology

## 2017-11-17 NOTE — Progress Notes (Unsigned)
ferr

## 2017-11-18 ENCOUNTER — Other Ambulatory Visit (INDEPENDENT_AMBULATORY_CARE_PROVIDER_SITE_OTHER): Payer: 59

## 2017-11-18 ENCOUNTER — Encounter: Payer: Self-pay | Admitting: Gastroenterology

## 2017-11-18 LAB — VITAMIN B12: VITAMIN B 12: 328 pg/mL (ref 211–911)

## 2017-11-18 LAB — FERRITIN: FERRITIN: 289.2 ng/mL (ref 10.0–291.0)

## 2017-12-21 ENCOUNTER — Ambulatory Visit: Payer: Self-pay | Admitting: Obstetrics and Gynecology

## 2017-12-23 ENCOUNTER — Encounter: Payer: Self-pay | Admitting: Obstetrics and Gynecology

## 2017-12-23 ENCOUNTER — Ambulatory Visit (INDEPENDENT_AMBULATORY_CARE_PROVIDER_SITE_OTHER): Payer: 59 | Admitting: Obstetrics and Gynecology

## 2017-12-23 ENCOUNTER — Other Ambulatory Visit: Payer: Self-pay | Admitting: Obstetrics and Gynecology

## 2017-12-23 ENCOUNTER — Other Ambulatory Visit: Payer: Self-pay

## 2017-12-23 VITALS — BP 120/68 | HR 72 | Resp 16 | Ht 66.0 in | Wt 129.0 lb

## 2017-12-23 DIAGNOSIS — Z01419 Encounter for gynecological examination (general) (routine) without abnormal findings: Secondary | ICD-10-CM

## 2017-12-23 DIAGNOSIS — Z1231 Encounter for screening mammogram for malignant neoplasm of breast: Secondary | ICD-10-CM

## 2017-12-23 NOTE — Progress Notes (Signed)
62 y.o. [id number] Single Caucasian female here for annual exam.    Having some sinus and allergy issues.   Retiring next week.  Having anxiety.  Not taking medication for this. Sees a therapist.  States long hx of murmur noted through her previous primary care provider.  No prior ECHO done.   PCP: Dr. Cathlean Cower    Patient's last menstrual period was 07/19/2008.           Sexually active: No.  The current method of family planning is post menopausal status.    Exercising: Yes.    yoga and walking Smoker:  no  Health Maintenance: Pap:  12-18-15 Neg:Neg HR HPV History of abnormal Pap:  no MMG:  03/08/16 BIRADS 1 negative/density b  Colonoscopy:  03/16/10 -- normal -- in [facility] BMD:   07/18/17  Result  Osteopenia of spine.  TDaP:  2013 Gardasil:   n/a HIV and Hep C: 11/08/17 Negative Screening Labs: PCP   reports that she has never smoked. She has never used smokeless tobacco. She reports that she drinks about 1.2 oz of alcohol per week. She reports that she does not use drugs.  Past Medical History:  Diagnosis Date  . Anxiety    severe  . Anxiety disorder   . Fibrocystic breast disease   . Gilbert's syndrome   . Hemochromatosis   . Hyperlipidemia   . Skin cancer, basal cell    nose    Past Surgical History:  Procedure Laterality Date  . BREAST BIOPSY    . colonscopy  2011   Tics ; Dr Sharlett Iles  . DILATION AND CURETTAGE OF UTERUS    . INGUINAL HERNIA REPAIR    . MOHS SURGERY  2012   nose  . TONSILLECTOMY    . Tracer Pellets  2003   in breast; [facility]    Current Outpatient Medications  Medication Sig Dispense Refill  . SALINE NASAL SPRAY NA Place 4 sprays into both nostrils 4 (four) times daily.    . fluticasone (FLONASE) 50 MCG/ACT nasal spray Place 1 spray into both nostrils daily. (Patient not taking: Reported on 12/23/2017) 16 g 2   No current facility-administered medications for this visit.     Family History  Problem Relation Age of Onset  . Hypertension  Mother        PMH  . Alcohol abuse Mother   . Parkinson's disease Mother   . Anxiety disorder Brother   . Depression Brother   . Alzheimer's disease Maternal Grandmother   . Hypertension Maternal Grandmother   . Stroke Paternal Uncle   . Alzheimer's disease Paternal Uncle   . Diabetes Neg Hx     Review of Systems  Constitutional: Negative.   HENT: Negative.   Eyes: Negative.   Respiratory: Negative.   Cardiovascular: Negative.   Gastrointestinal: Negative.   Endocrine: Negative.   Genitourinary: Negative.   Musculoskeletal: Negative.   Skin: Negative.   Allergic/Immunologic: Negative.   Neurological: Negative.   Hematological: Negative.   Psychiatric/Behavioral: Negative.     Exam:   BP 120/68 (BP Location: Right Arm, Patient Position: Sitting, Cuff Size: Normal)   Pulse 72   Resp 16   Ht 5' 6"  (1.676 m)   Wt 129 lb (58.5 kg)   LMP 07/19/2008   BMI 20.82 kg/m     General appearance: alert, cooperative and appears stated age Head: Normocephalic, without obvious abnormality, atraumatic Neck: no adenopathy, supple, symmetrical, trachea midline and thyroid normal to inspection and palpation  Lungs: clear to auscultation bilaterally Breasts: normal appearance, no masses or tenderness, No nipple retraction or dimpling, No nipple discharge or bleeding, No axillary or supraclavicular adenopathy Heart: regular rate and rhythm.  III/VI systolic murmur.   Abdomen: soft, non-tender; no masses, no organomegaly Extremities: extremities normal, atraumatic, no cyanosis or edema Skin: Skin color, texture, turgor normal. No rashes or lesions Lymph nodes: Cervical, supraclavicular, and axillary nodes normal. No abnormal inguinal nodes palpated Neurologic: Grossly normal  Pelvic: External genitalia:  no lesions              Urethra:  normal appearing urethra with no masses, tenderness or lesions              Bartholins and Skenes: normal                 Vagina: normal appearing  vagina with normal color and discharge, no lesions              Cervix: no lesions              Pap taken: No. Bimanual Exam:  Uterus:  normal size, contour, position, consistency, mobility, non-tender              Adnexa: no mass, fullness, tenderness              Rectal exam: Yes.  .  Confirms.              Anus:  normal sphincter tone, no lesions  Chaperone was present for exam.  Assessment:   Well woman visit with normal exam. Cardiac murmur.   Osteopenia.   Plan: Mammogram screening.  She will schedule. Recommended self breast awareness. Pap and HR HPV as above. Guidelines for Calcium, Vitamin D, regular exercise program including cardiovascular and weight bearing exercise. I encouraged her to follow up with her PCP RE her murmur.  She may benefit from an ECHO. BMD in 2020. Follow up annually and prn.    After visit summary provided.

## 2017-12-23 NOTE — Patient Instructions (Signed)

## 2017-12-29 ENCOUNTER — Encounter: Payer: Self-pay | Admitting: Obstetrics and Gynecology

## 2017-12-29 ENCOUNTER — Telehealth: Payer: Self-pay | Admitting: Obstetrics and Gynecology

## 2017-12-29 NOTE — Telephone Encounter (Signed)
Spoke with patient. Patient asking if she can schedule breast US at [facility] as alternative to screening MMG. Patient states she would like to avoid radiation as much as possible.   Advised patient  3D MMG is recommended standard for screening MMG at [facility]. Advised patient I am not aware of any additional locations offering [place] only for breast cancer screening. Patient states she will keep [facility] screening as scheduled.    Advised Dr. Quincy Simmonds will review, I will return call with any additional recommendations.  Routing to provider for final review. Patient is agreeable to disposition. Will close encounter.

## 2017-12-29 NOTE — Telephone Encounter (Signed)
Hello Dr. Quincy Simmonds,    Can I change my mammogram to an ultrasound at the [facility]?    Thank you!  [name]

## 2018-01-02 ENCOUNTER — Ambulatory Visit
Admission: RE | Admit: 2018-01-02 | Discharge: 2018-01-02 | Disposition: A | Discharge status: Home / Self Care | Payer: 59 | Source: Ambulatory Visit | Attending: Obstetrics and Gynecology | Admitting: Obstetrics and Gynecology

## 2018-01-02 DIAGNOSIS — Z1231 Encounter for screening mammogram for malignant neoplasm of breast: Secondary | ICD-10-CM

## 2018-02-14 ENCOUNTER — Ambulatory Visit: Payer: 59 | Admitting: Sports Medicine

## 2018-03-15 ENCOUNTER — Other Ambulatory Visit: Payer: Self-pay | Admitting: Physician Assistant

## 2018-04-20 ENCOUNTER — Encounter

## 2018-04-20 ENCOUNTER — Encounter: Payer: Self-pay | Admitting: Sports Medicine

## 2018-04-20 ENCOUNTER — Ambulatory Visit: Payer: Self-pay

## 2018-04-20 ENCOUNTER — Ambulatory Visit (INDEPENDENT_AMBULATORY_CARE_PROVIDER_SITE_OTHER): Payer: 59 | Admitting: Sports Medicine

## 2018-04-20 VITALS — BP 152/84 | Ht 66.0 in | Wt 131.0 lb

## 2018-04-20 DIAGNOSIS — M7632 Iliotibial band syndrome, left leg: Secondary | ICD-10-CM | POA: Insufficient documentation

## 2018-04-20 DIAGNOSIS — M25511 Pain in right shoulder: Secondary | ICD-10-CM

## 2018-04-20 DIAGNOSIS — S46919A Strain of unspecified muscle, fascia and tendon at shoulder and upper arm level, unspecified arm, initial encounter: Secondary | ICD-10-CM | POA: Insufficient documentation

## 2018-04-20 DIAGNOSIS — S46811A Strain of other muscles, fascia and tendons at shoulder and upper arm level, right arm, initial encounter: Secondary | ICD-10-CM

## 2018-04-20 NOTE — Progress Notes (Signed)
Subjective: No chief complaint on file.  HPI: Anita Schmidt is a 62 y.o. presenting to clinic today to discuss the following:  Left Knee "Clicking" Patient states she has no left knee or hip pain but has noticed when she bends her knee or rotates her leg (internally and externally rotates her hip) she hears a "pop". She has no associated weakness in this leg and cannot replicate it today but describes it as a "crackle" that goes from her hip to her knee. She has no known hip or knee arthritis. This has been a stable issue for several weeks.  Right Shoulder Pain Patient states during certain positions during yoga such as downward dog or whenever she raises her arm to her above her shoulder she has been developing shoulder pain. She can still do most of her exercises but has refrained from doing anything on the right that requires her to raise her arm above her shoulder. She states it is also worse if she has to put weight on that arm in a side lying pose. It has been better with rest and avoiding the above mentioned movements.     ROS noted in HPI.   Past Medical, Surgical, Social, and Family History Reviewed & Updated per EMR.   Pertinent Historical Findings include:   Social History   Tobacco Use  Smoking Status Never Smoker  Smokeless Tobacco Never Used   Objective: BP (!) 152/84   Ht 5\' 6"  (1.676 m)   Wt 131 lb (59.4 kg)   LMP 07/19/2008   BMI 21.14 kg/m  Vitals and nursing notes reviewed  Physical Exam Gen: Alert and Oriented x 3, NAD HEENT: Normocephalic, atraumatic MSK: Moves all four extremities; positive O'Brien's test for pain, pain elicited during external rotation against resistance. Negative Hawkins' test, negative crossover test, negative empty can test, negative Yerguson's test. Knee Exam: No crepitus appreciated in knees bilaterally, FROM in extension and flexion, positive Ober's test on the left, abductor and gluteus medius strength 5/5 Ext: no clubbing,  cyanosis, or edema Neuro: No gross deficits, gross sensation intact bilaterally in both upper extremities, 5/5 strength in both UE and LE bilaterally Skin: warm, dry, intact, no rashes  Ultrasound of Right Shoulder  Bicipital tendon shows some calcification but otherwise normal Subscapularis normal Supraspinatus normal Infraspiinatus looks mildly atrophied as does Teres Minor Joint appears normal AC joint appears normal  Impression:  Some atrophy of posterior rotator cuff but no sign of tear or swelling  Ultrasound and interpretation by Wolfgang Phoenix. Bud Kaeser, MD  No results found for this or any previous visit (from the past 72 hour(s)).  Assessment/Plan:  Infraspinatus strain Ultrasound showed some atrophy of the infraspinatus tendon and some mild fluid proximal biceps. Patient given strengthening exercises for infraspinatus tendon (external rotation against resistance and rowing exercise). Will follow up as needed.  It band syndrome, left Patient given two stretches for tight IT band. Patient will do cross over stretch and figure four stretch and return as needed if no improvement but I suspect this will stop her popping symptoms.   PATIENT EDUCATION PROVIDED: See AVS    Diagnosis and plan along with any newly prescribed medication(s) were discussed in detail with this patient today. The patient verbalized understanding and agreed with the plan. Patient advised if symptoms worsen return to clinic or ER.   Health Maintainance:   Orders Placed This Encounter  Procedures  . [place] COMPLETE JOINT SPACE STRUCTURES UP RIGHT    Standing  Status:   Future    Number of Occurrences:   1    Standing Expiration Date:   06/20/2019    Order Specific Question:   Reason for Exam (SYMPTOM  OR DIAGNOSIS REQUIRED)    Answer:   RIGHT SHOULDER PAIN    Order Specific Question:   Preferred imaging location?    Answer:   Internal    No orders of the defined types were placed in this encounter.    Harolyn Rutherford, DO 04/20/2018, 2:16 PM PGY-2 [facility]  I observed and examined the patient with the resident and agree with assessment and plan.  Note reviewed and modified by me. Stefanie Libel, MD

## 2018-04-20 NOTE — Assessment & Plan Note (Signed)
Ultrasound showed some atrophy of the infraspinatus tendon and some mild fluid proximal biceps. Patient given strengthening exercises for infraspinatus tendon (external rotation against resistance and rowing exercise). Will follow up as needed.

## 2018-04-20 NOTE — Patient Instructions (Signed)
It was great to meet you today! Thank you for letting me participate in your care!  Today, we discussed your right shoulder pain and your ultrasound was reassuring that you do not have any signs of arthritis or tendon tear. You did show some symptoms of infraspinatus (it is one of the muscles of the rotator cuff) weakness.   Please do the exercises that we have given you for the shoulder.  Your knee popping is caused by having a tight IT band. Please to the two stretches we gave to you and this should improve.  If you are not better in 6-8 weeks or your symptoms worsen please return to the clinic.  Be well, Harolyn Rutherford, DO PGY-2, [facility]

## 2018-04-20 NOTE — Assessment & Plan Note (Signed)
Patient given two stretches for tight IT band. Patient will do cross over stretch and figure four stretch and return as needed if no improvement but I suspect this will stop her popping symptoms.

## 2018-04-27 ENCOUNTER — Encounter: Payer: Self-pay | Admitting: Internal Medicine

## 2018-04-27 ENCOUNTER — Other Ambulatory Visit: Payer: Self-pay

## 2018-04-27 NOTE — Telephone Encounter (Signed)
Staff to assist pt with making ROV appt please 

## 2018-04-28 ENCOUNTER — Other Ambulatory Visit: Payer: Self-pay

## 2018-05-22 ENCOUNTER — Encounter: Payer: Self-pay | Admitting: Internal Medicine

## 2018-05-23 ENCOUNTER — Other Ambulatory Visit (INDEPENDENT_AMBULATORY_CARE_PROVIDER_SITE_OTHER): Payer: 59

## 2018-05-23 DIAGNOSIS — R739 Hyperglycemia, unspecified: Secondary | ICD-10-CM | POA: Diagnosis not present

## 2018-05-23 DIAGNOSIS — Z0000 Encounter for general adult medical examination without abnormal findings: Secondary | ICD-10-CM

## 2018-05-23 LAB — LIPID PANEL
CHOLESTEROL: 176 mg/dL (ref 0–200)
HDL: 68.6 mg/dL (ref >39.00)
LDL CALC: 97 mg/dL (ref 0–99)
NonHDL: 107.38
TRIGLYCERIDES: 50 mg/dL (ref 0.0–149.0)
Total CHOL/HDL Ratio: 3
VLDL: 10 mg/dL (ref 0.0–40.0)

## 2018-05-23 LAB — URINALYSIS, ROUTINE W REFLEX MICROSCOPIC
BILIRUBIN URINE: NEGATIVE
Hgb urine dipstick: NEGATIVE
KETONES UR: NEGATIVE
LEUKOCYTES UA: NEGATIVE
Nitrite: NEGATIVE
Specific Gravity, Urine: 1.01 (ref 1.000–1.030)
Total Protein, Urine: NEGATIVE
Urine Glucose: NEGATIVE
Urobilinogen, UA: 0.2 (ref 0.0–1.0)
pH: 7 (ref 5.0–8.0)

## 2018-05-23 LAB — FERRITIN: Ferritin: 201.6 ng/mL (ref 10.0–291.0)

## 2018-05-23 LAB — CBC WITH DIFFERENTIAL/PLATELET
BASOS PCT: 0.8 % (ref 0.0–3.0)
Basophils Absolute: 0 10*3/uL (ref 0.0–0.1)
EOS ABS: 0.1 10*3/uL (ref 0.0–0.7)
Eosinophils Relative: 1.3 % (ref 0.0–5.0)
HEMATOCRIT: 44.1 % (ref 36.0–46.0)
HEMOGLOBIN: 15.1 g/dL — AB (ref 12.0–15.0)
Lymphocytes Relative: 43.7 % (ref 12.0–46.0)
Lymphs Abs: 2.2 10*3/uL (ref 0.7–4.0)
MCHC: 34.2 g/dL (ref 30.0–36.0)
MCV: 94.1 fl (ref 78.0–100.0)
MONO ABS: 0.5 10*3/uL (ref 0.1–1.0)
Monocytes Relative: 10.2 % (ref 3.0–12.0)
NEUTROS ABS: 2.2 10*3/uL (ref 1.4–7.7)
Neutrophils Relative %: 44 % (ref 43.0–77.0)
PLATELETS: 286 10*3/uL (ref 150.0–400.0)
RBC: 4.69 Mil/uL (ref 3.87–5.11)
RDW: 12.2 % (ref 11.5–15.5)
WBC: 5 10*3/uL (ref 4.0–10.5)

## 2018-05-23 LAB — BASIC METABOLIC PANEL
BUN: 16 mg/dL (ref 6–23)
CO2: 29 mEq/L (ref 19–32)
Calcium: 9.5 mg/dL (ref 8.4–10.5)
Chloride: 104 mEq/L (ref 96–112)
Creatinine, Ser: 0.8 mg/dL (ref 0.40–1.20)
GFR: 76.74 mL/min (ref >60.00)
GLUCOSE: 114 mg/dL — AB (ref 70–99)
POTASSIUM: 4 meq/L (ref 3.5–5.1)
SODIUM: 140 meq/L (ref 135–145)

## 2018-05-23 LAB — HEPATIC FUNCTION PANEL
ALBUMIN: 4.6 g/dL (ref 3.5–5.2)
ALT: 12 U/L (ref 0–35)
AST: 17 U/L (ref 0–37)
Alkaline Phosphatase: 57 U/L (ref 39–117)
BILIRUBIN TOTAL: 0.8 mg/dL (ref 0.2–1.2)
Bilirubin, Direct: 0.1 mg/dL (ref 0.0–0.3)
Total Protein: 6.8 g/dL (ref 6.0–8.3)

## 2018-05-23 LAB — HEMOGLOBIN A1C: Hgb A1c MFr Bld: 5.8 % (ref 4.6–6.5)

## 2018-05-23 LAB — TSH: TSH: 3.22 u[IU]/mL (ref 0.35–4.50)

## 2018-05-23 NOTE — Progress Notes (Unsigned)
Iron TIBC

## 2018-05-24 LAB — IRON AND TIBC
IRON: 162 ug/dL — AB (ref 27–139)
Iron Saturation: 63 % — ABNORMAL HIGH (ref 15–55)
Total Iron Binding Capacity: 256 ug/dL (ref 250–450)
UIBC: 94 ug/dL — ABNORMAL LOW (ref 118–369)

## 2018-06-06 ENCOUNTER — Ambulatory Visit: Payer: 59 | Admitting: Sports Medicine

## 2018-06-07 ENCOUNTER — Ambulatory Visit: Payer: 59 | Admitting: Internal Medicine

## 2018-06-09 ENCOUNTER — Ambulatory Visit: Payer: 59 | Admitting: Internal Medicine

## 2018-07-03 ENCOUNTER — Ambulatory Visit (INDEPENDENT_AMBULATORY_CARE_PROVIDER_SITE_OTHER): Payer: 59 | Admitting: Internal Medicine

## 2018-07-03 ENCOUNTER — Encounter: Payer: Self-pay | Admitting: Internal Medicine

## 2018-07-03 VITALS — BP 146/94 | HR 70 | Temp 98.4°F | Ht 66.0 in | Wt 134.0 lb

## 2018-07-03 DIAGNOSIS — R030 Elevated blood-pressure reading, without diagnosis of hypertension: Secondary | ICD-10-CM | POA: Diagnosis not present

## 2018-07-03 DIAGNOSIS — R011 Cardiac murmur, unspecified: Secondary | ICD-10-CM

## 2018-07-03 DIAGNOSIS — Z0001 Encounter for general adult medical examination with abnormal findings: Secondary | ICD-10-CM

## 2018-07-03 DIAGNOSIS — R9431 Abnormal electrocardiogram [ECG] [EKG]: Secondary | ICD-10-CM | POA: Insufficient documentation

## 2018-07-03 DIAGNOSIS — R739 Hyperglycemia, unspecified: Secondary | ICD-10-CM

## 2018-07-03 DIAGNOSIS — Z0000 Encounter for general adult medical examination without abnormal findings: Secondary | ICD-10-CM

## 2018-07-03 NOTE — Assessment & Plan Note (Addendum)
asympt without dizzy or sob or pain; ok for echo as seems new  In addition to the time spent performing CPE, I spent an additional 25 minutes face to face,in which greater than 50% of this time was spent in counseling and coordination of care for patient's acute illness as documented, including the differential dx, treatment, further evaluation and other management of cardiac murmur, abnormal ECG, elevated BP, hyperglycemia and hemochromatosis

## 2018-07-03 NOTE — Assessment & Plan Note (Signed)

## 2018-07-03 NOTE — Assessment & Plan Note (Signed)
?   Need for antihtn tx, pt not inclined for now, will continue to monitor BP at home

## 2018-07-03 NOTE — Patient Instructions (Signed)
Please check your Blood pressure on a regular basis, as the goal is to be less than 130/80 (or at least 140/90  Please continue all other medications as before, and refills have been done if requested.  Please have the pharmacy call with any other refills you may need.  Please continue your efforts at being more active, low cholesterol diet, and weight control.  You are otherwise up to date with prevention measures today.  Please keep your appointments with your specialists as you may have planned - Dr Havery Moros for the hemochromatosis, and counseling as you do  You will be contacted regarding the referral for: Echocardiogram  Please return in 1 year for your yearly visit, or sooner if needed, with Lab testing done 3-5 days before  .

## 2018-07-03 NOTE — Assessment & Plan Note (Addendum)
For echo r/o LVH or other, suspect could be related to a cardiac hemochromatosis involvement?

## 2018-07-03 NOTE — Assessment & Plan Note (Signed)
stable overall by history and exam, recent data reviewed with pt, and pt to continue medical treatment as before,  to f/u any worsening symptoms or concerns  

## 2018-07-03 NOTE — Assessment & Plan Note (Signed)
Also to continue f/u with GI

## 2018-07-03 NOTE — Progress Notes (Signed)
Subjective:    Patient ID: Anita Schmidt, female    DOB: [birth year], 62 y.o.   MRN: [id number]  HPI  Here for wellness and f/u;  Overall doing ok;  Pt denies Chest pain, worsening SOB, DOE, wheezing, orthopnea, PND, worsening LE edema, palpitations, dizziness or syncope.  Pt denies neurological change such as new headache, facial or extremity weakness.  Pt denies polydipsia, polyuria, or low sugar symptoms. Pt states overall good compliance with treatment and medications, good tolerability, and has been trying to follow appropriate diet.  Pt denies worsening depressive symptoms, suicidal ideation or panic. No fever, night sweats, wt loss, loss of appetite, or other constitutional symptoms.  Pt states good ability with ADL's, has low fall risk, home safety reviewed and adequate, no other significant changes in hearing or vision, and only occasionally active with exercise.  Retired since Feb 09, 2018, mother died with PD and SBO/aspirate pna recently.  Has been grieving but now better. Still sees counesling once monthly for anxiety as before.   Bp is increased with anixety at home, but settles down with relaxation.  Had heart murmur noted at GYN, last ECG at ED visit April 2019 with NSR ? LVH, pt asks for echo. Past Medical History:  Diagnosis Date  . Anxiety    severe  . Anxiety disorder   . Fibrocystic breast disease   . Gilbert's syndrome   . Hemochromatosis   . Hyperlipidemia   . Skin cancer, basal cell    nose   Past Surgical History:  Procedure Laterality Date  . BREAST BIOPSY    . colonscopy  2011   Tics ; Dr Sharlett Iles  . DILATION AND CURETTAGE OF UTERUS    . INGUINAL HERNIA REPAIR    . MOHS SURGERY  2012   nose  . TONSILLECTOMY    . Tracer Pellets  2003   in breast; [place] [facility]    reports that she has never smoked. She has never used smokeless tobacco. She reports current alcohol use of about 2.0 standard drinks of alcohol per week. She reports that she does not use drugs. family  history includes Alcohol abuse in her mother; Alzheimer's disease in her maternal grandmother and paternal uncle; Anxiety disorder in her brother; Depression in her brother; Hypertension in her maternal grandmother and mother; Parkinson's disease in her mother; Stroke in her paternal uncle. Allergies  Allergen Reactions  . Latex Itching    Redness & itching Because of a history of documented adverse serious drug reaction;Medi Alert bracelet  is recommended  . Epinephrine     ? jittery  . Effexor [Venlafaxine]     Shaking, elevated BP,full blown panic attack  Hypertension with systolic blood pressure 174 with severe nausea  . Amoxicillin     REACTION: GI UPSET  . Citalopram     ? nausea   No current outpatient medications on file prior to visit.   No current facility-administered medications on file prior to visit.    ROS:   VS noted,  Constitutional: Pt is oriented to person, place, and time. Appears well-developed and well-nourished, in no significant distress and comfortable Head: Normocephalic and atraumatic  Eyes: Conjunctivae and EOM are normal. Pupils are equal, round, and reactive to light Right Ear: External ear normal without discharge Left Ear: External ear normal without discharge Nose: Nose without discharge or deformity Mouth/Throat: Oropharynx is without other ulcerations and moist  Neck: Normal range of motion. Neck supple. No JVD present. No tracheal deviation present or  significant neck LA or mass Cardiovascular: Normal rate, regular rhythm, normal heart sounds and intact distal pulses.   Pulmonary/Chest: WOB normal and breath sounds without rales or wheezing  Abdominal: Soft. Bowel sounds are normal. NT. No HSM  Musculoskeletal: Normal range of motion. Exhibits no edema Lymphadenopathy: Has no other cervical adenopathy.  Neurological: Pt is alert and oriented to person, place, and time. Pt has normal reflexes. No cranial nerve deficit. Motor grossly intact, Gait  intact Skin: Skin is warm and dry. No rash noted or new ulcerations Psychiatric:  Has normal mood and affect. Behavior is normal without agitation All other system neg per pt    Objective:   Physical Exam BP (!) 146/94   Pulse 70   Temp 98.4 F (36.9 C) (Oral)   Ht 5' 6"  (1.676 m)   Wt 134 lb (60.8 kg)   LMP 07/19/2008   SpO2 97%   BMI 21.63 kg/m  VS noted,  Constitutional: Pt is oriented to person, place, and time. Appears well-developed and well-nourished, in no significant distress and comfortable Head: Normocephalic and atraumatic  Eyes: Conjunctivae and EOM are normal. Pupils are equal, round, and reactive to light Right Ear: External ear normal without discharge Left Ear: External ear normal without discharge Nose: Nose without discharge or deformity Mouth/Throat: Oropharynx is without other ulcerations and moist  Neck: Normal range of motion. Neck supple. No JVD present. No tracheal deviation present or significant neck LA or mass Cardiovascular: Normal rate, regular rhythm, normal heart sounds and intact distal pulses except for sot gr 2/6 RUSB   Pulmonary/Chest: WOB normal and breath sounds without rales or wheezing  Abdominal: Soft. Bowel sounds are normal. NT. No HSM  Musculoskeletal: Normal range of motion. Exhibits no edema Lymphadenopathy: Has no other cervical adenopathy.  Neurological: Pt is alert and oriented to person, place, and time. Pt has normal reflexes. No cranial nerve deficit. Motor grossly intact, Gait intact Skin: Skin is warm and dry. No rash noted or new ulcerations Psychiatric:  Has normal mood and affect. Behavior is normal without agitation No other exam findings Lab Results  Component Value Date   WBC 5.0 05/23/2018   HGB 15.1 (H) 05/23/2018   HCT 44.1 05/23/2018   PLT 286.0 05/23/2018   GLUCOSE 114 (H) 05/23/2018   CHOL 176 05/23/2018   TRIG 50.0 05/23/2018   HDL 68.60 05/23/2018   LDLDIRECT 131.8 08/01/2008   LDLCALC 97 05/23/2018    ALT 12 05/23/2018   AST 17 05/23/2018   NA 140 05/23/2018   K 4.0 05/23/2018   CL 104 05/23/2018   CREATININE 0.80 05/23/2018   BUN 16 05/23/2018   CO2 29 05/23/2018   TSH 3.22 05/23/2018   HGBA1C 5.8 05/23/2018      Assessment & Plan:

## 2018-07-07 ENCOUNTER — Other Ambulatory Visit: Payer: Self-pay

## 2018-07-07 ENCOUNTER — Ambulatory Visit (HOSPITAL_COMMUNITY): Payer: 59 | Attending: Internal Medicine

## 2018-07-07 DIAGNOSIS — R011 Cardiac murmur, unspecified: Secondary | ICD-10-CM | POA: Insufficient documentation

## 2018-07-07 DIAGNOSIS — R9431 Abnormal electrocardiogram [ECG] [EKG]: Secondary | ICD-10-CM | POA: Diagnosis present

## 2018-12-01 ENCOUNTER — Telehealth: Payer: Self-pay | Admitting: Gastroenterology

## 2018-12-01 ENCOUNTER — Other Ambulatory Visit: Payer: Self-pay | Admitting: Gastroenterology

## 2018-12-01 NOTE — Telephone Encounter (Signed)
Pt requested order placed for her to get blood test.

## 2018-12-01 NOTE — Telephone Encounter (Signed)
Informed patient I will put her lab orders in for Iron levels and she come to the lab any time next week. Patient verbalized understanding.

## 2018-12-25 ENCOUNTER — Encounter: Payer: Self-pay | Admitting: Internal Medicine

## 2018-12-26 ENCOUNTER — Other Ambulatory Visit (INDEPENDENT_AMBULATORY_CARE_PROVIDER_SITE_OTHER): Payer: 59

## 2018-12-26 LAB — IBC + FERRITIN
Ferritin: 183.2 ng/mL (ref 10.0–291.0)
Iron: 183 ug/dL — ABNORMAL HIGH (ref 42–145)
Saturation Ratios: 72.2 % — ABNORMAL HIGH (ref 20.0–50.0)
Transferrin: 181 mg/dL — ABNORMAL LOW (ref 212.0–360.0)

## 2018-12-27 ENCOUNTER — Ambulatory Visit: Payer: 59 | Admitting: Obstetrics and Gynecology

## 2018-12-27 ENCOUNTER — Other Ambulatory Visit: Payer: Self-pay

## 2019-01-17 ENCOUNTER — Other Ambulatory Visit (INDEPENDENT_AMBULATORY_CARE_PROVIDER_SITE_OTHER): Payer: 59

## 2019-01-17 LAB — IBC + FERRITIN
Ferritin: 198.7 ng/mL (ref 10.0–291.0)
Iron: 188 ug/dL — ABNORMAL HIGH (ref 42–145)
Saturation Ratios: 68.5 % — ABNORMAL HIGH (ref 20.0–50.0)
Transferrin: 196 mg/dL — ABNORMAL LOW (ref 212.0–360.0)

## 2019-01-22 NOTE — Progress Notes (Signed)
Prescreeend pt for 7-7 appt

## 2019-01-23 ENCOUNTER — Other Ambulatory Visit: Payer: Self-pay

## 2019-01-23 ENCOUNTER — Encounter: Payer: Self-pay | Admitting: Gastroenterology

## 2019-01-23 ENCOUNTER — Ambulatory Visit (INDEPENDENT_AMBULATORY_CARE_PROVIDER_SITE_OTHER): Payer: 59 | Admitting: Gastroenterology

## 2019-01-23 ENCOUNTER — Other Ambulatory Visit: Payer: Self-pay | Admitting: Gastroenterology

## 2019-01-23 DIAGNOSIS — K50919 Crohn's disease, unspecified, with unspecified complications: Secondary | ICD-10-CM

## 2019-01-23 NOTE — Progress Notes (Signed)
Virtual Visit via Video Note  I connected with Anita Schmidt on 01/23/19 at  3:00 PM EDT by a video enabled telemedicine application and verified that I am speaking with the correct person using two identifiers.  I discussed the limitations of evaluation and management by telemedicine and the availability of in person appointments. The patient expressed understanding and agreed to proceed.  THIS ENCOUNTER IS A VIRTUAL VISIT DUE TO COVID-19 - PATIENT WAS NOT SEEN IN THE OFFICE. PATIENT HAS CONSENTED TO VIRTUAL VISIT / TELEMEDICINE VISIT USING DOXIMITY APP   Location of patient: home Location of provider: office Persons participating: myself, patient Time spent on call:  22 minutes   HPI :  63 y/o femalewith history of hereditary hemochromatosis, here for a follow up visit. 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 Historically her ferritin levels have been <500 and ALT level has been normal.   She was diagnosed > 10 years ago. She has not had any chronic phlebotomy for this..No history of DM, thyroid abnormalities, or skin issues. She denies any FH of cirrhosis or liver disease. She does not have any children. She previously drank a bottle of wine per night for 3-5 years, but more recently has had 1-2 glasses per night. She denies any vitamin C supplements or high intake of vitamin C.  05/13/17: Iron level of 130 Iron sat 47.9 % Ferritin of 253 (rose from prior values of 180-190s)  More recently she had ferritin and TIBC checked 2 times (not sure why), Ferritin between 183 and 198 but her iron sat has intervally increased between 68.5 and 72.2%  She had an echocardiogram since I have last seen her in 06/2018, EF 65-70%, she does not have any known cardiac involvement of known liver disease. She is post-menopausal. No jaundice, no LE edema, no abdominal pains. She generally feels well without any complains. No problems with her bowels or blood in the stools.   Colonoscopy 03/16/2010 - severe diverticulosis   DEXA 07/23/2017 - t score (-) 1.1. She drinks milk daily, calcium supplement causes nausea  Echocardiogram 06/2018 -  EF 65-70%   Past Medical History:  Diagnosis Date  . Anxiety    severe  . Anxiety disorder   . Fibrocystic breast disease   . Gilbert's syndrome   . Hemochromatosis   . Hyperlipidemia   . Skin cancer, basal cell    nose     Past Surgical History:  Procedure Laterality Date  . BREAST BIOPSY    . colonscopy  2011   Tics ; Dr Sharlett Iles  . DILATION AND CURETTAGE OF UTERUS    . INGUINAL HERNIA REPAIR    . MOHS SURGERY  2012   nose  . TONSILLECTOMY    . Tracer Pellets  2003   in breast; [place]   Family History  Problem Relation Age of Onset  . Hypertension Mother        PMH  . Alcohol abuse Mother   . Parkinson's disease Mother   . Anxiety disorder Brother   . Depression Brother   . Alzheimer's disease Maternal Grandmother   . Hypertension Maternal Grandmother   . Stroke Paternal Uncle   . Alzheimer's disease Paternal Uncle   . Diabetes Neg Hx   . Colon cancer Neg Hx   . Esophageal cancer Neg Hx    Social History   Tobacco Use  . Smoking status: Never Smoker  . Smokeless tobacco: Never Used  Substance Use Topics  . Alcohol use: Yes  Alcohol/week: 2.0 standard drinks    Types: 2 Glasses of wine per week  . Drug use: No   No current outpatient medications on file.   No current facility-administered medications for this visit.    Allergies  Allergen Reactions  . Latex Itching    Redness & itching Because of a history of documented adverse serious drug reaction;Medi Alert bracelet  is recommended  . Epinephrine     ? jittery  . Effexor [Venlafaxine]     Shaking, elevated BP,full blown panic attack  Hypertension with systolic blood pressure 334 with severe nausea  . Amoxicillin     REACTION: GI UPSET  . Citalopram     ? nausea     Review of Systems: All systems reviewed and negative  except where noted in HPI.   Lab Results  Component Value Date   IRON 188 (H) 01/17/2019   TIBC 256 05/23/2018   FERRITIN 198.7 01/17/2019    Lab Results  Component Value Date   ALT 12 05/23/2018   AST 17 05/23/2018   ALKPHOS 57 05/23/2018   BILITOT 0.8 05/23/2018    Lab Results  Component Value Date   CREATININE 0.80 05/23/2018   BUN 16 05/23/2018   NA 140 05/23/2018   K 4.0 05/23/2018   CL 104 05/23/2018   CO2 29 05/23/2018   Lab Results  Component Value Date   WBC 5.0 05/23/2018   HGB 15.1 (H) 05/23/2018   HCT 44.1 05/23/2018   MCV 94.1 05/23/2018   PLT 286.0 05/23/2018      Physical Exam: Ht _0  (1.676 m) Comment: pt provided over the phone  Wt 131 lb (59.4 kg) Comment: pt provided over the phone  LMP 07/19/2008   BMI 21.14 kg/m  Constitutional: Pleasant,well-developed, female in no acute distress. Psychiatric: Normal mood and affect. Behavior is normal.   ASSESSMENT AND PLAN: 63 y/o female here for reassessment of the following:  Hereditary Hemochromatosis - c282Y homozygous mutation, never required phlebotomy. She has no evidence of liver inflammation / cirrhosis, cardiac involvement, no diabetes or thyroid involvement. Guidelines recommend monitoring with ALT normal, ferritin < 500 and transferrin saturation < 60%. She had met all of these criteria for continued observation however over time her transferrin saturation has increased to high 60s -low 70s%. Discussed options with her. I am going to refer her to Hematology for their opinion on phlebotomy given change in transferrin saturation over the past few years. I reassured her she does not have end organ involvement that we can tell at this time. I do think she should minimize her alcohol use and she is at risk for alcohol related liver disease with her current usage, she needs to decrease, she understood.   [place] Cellar, MD [facility]

## 2019-01-23 NOTE — Patient Instructions (Addendum)
If you are age 65 or older, your body mass index should be between 23-30. Your Body mass index is 21.14 kg/m. If this is out of the aforementioned range listed, please consider follow up with your Primary Care Provider.  If you are age 4 or younger, your body mass index should be between 19-25. Your Body mass index is 21.14 kg/m. If this is out of the aformentioned range listed, please consider follow up with your Primary Care Provider.   To help prevent the possible spread of infection to our patients, communities, and staff; we will be implementing the following measures:  As of now we are not allowing any visitors/family members to accompany you to any upcoming appointments with [facility]. If you have any concerns about this please contact our office to discuss prior to the appointment.   We will refer you to Hematology.  They will contact you to schedule an appointment.  Please let us know if you have not heard from them within a week or two.  Thank you for entrusting me with your care and for choosing [facility], Dr. Kirkman Cellar

## 2019-01-25 ENCOUNTER — Telehealth: Payer: Self-pay | Admitting: Hematology

## 2019-01-25 NOTE — Telephone Encounter (Signed)
Spoke with patient re new patient appointment with [name]. Confirmed date/time/location/demographics.

## 2019-01-26 ENCOUNTER — Other Ambulatory Visit: Payer: Self-pay

## 2019-01-29 NOTE — Progress Notes (Signed)
[place]   Telephone:([phone]) [phone] Fax:([phone]) [place] Note   Patient Care Team: Biagio Borg, MD as PCP - General (Internal Medicine)  Date of Service:  01/30/2019   CHIEF COMPLAINTS/PURPOSE OF CONSULTATION:  Hereditary Hemochromatosis, (+) HFE C282Y homozygous mutation  REFERRING PHYSICIAN:  Dr. Havery Moros   HISTORY OF PRESENTING ILLNESS:  Anita Schmidt 63 y.o. female is a here because of Hereditary Hemochromatosis. The patient was referred by her GI Dr. Havery Moros. The patient presents to the clinic today alone.   She was diagnosed with this 10 years ago by Dr. Sharlett Iles who was her GI then, genetic testing was positive for C282Y homozygous mutation in HFE gene, per GI notes. She had not seen a hematologist for this. She was referred to me now due to her increased iron levels. She has not had phlebotomy before.   She does not have family members with this mutation. She tries to get her brother to get tested.    She is clinically doing very well, denies any abdominal discomfort or other symptoms. She notes she does not eat red meat. She eats fruits and veggies and is very active. She does not smoke. She did drink some alcohol. She has decreased to wine and non-alcoholic beer. She does not have family in town.  They have a PMHx of Anxiety with White coat syndrome. She notes she does yoga, not on medication.  She otherwise has no other PMH.    REVIEW OF SYSTEMS:   Constitutional: Denies fevers, chills or abnormal night sweats Eyes: Denies blurriness of vision, double vision or watery eyes Ears, nose, mouth, throat, and face: Denies mucositis or sore throat Respiratory: Denies cough, dyspnea or wheezes Cardiovascular: Denies palpitation, chest discomfort or lower extremity swelling Gastrointestinal:  Denies nausea, heartburn or change in bowel habits Skin: Denies abnormal skin rashes Lymphatics: Denies new lymphadenopathy or easy  bruising Neurological:Denies numbness, tingling or new weaknesses Behavioral/Psych: Mood is stable, no new changes  All other systems were reviewed with the patient and are negative.   MEDICAL HISTORY:  Past Medical History:  Diagnosis Date   Anxiety    severe   Anxiety disorder    Fibrocystic breast disease    Gilbert's syndrome    Hemochromatosis    Hyperlipidemia    Skin cancer, basal cell    nose    SURGICAL HISTORY: Past Surgical History:  Procedure Laterality Date   BREAST BIOPSY     colonscopy  2011   Tics ; Dr Sharlett Iles   DILATION AND CURETTAGE OF UTERUS     INGUINAL HERNIA REPAIR     MOHS SURGERY  2012   nose   TONSILLECTOMY     Tracer Pellets  2003   in breast; [facility]    SOCIAL HISTORY: Social History   Socioeconomic History   Marital status: Single    Spouse name: Not on file   Number of children: Not on file   Years of education: Not on file   Highest education level: Not on file  Occupational History   Occupation: TEXTILE DESIGNER    Employer: [employer]  Social Needs   Financial resource strain: Not on file   Food insecurity    Worry: Not on file    Inability: Not on file   Transportation needs    Medical: Not on file    Non-medical: Not on file  Tobacco Use   Smoking status: Never Smoker   Smokeless tobacco: Never Used  Substance and Sexual Activity   Alcohol use: Yes    Alcohol/week: 2.0 standard drinks    Types: 2 Glasses of wine per week   Drug use: No   Sexual activity: Not Currently    Partners: Male    Birth control/protection: Post-menopausal  Lifestyle   Physical activity    Days per week: Not on file    Minutes per session: Not on file   Stress: Not on file  Relationships   Social connections    Talks on phone: Not on file    Gets together: Not on file    Attends religious service: Not on file    Active member of club or organization: Not on file    Attends meetings of clubs or organizations:  Not on file    Relationship status: Not on file   Intimate partner violence    Fear of current or ex partner: Not on file    Emotionally abused: Not on file    Physically abused: Not on file    Forced sexual activity: Not on file  Other Topics Concern   Not on file  Social History Narrative   GETS REGULAR EXERCISE          FAMILY HISTORY: Family History  Problem Relation Age of Onset   Hypertension Mother        PMH   Alcohol abuse Mother    Parkinson's disease Mother    Anxiety disorder Brother    Depression Brother    Alzheimer's disease Maternal Grandmother    Hypertension Maternal Grandmother    Stroke Paternal Uncle    Alzheimer's disease Paternal Uncle    Diabetes Neg Hx    Colon cancer Neg Hx    Esophageal cancer Neg Hx     ALLERGIES:  is allergic to latex; epinephrine; effexor [venlafaxine]; amoxicillin; and citalopram.  MEDICATIONS:  No current outpatient medications on file.   No current facility-administered medications for this visit.     PHYSICAL EXAMINATION: ECOG PERFORMANCE STATUS: 0 - Asymptomatic  Vitals:   01/30/19 1154  BP: (!) 192/98  Pulse: 79  Resp: 18  Temp: 98.7 F (37.1 C)  SpO2: 100%   Filed Weights   01/30/19 1154  Weight: 131 lb 6.4 oz (59.6 kg)    GENERAL:alert, no distress and comfortable SKIN: skin color, texture, turgor are normal, no rashes or significant lesions EYES: normal, Conjunctiva are pink and non-injected, sclera clear  NECK: supple, thyroid normal size, non-tender, without nodularity LYMPH:  no palpable lymphadenopathy in the cervical, axillary  LUNGS: clear to auscultation and percussion with normal breathing effort HEART: regular rate & rhythm and no lower extremity edema (+) very mild systolic heart murmur ABDOMEN:abdomen soft, non-tender and normal bowel sounds No hepatomegaly  Musculoskeletal:no cyanosis of digits and no clubbing  NEURO: alert & oriented x 3 with fluent speech, no focal  motor/sensory deficits  LABORATORY DATA:  I have reviewed the data as listed CBC Latest Ref Rng & Units 05/23/2018 04/07/2017 03/11/2016  WBC 4.0 - 10.5 K/uL 5.0 5.8 5.2  Hemoglobin 12.0 - 15.0 g/dL 15.1(H) 14.7 14.6  Hematocrit 36.0 - 46.0 % 44.1 43.5 42.0  Platelets 150.0 - 400.0 K/uL 286.0 268.0 338.0    CMP Latest Ref Rng & Units 05/23/2018 06/24/2017 04/07/2017  Glucose 70 - 99 mg/dL 114(H) - 97  BUN 6 - 23 mg/dL 16 - 14  Creatinine 0.40 - 1.20 mg/dL 0.80 - 0.75  Sodium 135 - 145 mEq/L 140 - 141  Potassium 3.5 - 5.1 mEq/L 4.0 - 3.9  Chloride 96 - 112 mEq/L 104 - 106  CO2 19 - 32 mEq/L 29 - 23  Calcium 8.4 - 10.5 mg/dL 9.5 - 9.7  Total Protein 6.0 - 8.3 g/dL 6.8 6.3 6.9  Total Bilirubin 0.2 - 1.2 mg/dL 0.8 0.8 0.7  Alkaline Phos 39 - 117 U/L 57 54 49  AST 0 - 37 U/L 17 15 15   ALT 0 - 35 U/L 12 10 11      RADIOGRAPHIC STUDIES: I have personally reviewed the radiological images as listed and agreed with the findings in the report. No results found.  ASSESSMENT & PLAN:  Anita Schmidt is a 63 y.o. Caucasian female without significant PMH.    1. 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 She has been foolowed by her GI only. She has not had phlebotomy before. Her ferritin has been in the range of 150-290, iron saturation 60-72%, slightly higher lately  -Her US abdomen in 2017 shows normal spleen and fatty liver, otherwise negative. I do not see liver MRI or biopsy has been done before -I recommend MRI liver to evaluate if there is evidence of iron overload in liver. Given her anxiety it would have to be open MRI if she can tolerate it. She is agreeable to try.  -Due to elevated iron saturation, I recommend she start monthly phlebotomies to lower her iron level. If liver MRI is negative for iron overload, I will be much aggressive on phlebotomy.  She is agreeable. I recommend she make sure she drink plenty of water and eat  before phlebotomy. Will start with half unit for first time. -I will set her Ferritin goal based on MRI and response to phlebotomy.  -I reviewed diet that is low in iron, red meat and to increase vegetables.  -Exam today benign, no hepatomegaly, very mild heart murmur.  -F/u with 2nd phlebotomy.   2. Anxiety -She also has white coat syndrome, anxiety of hospitals and claustrophobia.  -Not on medication but does yoga often  -06/2018 ECHO shows EF 65-70%, mild heart murmur   PLAN:  -Liver MRI in 2-3 weeks -Phlebotomy next week with half unit  -Lab, f/u and phlebotomy in 5 weeks   Orders Placed This Encounter  Procedures   MR Abdomen Wo Contrast    To evaluate evidence of iron overload in liver    Standing Status:   Future    Standing Expiration Date:   01/31/2020    Order Specific Question:   What is the patient's sedation requirement?    Answer:   No Sedation    Order Specific Question:   Does the patient have a pacemaker or implanted devices?    Answer:   No    Order Specific Question:   Preferred imaging location?    Answer:   [facility] (table limit-350 lbs)    Order Specific Question:   Radiology Contrast Protocol - do NOT remove file path    Answer:   [email or web address]    All questions were answered. The patient knows to call the clinic with any problems, questions or concerns. I spent 35 minutes counseling the patient face to face. The total time spent in the appointment was 45 minutes and more than 50% was on counseling.     Truitt Merle, MD 01/30/2019   I, Joslyn Devon, am acting as scribe for Truitt Merle, MD.   I have reviewed  the above documentation for accuracy and completeness, and I agree with the above.

## 2019-01-30 ENCOUNTER — Other Ambulatory Visit: Payer: Self-pay

## 2019-01-30 ENCOUNTER — Ambulatory Visit: Payer: 59 | Admitting: Obstetrics and Gynecology

## 2019-01-30 ENCOUNTER — Encounter: Payer: Self-pay | Admitting: Obstetrics and Gynecology

## 2019-01-30 ENCOUNTER — Inpatient Hospital Stay: Payer: 59 | Attending: Hematology | Admitting: Hematology

## 2019-01-30 ENCOUNTER — Telehealth: Payer: Self-pay | Admitting: Hematology

## 2019-01-30 ENCOUNTER — Other Ambulatory Visit (HOSPITAL_COMMUNITY)
Admission: RE | Admit: 2019-01-30 | Discharge: 2019-01-30 | Disposition: A | Discharge status: Home / Self Care | Payer: 59 | Source: Ambulatory Visit | Attending: Obstetrics and Gynecology | Admitting: Obstetrics and Gynecology

## 2019-01-30 ENCOUNTER — Encounter: Payer: Self-pay | Admitting: Hematology

## 2019-01-30 ENCOUNTER — Ambulatory Visit (INDEPENDENT_AMBULATORY_CARE_PROVIDER_SITE_OTHER): Payer: 59 | Admitting: Obstetrics and Gynecology

## 2019-01-30 VITALS — BP 140/88 | HR 76 | Temp 97.6°F | Resp 12 | Ht 66.0 in | Wt 130.0 lb

## 2019-01-30 DIAGNOSIS — Z01419 Encounter for gynecological examination (general) (routine) without abnormal findings: Secondary | ICD-10-CM

## 2019-01-30 NOTE — Progress Notes (Signed)
63 y.o. G22P0020 Single Caucasian female here for annual exam.    Mother passed 29 days after she retired.  Some anxiety.  Sees a therapist.   She will need to do an MRI to check her pancreas.   Has hemochromatosis and will start phlebotomy.  [name] caring for her.  She did have an echocardiogram last year.   She stopped drinking ETOH.   PCP: Cathlean Cower, MD    Patient's last menstrual period was 07/19/2008.           Sexually active: No.  The current method of family planning is post menopausal status.    Exercising: Yes.    walking and yoga Smoker:  no  Health Maintenance: Pap:  12/18/15 Neg:Neg HR HPV History of abnormal Pap:  no MMG:  01/02/18 BIRADS 1 negative/density b.  She will schedule.  Colonoscopy:  03/16/10 -- normal  BMD:   07/18/17  Result  Osteopenia of spine TDaP:  2013 HIV and Hep C: 11/08/17 Negative Screening Labs:  PCP   reports that she has never smoked. She has never used smokeless tobacco. She reports current alcohol use of about 2.0 standard drinks of alcohol per week. She reports that she does not use drugs.  Past Medical History:  Diagnosis Date  . Anxiety    severe  . Anxiety disorder   . Fibrocystic breast disease   . Gilbert's syndrome   . Hemochromatosis   . Hyperlipidemia   . Skin cancer, basal cell    nose    Past Surgical History:  Procedure Laterality Date  . BREAST BIOPSY    . colonscopy  2011   Tics ; Dr Sharlett Iles  . DILATION AND CURETTAGE OF UTERUS    . INGUINAL HERNIA REPAIR    . MOHS SURGERY  2012   nose  . TONSILLECTOMY    . Tracer Pellets  2003   in breast; [facility]    No current outpatient medications on file.   No current facility-administered medications for this visit.     Family History  Problem Relation Age of Onset  . Hypertension Mother        PMH  . Alcohol abuse Mother   . Parkinson's disease Mother   . Anxiety disorder Brother   . Depression Brother   . Alzheimer's disease Maternal Grandmother    . Hypertension Maternal Grandmother   . Stroke Paternal Uncle   . Alzheimer's disease Paternal Uncle   . Diabetes Neg Hx   . Colon cancer Neg Hx   . Esophageal cancer Neg Hx     Review of Systems  Constitutional: Negative.   HENT: Negative.   Eyes: Negative.   Respiratory: Negative.   Cardiovascular: Negative.   Gastrointestinal: Negative.   Endocrine: Negative.   Genitourinary: Negative.   Musculoskeletal: Negative.   Skin: Negative.   Allergic/Immunologic: Negative.   Neurological: Negative.   Hematological: Negative.   Psychiatric/Behavioral: Negative.     Exam:   BP 140/88 (BP Location: Right Arm, Patient Position: Sitting, Cuff Size: Normal)   Pulse 76   Temp 97.6 F (36.4 C) (Temporal)   Resp 12   Ht 5' 6"  (1.676 m)   Wt 130 lb (59 kg)   LMP 07/19/2008   BMI 20.98 kg/m     General appearance: alert, cooperative and appears stated age Head: normocephalic, without obvious abnormality, atraumatic Neck: no adenopathy, supple, symmetrical, trachea midline and thyroid normal to inspection and palpation Lungs: clear to auscultation bilaterally Breasts: normal appearance,  no masses or tenderness, No nipple retraction or dimpling, No nipple discharge or bleeding, No axillary adenopathy Heart: regular rate and rhythm.  II/VI SEM. Abdomen: soft, non-tender; no masses, no organomegaly Extremities: extremities normal, atraumatic, no cyanosis or edema Skin: skin color, texture, turgor normal. No rashes or lesions Lymph nodes: cervical, supraclavicular, and axillary nodes normal. Neurologic: grossly normal  Pelvic: External genitalia:  no lesions              No abnormal inguinal nodes palpated.              Urethra:  normal appearing urethra with no masses, tenderness or lesions              Bartholins and Skenes: normal                 Vagina: normal appearing vagina with normal color and discharge, no lesions              Cervix: no lesions              Pap taken:  Yes.   Bimanual Exam:  Uterus:  normal size, contour, position, consistency, mobility, non-tender              Adnexa: no mass, fullness, tenderness              Rectal exam: Yes.  .  Confirms.              Anus:  normal sphincter tone, no lesions  Chaperone was present for exam.  Assessment:   Well woman visit with normal exam. Cardiac murmur.  Osteopenia.  Hemochromatosis.   Plan: Mammogram screening discussed. Self breast awareness reviewed. Pap and HR HPV as above. Guidelines for Calcium, Vitamin D, regular exercise program including cardiovascular and weight bearing exercise. BMD next year.  Follow up annually and prn.   After visit summary provided.

## 2019-01-30 NOTE — Telephone Encounter (Signed)
Scheduled appt per 7/14 los.  Spoke with patient and she is aware of her appt date and time.

## 2019-01-30 NOTE — Patient Instructions (Signed)

## 2019-01-31 ENCOUNTER — Encounter: Payer: Self-pay | Admitting: Hematology

## 2019-01-31 NOTE — Addendum Note (Signed)
Addended by: Truitt Merle on: 01/31/2019 11:59 AM   Modules accepted: Orders

## 2019-02-01 ENCOUNTER — Encounter: Payer: Self-pay | Admitting: Internal Medicine

## 2019-02-01 LAB — CYTOLOGY - PAP
Diagnosis: NEGATIVE
HPV: NOT DETECTED

## 2019-02-01 MED ORDER — DIAZEPAM 5 MG PO TABS: ORAL_TABLET | ORAL | 0 refills | Status: DC

## 2019-02-01 NOTE — Telephone Encounter (Signed)
Done erx 

## 2019-02-04 ENCOUNTER — Emergency Department (HOSPITAL_BASED_OUTPATIENT_CLINIC_OR_DEPARTMENT_OTHER)
Admission: EM | Admit: 2019-02-04 | Discharge: 2019-02-04 | Disposition: A | Discharge status: Home / Self Care | Payer: 59 | Attending: Emergency Medicine | Admitting: Emergency Medicine

## 2019-02-04 ENCOUNTER — Encounter (HOSPITAL_BASED_OUTPATIENT_CLINIC_OR_DEPARTMENT_OTHER): Payer: Self-pay | Admitting: Emergency Medicine

## 2019-02-04 ENCOUNTER — Other Ambulatory Visit: Payer: Self-pay

## 2019-02-04 DIAGNOSIS — F419 Anxiety disorder, unspecified: Secondary | ICD-10-CM | POA: Diagnosis present

## 2019-02-04 DIAGNOSIS — Z85828 Personal history of other malignant neoplasm of skin: Secondary | ICD-10-CM | POA: Insufficient documentation

## 2019-02-04 DIAGNOSIS — Z91040 Latex allergy status: Secondary | ICD-10-CM | POA: Diagnosis not present

## 2019-02-04 DIAGNOSIS — I10 Essential (primary) hypertension: Secondary | ICD-10-CM | POA: Insufficient documentation

## 2019-02-04 DIAGNOSIS — R002 Palpitations: Secondary | ICD-10-CM | POA: Diagnosis not present

## 2019-02-04 MED ORDER — HYDROXYZINE HCL 25 MG PO TABS: 25.0000 mg | ORAL_TABLET | Freq: Three times a day (TID) | ORAL | 0 refills | Status: DC | PRN

## 2019-02-04 NOTE — ED Provider Notes (Signed)
[facility] EMERGENCY DEPARTMENT Provider Note   CSN: [id number] Arrival date & time: 02/04/19  0240    History   Chief Complaint Chief Complaint  Patient presents with  . Anxiety  . Hypertension    HPI Anita Schmidt is a 63 y.o. female.     HPI Patient presents with worsening anxiety.  States she is more anxious because she is going have to have blood draws for her hemochromatosis.  States she is been more anxious at home about other things to.  States nothing is to trigger it.  States she was given a Valium to take before her blood draw.  No chest pain no trouble breathing.  States she will feel her blood pressure go up at times.  States it only goes up when she is anxious.  No chest pain.  No trouble breathing.  Long history of anxiety.  States she used to be able keep it under control with yoga but that is not been working recently.  She has a therapist she sees.  No swelling in her legs.  No weight loss. Past Medical History:  Diagnosis Date  . Anxiety    severe  . Anxiety disorder   . Fibrocystic breast disease   . Gilbert's syndrome   . Hemochromatosis   . Hyperlipidemia   . Skin cancer, basal cell    nose    Patient Active Problem List   Diagnosis Date Noted  . Abnormal EKG 07/03/2018  . Infraspinatus strain 04/20/2018  . It band syndrome, left 04/20/2018  . Cough 10/27/2017  . Wheezing 10/27/2017  . Jittery feeling 10/27/2017  . Hyperglycemia 04/07/2017  . Insect bite 04/07/2017  . Cardiac murmur 12/29/2016  . Pes anserine bursitis 10/19/2016  . Scapular dyskinesis 10/19/2016  . Encounter for well adult exam with abnormal findings 03/11/2016  . Generalized anxiety disorder 05/24/2013  . Elevated blood pressure reading without diagnosis of hypertension 05/23/2013  . Diverticulosis of colon without hemorrhage 04/27/2013  . Basal cell cancer 04/27/2013  . Hemochromatosis   . Bee sting allergy 03/26/2011  . IBS 02/10/2010  . Anxiety state  12/25/2008  . Leonard DISEASE, CERVICAL 11/19/2008  . RHINITIS 09/19/2008  . HYPERLIPIDEMIA 08/01/2008  . 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 MENOPAUSAL SYNDROME 08/01/2008  . FIBROCYSTIC BREAST DISEASE, HX OF 08/01/2008    Past Surgical History:  Procedure Laterality Date  . BREAST BIOPSY    . colonscopy  2011   Tics ; Dr Sharlett Iles  . DILATION AND CURETTAGE OF UTERUS    . INGUINAL HERNIA REPAIR    . MOHS SURGERY  2012   nose  . TONSILLECTOMY    . Tracer Pellets  2003   in breast; [place]     OB History    Gravida  2   Para      Term      Preterm      AB  2   Living  0     SAB      TAB      Ectopic      Multiple      Live Births               Home Medications    Prior to Admission medications   Medication Sig Start Date End Date Taking? Authorizing Provider  diazepam (VALIUM) 5 MG tablet 1 tab by mouth 1 hour prior to procedure 02/01/19   Biagio Borg, MD  hydrOXYzine (ATARAX/VISTARIL) 25 MG tablet Take 1  tablet (25 mg total) by mouth every 8 (eight) hours as needed for anxiety. 02/04/19   Davonna Belling, MD    Family History Family History  Problem Relation Age of Onset  . Hypertension Mother        PMH  . Alcohol abuse Mother   . Parkinson's disease Mother   . Anxiety disorder Brother   . Depression Brother   . Alzheimer's disease Maternal Grandmother   . Hypertension Maternal Grandmother   . Stroke Paternal Uncle   . Alzheimer's disease Paternal Uncle   . Diabetes Neg Hx   . Colon cancer Neg Hx   . Esophageal cancer Neg Hx     Social History Social History   Tobacco Use  . Smoking status: Never Smoker  . Smokeless tobacco: Never Used  Substance Use Topics  . Alcohol use: Yes    Alcohol/week: 2.0 standard drinks    Types: 2 Glasses of wine per week  . Drug use: No     Allergies   Latex, Epinephrine, Effexor [venlafaxine], Amoxicillin, and Citalopram   Review of Systems Review of Systems  Constitutional: Negative for  appetite change.  HENT: Negative for congestion.   Respiratory: Negative for shortness of breath.   Cardiovascular: Positive for palpitations. Negative for chest pain.  Gastrointestinal: Negative for abdominal pain.  Endocrine: Negative for polyuria.  Genitourinary: Negative for flank pain.  Musculoskeletal: Negative for back pain.  Neurological: Negative for weakness.  Psychiatric/Behavioral: Negative for suicidal ideas. The patient is nervous/anxious.      Physical Exam Updated Vital Signs BP (!) 193/114 (BP Location: Right Arm)   Pulse 80   Temp 98 F (36.7 C) (Oral)   Resp 18   Ht 5' 6"  (1.676 m)   Wt 59 kg   LMP 07/19/2008   SpO2 100%   BMI 20.98 kg/m   Physical Exam Vitals signs and nursing note reviewed.  HENT:     Head: Normocephalic.  Cardiovascular:     Rate and Rhythm: Regular rhythm.  Pulmonary:     Effort: Pulmonary effort is normal.  Musculoskeletal:     Right lower leg: No edema.     Left lower leg: No edema.  Skin:    General: Skin is warm.  Neurological:     Mental Status: She is alert.  Psychiatric:     Comments: Patient appears somewhat anxious.      ED Treatments / Results  Labs (all labs ordered are listed, but only abnormal results are displayed) Labs Reviewed - No data to display  EKG EKG Interpretation  Date/Time:  Sunday February 04 2019 10:48:11 EDT Ventricular Rate:  65 PR Interval:    QRS Duration: 92 QT Interval:  418 QTC Calculation: 435 R Axis:   31 Text Interpretation:  Sinus rhythm Probable left atrial enlargement Baseline wander in lead(s) V2 Confirmed by Davonna Belling [phone]) on 02/04/2019 11:16:46 AM   Radiology No results found.  Procedures Procedures (including critical care time)  Medications Ordered in ED Medications - No data to display   Initial Impression / Assessment and Plan / ED Course  I have reviewed the triage vital signs and the nursing notes.  Pertinent labs & imaging results that were  available during my care of the patient were reviewed by me and considered in my medical decision making (see chart for details).        Patient presents with likely anxiety.  History of same.  Has been more out of control recently.  Not  currently on treatment for it.  We will add some Vistaril to take as needed.  We will follow-up with her therapist.  Did have high blood pressure.  I think this secondary to the anxiety and does not need a separate treatment.  EKG reassuring.  Discharge home with outpatient follow-up.  Patient was nervous about having a glass of wine to help with her anxiety.  Final Clinical Impressions(s) / ED Diagnoses   Final diagnoses:  Anxiety  Hypertension, unspecified type    ED Discharge Orders         Ordered    hydrOXYzine (ATARAX/VISTARIL) 25 MG tablet  Every 8 hours PRN     02/04/19 1109           Davonna Belling, MD 02/04/19 1116

## 2019-02-04 NOTE — ED Triage Notes (Signed)
Pt c/o anxiety and states "it's through the roof". Has been feeling anxious since yesterday. She is scheduled to have blood drawn and feels nervous about that. Also concerned because her BP has been running high.

## 2019-02-04 NOTE — Discharge Instructions (Signed)
I think your high blood pressure is likely secondary to your anxiety.  It is something your primary care doctor can follow-up with however.  Follow-up with your therapist for further evaluation and treatment of the anxiety.

## 2019-02-05 ENCOUNTER — Other Ambulatory Visit: Payer: Self-pay

## 2019-02-05 ENCOUNTER — Inpatient Hospital Stay: Payer: 59

## 2019-02-05 NOTE — Patient Instructions (Signed)

## 2019-02-05 NOTE — Progress Notes (Signed)
Anita Schmidt presents today for phlebotomy per MD orders. Phlebotomy procedure started at 0921 w/ 18 g in L AC and ended at 0925. 279 grams removed. Patient observed for 30 minutes after procedure without any incident. Patient tolerated procedure well. IV needle removed intact.

## 2019-02-16 ENCOUNTER — Encounter: Payer: Self-pay | Admitting: Internal Medicine

## 2019-02-19 MED ORDER — AMLODIPINE BESYLATE 2.5 MG PO TABS: 2.5000 mg | ORAL_TABLET | Freq: Every day | ORAL | 3 refills | Status: DC

## 2019-02-21 MED ORDER — FLUOXETINE HCL 20 MG PO CAPS: 20.0000 mg | ORAL_CAPSULE | Freq: Every day | ORAL | 3 refills | Status: DC

## 2019-02-21 NOTE — Addendum Note (Signed)
Addended by: Biagio Borg on: 02/21/2019 10:59 AM   Modules accepted: Orders

## 2019-02-23 ENCOUNTER — Encounter: Payer: Self-pay | Admitting: Internal Medicine

## 2019-03-01 ENCOUNTER — Telehealth: Payer: Self-pay

## 2019-03-01 ENCOUNTER — Other Ambulatory Visit: Payer: Self-pay | Admitting: Obstetrics and Gynecology

## 2019-03-01 DIAGNOSIS — Z1231 Encounter for screening mammogram for malignant neoplasm of breast: Secondary | ICD-10-CM

## 2019-03-01 NOTE — Telephone Encounter (Signed)
Patient calls stating that she was too afraid to go to [facility] for MRI.  She states that [facility] on [facility]. Has an open MRI and is requesting [name] order it to be done there.  She has an upcoming appointment with [place] next week.  [phone]

## 2019-03-01 NOTE — Telephone Encounter (Signed)
I believe GI has open MRI also. Malachy Mood, could you check with them?   Darlena, do you have to get a new precert If I order MRI at [facility]?    Truitt Merle MD

## 2019-03-02 ENCOUNTER — Telehealth: Payer: Self-pay

## 2019-03-02 ENCOUNTER — Other Ambulatory Visit: Payer: Self-pay | Admitting: Hematology

## 2019-03-02 NOTE — Telephone Encounter (Signed)
Spoke to patient about the MRI scan, explained we are waiting on PA to be able to schedule it at [facility] on [address] fax [phone].  She verbalized an understanding. She wants to keep her appointments for next week with [name].

## 2019-03-05 NOTE — Progress Notes (Signed)
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 She presents to the clinic alone. She notes she is nauseous from anxiety. She notes she drank 8 ounces wine to help her anxiety last phlebotomy. She notes today she took half tablet Hydroxyzine but is not really working for her. She notes she had a piece of toast with cheese and water and tea this morning.     REVIEW OF SYSTEMS:   Constitutional: Denies fevers, chills or abnormal weight loss Eyes: Denies blurriness of vision Ears, nose, mouth, throat, and face: Denies mucositis or sore throat Respiratory: Denies cough, dyspnea or wheezes Cardiovascular: Denies palpitation, chest discomfort or lower extremity swelling Gastrointestinal:  Denies nausea, heartburn or change in bowel habits Skin: Denies abnormal skin rashes Lymphatics: Denies new lymphadenopathy or easy bruising Neurological:Denies numbness, tingling or new weaknesses Behavioral/Psych:  (+) anxious  All other systems were reviewed with the patient and are negative.  MEDICAL HISTORY:  Past Medical History:  Diagnosis Date  . Anxiety    severe  . Anxiety disorder   . Fibrocystic breast disease   . Gilbert's syndrome   . Hemochromatosis   . Hyperlipidemia   . Skin cancer, basal cell    nose    SURGICAL HISTORY: Past Surgical History:  Procedure Laterality Date  . BREAST BIOPSY    . colonscopy  2011   Tics ; Dr Sharlett Iles  . DILATION AND CURETTAGE OF UTERUS    . INGUINAL HERNIA REPAIR    . MOHS SURGERY  2012   nose  . TONSILLECTOMY    . Tracer Pellets  2003   in breast; [place]    I  have reviewed the social history and family history with the patient and they are unchanged from previous note.  ALLERGIES:  is allergic to latex; epinephrine; effexor [venlafaxine]; amoxicillin; and citalopram.  MEDICATIONS:  Current Outpatient Medications  Medication Sig Dispense Refill  . amLODipine (NORVASC) 2.5 MG tablet Take 1 tablet (2.5 mg total) by mouth daily. 90 tablet 3  . hydrOXYzine (ATARAX/VISTARIL) 25 MG tablet Take 1 tablet (25 mg total) by mouth every 8 (eight) hours as needed for anxiety. 10 tablet 0  . diazepam (VALIUM) 5 MG tablet 1 tab by mouth 1 hour prior to procedure 1 tablet 0  . FLUoxetine (PROZAC) 20 MG capsule Take 1 capsule (20 mg total) by mouth daily. 90 capsule 3   No current facility-administered medications for this visit.     PHYSICAL EXAMINATION: ECOG PERFORMANCE STATUS: 0 - Asymptomatic  Vitals:   03/07/19 0930  BP: (!) 159/90  Pulse: 72  Resp: 17  Temp: 98.7 F (37.1 C)  SpO2: 99%   Filed Weights   03/07/19 0930  Weight: 127 lb 11.2 oz (57.9 kg)    GENERAL:alert, no distress and comfortable SKIN: skin color, texture, turgor are normal, no rashes or significant lesions EYES: normal, Conjunctiva are pink and non-injected, sclera clear  NECK: supple, thyroid normal size, non-tender, without nodularity LYMPH:  no palpable lymphadenopathy in the cervical, axillary  LUNGS: clear to auscultation and percussion with normal breathing effort HEART: regular rate & rhythm and no murmurs  and no lower extremity edema ABDOMEN:abdomen soft, non-tender and normal bowel sounds Musculoskeletal:no cyanosis of digits and no clubbing  NEURO: alert & oriented x 3 with fluent speech, no focal motor/sensory deficits  LABORATORY DATA:  I have reviewed the data as listed CBC Latest Ref Rng & Units 03/07/2019 05/23/2018 04/07/2017  WBC 4.0 - 10.5 K/uL 4.9 5.0 5.8  Hemoglobin 12.0 - 15.0 g/dL 15.1(H) 15.1(H) 14.7  Hematocrit 36.0 - 46.0 % 45.0 44.1 43.5   Platelets 150 - 400 K/uL 306 286.0 268.0     CMP Latest Ref Rng & Units 05/23/2018 06/24/2017 04/07/2017  Glucose 70 - 99 mg/dL 114(H) - 97  BUN 6 - 23 mg/dL 16 - 14  Creatinine 0.40 - 1.20 mg/dL 0.80 - 0.75  Sodium 135 - 145 mEq/L 140 - 141  Potassium 3.5 - 5.1 mEq/L 4.0 - 3.9  Chloride 96 - 112 mEq/L 104 - 106  CO2 19 - 32 mEq/L 29 - 23  Calcium 8.4 - 10.5 mg/dL 9.5 - 9.7  Total Protein 6.0 - 8.3 g/dL 6.8 6.3 6.9  Total Bilirubin 0.2 - 1.2 mg/dL 0.8 0.8 0.7  Alkaline Phos 39 - 117 U/L 57 54 49  AST 0 - 37 U/L 17 15 15   ALT 0 - 35 U/L 12 10 11       RADIOGRAPHIC STUDIES: I have personally reviewed the radiological images as listed and agreed with the findings in the report. No results found.   ASSESSMENT & PLAN:  Anita Schmidt is a 63 y.o. female with   1. 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 She has been followed by her GI only. She has not had phlebotomy before. Her ferritin has been in the range of 150-290, iron saturation 60-72%, slightly higher lately  -Her US abdomen in 2017 shows normal spleen and fatty liver, otherwise negative. I do not see liver MRI or biopsy has been done before -I recommended MRI liver to evaluate if there is evidence of iron overload in liver and give baseline on phlebotomy frequency. Given her anxiety we postponed for now. She will call the open MRI at [facility] to schedule when she is ready  -She had Phlebotomy on 02/05/19. She took 8 ounces of wine to help her anxiety that day. With 1/2 unit removed she was fatigued afterward, but recovered well  -Labs today reviewed, CBC WNL except Hg 15.1. Iron panel still pending. Will remove 3/4 unit today. She took 1/2 tablet hydroxyzine this morning. She ate small amount and had water and tea. I encouraged her to drink plenty of water and eat adequately day of phlebotomy.  -I discussed given her possible livre damage from hemochromatosis, I  recommend she limit her alcohol intake to no more than once a week or once in a while.  -Will monitor her level with lab in 3 months and give Phlebotomy if needed  -F/u in 6 months    2. Anxiety -She also has white coat syndrome, anxiety of hospitals and claustrophobia.  -Not on medication but does yoga often  -06/2018 ECHO shows EF 65-70%, mild heart murmur -She notes she started neural feedback practice to help her be able to take a MRI.    PLAN:  -Phlebotomy today, will remove 3/4 unit.  -Lab in 3 months, phlebotomy if she has ferritin >100 or iron saturation>50%.  -will call her when she has liver MRI done, if she does have iron overload in liver,  will low phlebotomy criteria to ferritin< 100 and iron saturation less than 50% -Lab and f/u in 6 months   No problem-specific Assessment & Plan notes found for this encounter.   No orders of the defined types were placed in this encounter.  All questions were answered. The patient knows to call the clinic with any problems, questions or concerns. No barriers to learning was detected. I spent 15 minutes counseling the patient face to face. The total time spent in the appointment was 20 minutes and more than 50% was on counseling and review of test results     Truitt Merle, MD 03/07/2019   I, Joslyn Devon, am acting as scribe for Truitt Merle, MD.   I have reviewed the above documentation for accuracy and completeness, and I agree with the above.

## 2019-03-07 ENCOUNTER — Inpatient Hospital Stay: Payer: 59 | Attending: Hematology

## 2019-03-07 ENCOUNTER — Other Ambulatory Visit: Payer: Self-pay | Admitting: Hematology

## 2019-03-07 ENCOUNTER — Inpatient Hospital Stay: Payer: 59

## 2019-03-07 ENCOUNTER — Encounter: Payer: Self-pay | Admitting: Internal Medicine

## 2019-03-07 ENCOUNTER — Other Ambulatory Visit: Payer: Self-pay

## 2019-03-07 ENCOUNTER — Telehealth: Payer: Self-pay | Admitting: Hematology

## 2019-03-07 ENCOUNTER — Encounter: Payer: Self-pay | Admitting: Hematology

## 2019-03-07 ENCOUNTER — Inpatient Hospital Stay (HOSPITAL_BASED_OUTPATIENT_CLINIC_OR_DEPARTMENT_OTHER): Payer: 59 | Admitting: Hematology

## 2019-03-07 DIAGNOSIS — K760 Fatty (change of) liver, not elsewhere classified: Secondary | ICD-10-CM | POA: Diagnosis not present

## 2019-03-07 DIAGNOSIS — Z85828 Personal history of other malignant neoplasm of skin: Secondary | ICD-10-CM | POA: Diagnosis not present

## 2019-03-07 DIAGNOSIS — R110 Nausea: Secondary | ICD-10-CM | POA: Diagnosis not present

## 2019-03-07 DIAGNOSIS — Z79899 Other long term (current) drug therapy: Secondary | ICD-10-CM | POA: Diagnosis not present

## 2019-03-07 DIAGNOSIS — F40240 Claustrophobia: Secondary | ICD-10-CM | POA: Insufficient documentation

## 2019-03-07 DIAGNOSIS — F419 Anxiety disorder, unspecified: Secondary | ICD-10-CM | POA: Insufficient documentation

## 2019-03-07 DIAGNOSIS — E785 Hyperlipidemia, unspecified: Secondary | ICD-10-CM | POA: Insufficient documentation

## 2019-03-07 LAB — CBC WITH DIFFERENTIAL (CANCER CENTER ONLY)
Abs Immature Granulocytes: 0.01 10*3/uL (ref 0.00–0.07)
Basophils Absolute: 0.1 10*3/uL (ref 0.0–0.1)
Basophils Relative: 1 %
Eosinophils Absolute: 0 10*3/uL (ref 0.0–0.5)
Eosinophils Relative: 1 %
HCT: 45 % (ref 36.0–46.0)
Hemoglobin: 15.1 g/dL — ABNORMAL HIGH (ref 12.0–15.0)
Immature Granulocytes: 0 %
Lymphocytes Relative: 39 %
Lymphs Abs: 1.9 10*3/uL (ref 0.7–4.0)
MCH: 31.7 pg (ref 26.0–34.0)
MCHC: 33.6 g/dL (ref 30.0–36.0)
MCV: 94.5 fL (ref 80.0–100.0)
Monocytes Absolute: 0.5 10*3/uL (ref 0.1–1.0)
Monocytes Relative: 10 %
Neutro Abs: 2.4 10*3/uL (ref 1.7–7.7)
Neutrophils Relative %: 49 %
Platelet Count: 306 10*3/uL (ref 150–400)
RBC: 4.76 MIL/uL (ref 3.87–5.11)
RDW: 12.1 % (ref 11.5–15.5)
WBC Count: 4.9 10*3/uL (ref 4.0–10.5)
nRBC: 0 % (ref 0.0–0.2)

## 2019-03-07 LAB — IRON AND TIBC
Iron: 199 ug/dL — ABNORMAL HIGH (ref 41–142)
Saturation Ratios: 74 % — ABNORMAL HIGH (ref 21–57)
TIBC: 269 ug/dL (ref 236–444)
UIBC: 70 ug/dL — ABNORMAL LOW (ref 120–384)

## 2019-03-07 LAB — FERRITIN: Ferritin: 172 ng/mL (ref 11–307)

## 2019-03-07 NOTE — Progress Notes (Signed)
Anita Schmidt presents today for phlebotomy per MD orders. 18g needle in RAC. Snack and drink provided with rest prior to procedure. Phlebotomy procedure started at 1034 and ended at 1039. 375 grams removed per [name]. Patient observed for 30 minutes after procedure without any incident. Patient tolerated procedure well. IV needle removed intact.

## 2019-03-07 NOTE — Patient Instructions (Signed)

## 2019-03-07 NOTE — Telephone Encounter (Signed)
Spoke with patient re appointments for November/February. Patient is MyChart active and declined printout.

## 2019-03-08 ENCOUNTER — Encounter: Payer: Self-pay | Admitting: Hematology

## 2019-03-08 ENCOUNTER — Telehealth: Payer: Self-pay

## 2019-03-08 NOTE — Telephone Encounter (Signed)
-----   Message from Truitt Merle, MD sent at 03/07/2019 11:35 PM EDT ----- Please let pt know her iron level today did not change much after 1/2 unit phlebotomy last time. Check if she did OK with phlebotomy today, and if she is willing to return for another phlebotomy in 4-6 weeks, thanks   Truitt Merle  03/07/2019

## 2019-03-08 NOTE — Telephone Encounter (Signed)
Spoke with patient regarding phelbotomy yesterday, she states she didn't like it but thinks it went okay.  Per [name] she is recommending another phelbotomy in 4 to 6 weeks, the patient is agreeable to this and requests an afternoon appointment.  A scheduling message was sent.

## 2019-03-09 ENCOUNTER — Telehealth: Payer: Self-pay | Admitting: Hematology

## 2019-03-09 NOTE — Telephone Encounter (Signed)
Scheduled appt per 8/20 sch message - unable to reach pt . Left message for patient with appt date and time

## 2019-03-21 ENCOUNTER — Encounter: Payer: Self-pay | Admitting: Internal Medicine

## 2019-04-03 ENCOUNTER — Ambulatory Visit: Payer: 59 | Admitting: Obstetrics and Gynecology

## 2019-04-06 ENCOUNTER — Telehealth: Payer: Self-pay

## 2019-04-06 ENCOUNTER — Other Ambulatory Visit: Payer: Self-pay

## 2019-04-06 NOTE — Telephone Encounter (Signed)
Patient left a voicemail stating she changed her mind and would prefer to have her MRI done at [facility] rather than [facility]. Re-entered order for MRI of abdomen, and called patient back to inform her that [facility] should be getting in contact with her within the next couple of weeks to get that appointment scheduled. Patient stated she would call over to [facility] herself later today to get the appointment finalized. Patient denied any other needs or concerns at this time, and knows to call our office if she needs anything else in the meantime.

## 2019-04-09 ENCOUNTER — Other Ambulatory Visit: Payer: Self-pay

## 2019-04-09 ENCOUNTER — Ambulatory Visit
Admission: RE | Admit: 2019-04-09 | Discharge: 2019-04-09 | Disposition: A | Discharge status: Home / Self Care | Payer: 59 | Source: Ambulatory Visit | Attending: Obstetrics and Gynecology | Admitting: Obstetrics and Gynecology

## 2019-04-09 DIAGNOSIS — Z1231 Encounter for screening mammogram for malignant neoplasm of breast: Secondary | ICD-10-CM

## 2019-04-11 ENCOUNTER — Other Ambulatory Visit: Payer: 59

## 2019-04-11 ENCOUNTER — Telehealth: Payer: Self-pay

## 2019-04-11 NOTE — Telephone Encounter (Signed)
Per Dr Burr Medico, may do phlebotomy on 04/12/19 based on 03/07/19 ferretin level. May only removed half the normal volume (250 grms) if pt is more comfortable with that.

## 2019-04-12 ENCOUNTER — Inpatient Hospital Stay: Payer: 59 | Attending: Hematology

## 2019-04-12 ENCOUNTER — Other Ambulatory Visit: Payer: Self-pay

## 2019-04-12 NOTE — Patient Instructions (Signed)
Therapeutic Phlebotomy Therapeutic phlebotomy is the planned removal of blood from a person's body for the purpose of treating a medical condition. The procedure is similar to donating blood. Usually, about a pint (470 mL, or 0.47 L) of blood is removed. The average adult has 9-12 pints (4.3-5.7 L) of blood in the body. Therapeutic phlebotomy may be used to treat the following medical conditions:  Hemochromatosis. This is a condition in which the blood contains too much iron.  Polycythemia vera. This is a condition in which the blood contains too many red blood cells.  Porphyria cutanea tarda. This is a disease in which an important part of hemoglobin is not made properly. It results in the buildup of abnormal amounts of porphyrins in the body.  Sickle cell disease. This is a condition in which the red blood cells form an abnormal crescent shape rather than a round shape. Tell a health care provider about:  Any allergies you have.  All medicines you are taking, including vitamins, herbs, eye drops, creams, and over-the-counter medicines.  Any problems you or family members have had with anesthetic medicines.  Any blood disorders you have.  Any surgeries you have had.  Any medical conditions you have.  Whether you are pregnant or may be pregnant. What are the risks? Generally, this is a safe procedure. However, problems may occur, including:  Nausea or light-headedness.  Low blood pressure (hypotension).  Soreness, bleeding, swelling, or bruising at the needle insertion site.  Infection. What happens before the procedure?  Follow instructions from your health care provider about eating or drinking restrictions.  Ask your health care provider about: ? Changing or stopping your regular medicines. This is especially important if you are taking diabetes medicines or blood thinners (anticoagulants). ? Taking medicines such as aspirin and ibuprofen. These medicines can thin your  blood. Do not take these medicines unless your health care provider tells you to take them. ? Taking over-the-counter medicines, vitamins, herbs, and supplements.  Wear clothing with sleeves that can be raised above the elbow.  Plan to have someone take you home from the hospital or clinic.  You may have a blood sample taken.  Your blood pressure, pulse rate, and breathing rate will be measured. What happens during the procedure?   To lower your risk of infection: ? Your health care team will wash or sanitize their hands. ? Your skin will be cleaned with an antiseptic.  You may be given a medicine to numb the area (local anesthetic).  A tourniquet will be placed on your arm.  A needle will be inserted into one of your veins.  Tubing and a collection bag will be attached to that needle.  Blood will flow through the needle and tubing into the collection bag.  The collection bag will be placed lower than your arm to allow gravity to help the flow of blood into the bag.  You may be asked to open and close your hand slowly and continually during the entire collection.  After the specified amount of blood has been removed from your body, the collection bag and tubing will be clamped.  The needle will be removed from your vein.  Pressure will be held on the site of the needle insertion to stop the bleeding.  A bandage (dressing) will be placed over the needle insertion site. The procedure may vary among health care providers and hospitals. What happens after the procedure?  Your blood pressure, pulse rate, and breathing rate will be   measured after the procedure.  You will be encouraged to drink fluids.  Your recovery will be assessed and monitored.  You can return to your normal activities as told by your health care provider. Summary  Therapeutic phlebotomy is the planned removal of blood from a person's body for the purpose of treating a medical condition.  Therapeutic  phlebotomy may be used to treat hemochromatosis, polycythemia vera, porphyria cutanea tarda, or sickle cell disease.  In the procedure, a needle is inserted and about a pint (470 mL, or 0.47 L) of blood is removed. The average adult has 9-12 pints (4.3-5.7 L) of blood in the body.  This is generally a safe procedure, but it can sometimes cause problems such as nausea, light-headedness, or low blood pressure (hypotension). This information is not intended to replace advice given to you by your health care provider. Make sure you discuss any questions you have with your health care provider. Document Released: 12/07/2010 Document Revised: 07/21/2017 Document Reviewed: 07/21/2017 Elsevier Patient Education  2020 [employer] Inc[employer]  

## 2019-04-12 NOTE — Progress Notes (Signed)
Anita Schmidt presents today for phlebotomy per MD orders. Phlebotomy procedure started at 1355 and ended at 1405. 501 grams removed using 16 g phlebotomy set through the right AC. Patient tolerated procedure well. IV needle removed intact. VSS at discharge.

## 2019-04-17 ENCOUNTER — Encounter: Payer: Self-pay | Admitting: Hematology

## 2019-04-20 ENCOUNTER — Other Ambulatory Visit: Payer: Self-pay | Admitting: Hematology

## 2019-04-20 MED ORDER — DIAZEPAM 5 MG PO TABS: ORAL_TABLET | ORAL | 0 refills | Status: DC

## 2019-04-25 ENCOUNTER — Other Ambulatory Visit: Payer: Self-pay | Admitting: Hematology

## 2019-04-27 ENCOUNTER — Other Ambulatory Visit: Payer: Self-pay

## 2019-04-27 ENCOUNTER — Encounter: Payer: Self-pay | Admitting: Hematology

## 2019-04-27 ENCOUNTER — Telehealth: Payer: Self-pay | Admitting: Hematology

## 2019-04-27 ENCOUNTER — Telehealth: Payer: Self-pay

## 2019-04-27 ENCOUNTER — Inpatient Hospital Stay: Payer: Medicare Other | Attending: Hematology

## 2019-04-27 LAB — CBC WITH DIFFERENTIAL (CANCER CENTER ONLY)
Abs Immature Granulocytes: 0.01 10*3/uL (ref 0.00–0.07)
Basophils Absolute: 0 10*3/uL (ref 0.0–0.1)
Basophils Relative: 0 %
Eosinophils Absolute: 0 10*3/uL (ref 0.0–0.5)
Eosinophils Relative: 1 %
HCT: 39.8 % (ref 36.0–46.0)
Hemoglobin: 13.8 g/dL (ref 12.0–15.0)
Immature Granulocytes: 0 %
Lymphocytes Relative: 30 %
Lymphs Abs: 1.5 10*3/uL (ref 0.7–4.0)
MCH: 32.2 pg (ref 26.0–34.0)
MCHC: 34.7 g/dL (ref 30.0–36.0)
MCV: 92.8 fL (ref 80.0–100.0)
Monocytes Absolute: 0.4 10*3/uL (ref 0.1–1.0)
Monocytes Relative: 8 %
Neutro Abs: 3 10*3/uL (ref 1.7–7.7)
Neutrophils Relative %: 61 %
Platelet Count: 274 10*3/uL (ref 150–400)
RBC: 4.29 MIL/uL (ref 3.87–5.11)
RDW: 12.1 % (ref 11.5–15.5)
WBC Count: 5 10*3/uL (ref 4.0–10.5)
nRBC: 0 % (ref 0.0–0.2)

## 2019-04-27 LAB — CMP (CANCER CENTER ONLY)
ALT: 13 U/L (ref 0–44)
AST: 17 U/L (ref 15–41)
Albumin: 4.5 g/dL (ref 3.5–5.0)
Alkaline Phosphatase: 59 U/L (ref 38–126)
Anion gap: 7 (ref 5–15)
BUN: 15 mg/dL (ref 8–23)
CO2: 28 mmol/L (ref 22–32)
Calcium: 9.4 mg/dL (ref 8.9–10.3)
Chloride: 106 mmol/L (ref 98–111)
Creatinine: 0.76 mg/dL (ref 0.44–1.00)
GFR, Est AFR Am: >60 mL/min (ref >60)
GFR, Estimated: >60 mL/min (ref >60)
Glucose, Bld: 108 mg/dL — ABNORMAL HIGH (ref 70–99)
Potassium: 4.1 mmol/L (ref 3.5–5.1)
Sodium: 141 mmol/L (ref 135–145)
Total Bilirubin: 0.6 mg/dL (ref 0.3–1.2)
Total Protein: 6.9 g/dL (ref 6.5–8.1)

## 2019-04-27 LAB — IRON AND TIBC
Iron: 108 ug/dL (ref 41–142)
Saturation Ratios: 40 % (ref 21–57)
TIBC: 266 ug/dL (ref 236–444)
UIBC: 158 ug/dL (ref 120–384)

## 2019-04-27 LAB — FERRITIN: Ferritin: 119 ng/mL (ref 11–307)

## 2019-04-27 NOTE — Telephone Encounter (Signed)
04/26/19: Patient left a voicemail that she was worried her scerla appeared yellow. Updated [name] who recommended patient come in for labs Friday or Monday before her MRI, as well has be scheduled for a phone visit with [name] or Hendricks Limes  Sent patient a MyChart message with above information. Patient responded back that she was able to come in for labs today 04/27/19 and could day any day next week except Tuesday from 8-10am.   High priority scheduling message sent for patient to have lab appointment today and virtual visit with provider next week.

## 2019-04-27 NOTE — Telephone Encounter (Signed)
I TALK  With patient regarding schedule

## 2019-04-30 ENCOUNTER — Ambulatory Visit
Admission: RE | Admit: 2019-04-30 | Discharge: 2019-04-30 | Disposition: A | Discharge status: Home / Self Care | Payer: Medicare Other | Source: Ambulatory Visit | Attending: Hematology | Admitting: Hematology

## 2019-04-30 ENCOUNTER — Other Ambulatory Visit: Payer: Self-pay

## 2019-05-01 NOTE — Progress Notes (Signed)
[place]   [phone] [phone]   Clinic Follow up Note   Patient Care Team: Biagio Borg, MD as PCP - General (Internal Medicine) 05/02/2019  I connected with Anita Schmidt on 05/02/19 at 12:00 PM by telephone visit and verified that I am speaking with the correct person using two identifiers.   I discussed the limitations, risks, security and privacy concerns of performing an evaluation and management service by telemedicine and the availability of in-person appointments. I also discussed with the patient that there may be a patient responsible charge related to this service. The patient expressed understanding and agreed to proceed.   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 She is feeling "better all the time." She continues to have high anxiety with health related events. Was able to do MRI without medication. She sees a therapist at [facility] which is really helping. She is tolerating phlebotomy well and her procedure related anxiety is improving. She has abstained from alcohol for 2 months. She continues toga and walks 2.5-3 miles daily. She has good nutrition but has lost 3 lbs due to anxiety-induced vomiting. Overall, she denies headache, fever, chills, change in bowel habits, or pain.   MEDICAL HISTORY:  Past Medical History:  Diagnosis Date  . Anxiety    severe  . Anxiety disorder   . Fibrocystic breast disease   . Gilbert's syndrome   . Hemochromatosis   . Hyperlipidemia   . Skin cancer, basal cell    nose    SURGICAL HISTORY: Past Surgical History:  Procedure Laterality Date  . BREAST BIOPSY    . colonscopy  2011   Tics ; Dr Sharlett Iles  . DILATION AND CURETTAGE OF UTERUS    . INGUINAL HERNIA REPAIR    . MOHS SURGERY   2012   nose  . TONSILLECTOMY    . Tracer Pellets  2003   in breast; [place]    I have reviewed the social history and family history with the patient and they are unchanged from previous note.  ALLERGIES:  is allergic to latex; epinephrine; effexor [venlafaxine]; amoxicillin; and citalopram.  MEDICATIONS:  Current Outpatient Medications  Medication Sig Dispense Refill  . amLODipine (NORVASC) 2.5 MG tablet Take 1 tablet (2.5 mg total) by mouth daily. 90 tablet 3  . diazepam (VALIUM) 5 MG tablet 1 tab by mouth 1 hour prior to MRI, OK to take second dose if first dose not enough right 20 mins before MRI, do not drive after taking this medicine 2 tablet 0  . FLUoxetine (PROZAC) 20 MG capsule Take 1 capsule (20 mg total) by mouth daily. 90 capsule 3  . hydrOXYzine (ATARAX/VISTARIL) 25 MG tablet Take 1 tablet (25 mg total) by mouth every 8 (eight) hours as needed for anxiety. 10 tablet 0   No current facility-administered medications for this visit.     PHYSICAL EXAMINATION: No vitals for this visit   Patient appears well over the phone. Speech is regular and non-pressured. Mood and affect appear normal for situation.   LABORATORY DATA:  I have reviewed the data as listed CBC Latest Ref Rng & Units 04/27/2019 03/07/2019 05/23/2018  WBC 4.0 - 10.5 K/uL 5.0 4.9 5.0  Hemoglobin 12.0 - 15.0 g/dL 13.8 15.1(H) 15.1(H)  Hematocrit 36.0 - 46.0 % 39.8 45.0 44.1  Platelets  150 - 400 K/uL 274 306 286.0     CMP Latest Ref Rng & Units 04/27/2019 05/23/2018 06/24/2017  Glucose 70 - 99 mg/dL 108(H) 114(H) -  BUN 8 - 23 mg/dL 15 16 -  Creatinine 0.44 - 1.00 mg/dL 0.76 0.80 -  Sodium 135 - 145 mmol/L 141 140 -  Potassium 3.5 - 5.1 mmol/L 4.1 4.0 -  Chloride 98 - 111 mmol/L 106 104 -  CO2 22 - 32 mmol/L 28 29 -  Calcium 8.9 - 10.3 mg/dL 9.4 9.5 -  Total Protein 6.5 - 8.1 g/dL 6.9 6.8 6.3  Total Bilirubin 0.3 - 1.2 mg/dL 0.6 0.8 0.8  Alkaline Phos 38 - 126 U/L 59 57 54  AST 15 - 41 U/L 17 17 15   ALT  0 - 44 U/L 13 12 10       RADIOGRAPHIC STUDIES: I have personally reviewed the radiological images as listed and agreed with the findings in the report. No results found.   ASSESSMENT & PLAN: Anita Schmidt is a 63 y.o. 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 Anita Schmidt been followedby her [place]. Before phlebotomy, ferritin has been in the range of 150-290, iron saturation 60-72%, slightly higher lately  -Her US abdomen in 2017 shows normal spleen andfattyliver, otherwise negative. -06/2018 echo shows EF 65-70%, she has a heart murmur; will repeat echo q5-10 years due to risk for iron deposition in the heart  -She underwent abdominal MRI which shows iron deposition in the liver and spleen, compatible with hemochromatosis -I reviewed with [name] and discussed results with the patient. To prevent liver damage such as cirrhosis, will lower phlebotomy paraemeters and do procedure if ferritin or iron saturation above 50.  -Anita Schmidt appears well. I reviewed her recent labs, CBC and CMP normal, ferritin 119, iron saturation 40%.  -Will proceed with therapeutic phlebotomy in the next week  -she will return for lab in 3 months and f/u in 6 months   2. Anxiety -related to medical procedures and claustrophobia  -has taken hydroxyzine and valium sparingly for medical procedures.  -She was able to do MRI without meds this time, and phlebotomy is becoming more tolerable for her.  -she is seen by mental health provider at [facility]  -improved with exercise and yoga -she asked if she can have some wine on her birthday this weekend, I told her one glass rarely is OK  PLAN -Labs, MRI reviewed -Phlebotomy PRN if ferritin or iron saturation >50  -Phlebotomy in the next week for ferritin 108 -Lab in 3 months -F/u in 6 months   if All questions were answered. The patient knows to call the clinic with any problems,  questions or concerns. No barriers to learning was detected. I spent 13 minutes non-face-to-face time in today's encounter and more than 50% was on counseling and review of test results     Alla Feeling, NP 05/02/19

## 2019-05-02 ENCOUNTER — Inpatient Hospital Stay (HOSPITAL_BASED_OUTPATIENT_CLINIC_OR_DEPARTMENT_OTHER): Payer: Medicare Other | Admitting: Nurse Practitioner

## 2019-05-02 ENCOUNTER — Encounter: Payer: Self-pay | Admitting: Nurse Practitioner

## 2019-05-02 ENCOUNTER — Telehealth: Payer: Self-pay

## 2019-05-03 ENCOUNTER — Telehealth: Payer: Self-pay | Admitting: Nurse Practitioner

## 2019-05-03 NOTE — Telephone Encounter (Signed)
Scheduled appt per 10/14 los.  Sent a staff message to get a calendar mailed out.

## 2019-05-07 ENCOUNTER — Telehealth: Payer: Self-pay | Admitting: Hematology

## 2019-05-07 NOTE — Telephone Encounter (Signed)
Scheduled appt per 10/19 sch message - pt aware of appt date and time   

## 2019-05-09 ENCOUNTER — Inpatient Hospital Stay: Payer: Medicare Other

## 2019-05-09 ENCOUNTER — Other Ambulatory Visit: Payer: Self-pay

## 2019-05-09 NOTE — Progress Notes (Signed)
Successful Theraputic phlebotomy performed in right AC x1 attempt. 510g of blood return noted. VSS post-procedure. Discharged in no acute distress.

## 2019-05-09 NOTE — Patient Instructions (Signed)

## 2019-05-21 ENCOUNTER — Encounter: Payer: Self-pay | Admitting: Internal Medicine

## 2019-05-22 ENCOUNTER — Telehealth: Payer: Self-pay

## 2019-05-22 NOTE — Telephone Encounter (Signed)
Questions for Screening COVID-19  Symptom onset: None  Travel or Contacts: None  During this illness, did/does the patient experience any of the following symptoms? Fever >100.50F []   Yes [x]   No []   Unknown Subjective fever (felt feverish) []   Yes [x]   No []   Unknown Chills []   Yes []   No []   Unknown Muscle aches (myalgia) []   Yes [x]   No []   Unknown Runny nose (rhinorrhea) []   Yes [x]   No []   Unknown Sore throat []   Yes [x]   No []   Unknown Cough (new onset or worsening of chronic cough) []   Yes [x]   No []   Unknown Shortness of breath (dyspnea) []   Yes [x]   No []   Unknown Nausea or vomiting []   Yes [x]   No []   Unknown Headache []   Yes [x]   No []   Unknown Abdominal pain  []   Yes [x]   No []   Unknown Diarrhea (?3 loose/looser than normal stools/24hr period) []   Yes [x]   No []   Unknown Other, specify:  Patient risk factors: Smoker? []   Current []   Former []   Never If female, currently pregnant? []   Yes []   No  Patient Active Problem List   Diagnosis Date Noted  . Abnormal EKG 07/03/2018  . Infraspinatus strain 04/20/2018  . It band syndrome, left 04/20/2018  . Cough 10/27/2017  . Wheezing 10/27/2017  . Jittery feeling 10/27/2017  . Hyperglycemia 04/07/2017  . Insect bite 04/07/2017  . Cardiac murmur 12/29/2016  . Pes anserine bursitis 10/19/2016  . Scapular dyskinesis 10/19/2016  . Encounter for well adult exam with abnormal findings 03/11/2016  . Generalized anxiety disorder 05/24/2013  . Elevated blood pressure reading without diagnosis of hypertension 05/23/2013  . Diverticulosis of colon without hemorrhage 04/27/2013  . Basal cell cancer 04/27/2013  . Hemochromatosis   . Bee sting allergy 03/26/2011  . IBS 02/10/2010  . Anxiety state 12/25/2008  . [place] DISEASE, CERVICAL 11/19/2008  . RHINITIS 09/19/2008  . HYPERLIPIDEMIA 08/01/2008  . 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 MENOPAUSAL SYNDROME 08/01/2008  . FIBROCYSTIC BREAST DISEASE, HX OF 08/01/2008    Plan:  []    High risk for COVID-19 with red flags go to ED (with CP, SOB, weak/lightheaded, or fever > 101.5). Call ahead.  []   High risk for COVID-19 but stable. Inform provider and coordinate time for [facility] visit.   []   No red flags but URI signs or symptoms okay for [facility] visit.

## 2019-05-23 ENCOUNTER — Ambulatory Visit: Payer: Medicare Other

## 2019-05-23 ENCOUNTER — Other Ambulatory Visit: Payer: Self-pay

## 2019-05-23 ENCOUNTER — Encounter: Payer: Self-pay | Admitting: Internal Medicine

## 2019-06-06 ENCOUNTER — Other Ambulatory Visit: Payer: Self-pay

## 2019-06-06 ENCOUNTER — Inpatient Hospital Stay: Payer: Medicare Other | Attending: Hematology

## 2019-06-06 LAB — FERRITIN: Ferritin: 71 ng/mL (ref 11–307)

## 2019-06-06 LAB — IRON AND TIBC
Iron: 111 ug/dL (ref 41–142)
Saturation Ratios: 41 % (ref 21–57)
TIBC: 268 ug/dL (ref 236–444)
UIBC: 157 ug/dL (ref 120–384)

## 2019-06-06 LAB — CBC WITH DIFFERENTIAL (CANCER CENTER ONLY)
Abs Immature Granulocytes: 0.01 10*3/uL (ref 0.00–0.07)
Basophils Absolute: 0.1 10*3/uL (ref 0.0–0.1)
Basophils Relative: 2 %
Eosinophils Absolute: 0.1 10*3/uL (ref 0.0–0.5)
Eosinophils Relative: 1 %
HCT: 42.1 % (ref 36.0–46.0)
Hemoglobin: 14.3 g/dL (ref 12.0–15.0)
Immature Granulocytes: 0 %
Lymphocytes Relative: 47 %
Lymphs Abs: 2 10*3/uL (ref 0.7–4.0)
MCH: 32.7 pg (ref 26.0–34.0)
MCHC: 34 g/dL (ref 30.0–36.0)
MCV: 96.3 fL (ref 80.0–100.0)
Monocytes Absolute: 0.5 10*3/uL (ref 0.1–1.0)
Monocytes Relative: 11 %
Neutro Abs: 1.6 10*3/uL — ABNORMAL LOW (ref 1.7–7.7)
Neutrophils Relative %: 39 %
Platelet Count: 259 10*3/uL (ref 150–400)
RBC: 4.37 MIL/uL (ref 3.87–5.11)
RDW: 11.9 % (ref 11.5–15.5)
WBC Count: 4.2 10*3/uL (ref 4.0–10.5)
nRBC: 0 % (ref 0.0–0.2)

## 2019-06-11 ENCOUNTER — Telehealth: Payer: Self-pay | Admitting: Hematology

## 2019-06-11 NOTE — Telephone Encounter (Signed)
Scheduled appt per 11/23 sch message - pt is aware of appt date and time   

## 2019-06-15 ENCOUNTER — Telehealth: Payer: Self-pay | Admitting: Hematology

## 2019-06-15 NOTE — Telephone Encounter (Signed)
Returned patient's phone call regarding rescheduling 12/02 appointment time, informed patient time requested is not available. Patient will keep appointment as is.

## 2019-06-20 ENCOUNTER — Inpatient Hospital Stay: Payer: Medicare Other | Attending: Hematology

## 2019-06-20 ENCOUNTER — Other Ambulatory Visit: Payer: Self-pay

## 2019-06-20 NOTE — Progress Notes (Signed)
Anita Schmidt presents today for phlebotomy per MD orders. Phlebotomy procedure started at 0820 w/ 16 G in R AC and ended at 0839. 514 grams removed. Patient observed for 30 minutes after procedure without any incident. Patient tolerated procedure well. IV needle removed intact.

## 2019-06-27 ENCOUNTER — Encounter: Payer: Self-pay | Admitting: Hematology

## 2019-07-05 ENCOUNTER — Ambulatory Visit (INDEPENDENT_AMBULATORY_CARE_PROVIDER_SITE_OTHER): Payer: Medicare Other | Admitting: Internal Medicine

## 2019-07-05 ENCOUNTER — Encounter: Payer: Self-pay | Admitting: Internal Medicine

## 2019-07-05 ENCOUNTER — Other Ambulatory Visit (INDEPENDENT_AMBULATORY_CARE_PROVIDER_SITE_OTHER): Payer: Medicare Other

## 2019-07-05 ENCOUNTER — Other Ambulatory Visit: Payer: Self-pay

## 2019-07-05 VITALS — BP 134/84 | HR 78 | Temp 98.5°F | Ht 66.0 in | Wt 131.0 lb

## 2019-07-05 DIAGNOSIS — Z0000 Encounter for general adult medical examination without abnormal findings: Secondary | ICD-10-CM | POA: Diagnosis not present

## 2019-07-05 DIAGNOSIS — R739 Hyperglycemia, unspecified: Secondary | ICD-10-CM

## 2019-07-05 DIAGNOSIS — E538 Deficiency of other specified B group vitamins: Secondary | ICD-10-CM | POA: Diagnosis not present

## 2019-07-05 DIAGNOSIS — E559 Vitamin D deficiency, unspecified: Secondary | ICD-10-CM

## 2019-07-05 LAB — CBC WITH DIFFERENTIAL/PLATELET
Basophils Absolute: 0.1 10*3/uL (ref 0.0–0.1)
Basophils Relative: 1.1 % (ref 0.0–3.0)
Eosinophils Absolute: 0.1 10*3/uL (ref 0.0–0.7)
Eosinophils Relative: 1.1 % (ref 0.0–5.0)
HCT: 40.1 % (ref 36.0–46.0)
Hemoglobin: 13.7 g/dL (ref 12.0–15.0)
Lymphocytes Relative: 36.1 % (ref 12.0–46.0)
Lymphs Abs: 1.8 10*3/uL (ref 0.7–4.0)
MCHC: 34.2 g/dL (ref 30.0–36.0)
MCV: 95.1 fl (ref 78.0–100.0)
Monocytes Absolute: 0.6 10*3/uL (ref 0.1–1.0)
Monocytes Relative: 11 % (ref 3.0–12.0)
Neutro Abs: 2.5 10*3/uL (ref 1.4–7.7)
Neutrophils Relative %: 50.7 % (ref 43.0–77.0)
Platelets: 292 10*3/uL (ref 150.0–400.0)
RBC: 4.21 Mil/uL (ref 3.87–5.11)
RDW: 12.5 % (ref 11.5–15.5)
WBC: 5 10*3/uL (ref 4.0–10.5)

## 2019-07-05 LAB — LIPID PANEL
Cholesterol: 198 mg/dL (ref 0–200)
HDL: 65.2 mg/dL (ref >39.00)
LDL Cholesterol: 116 mg/dL — ABNORMAL HIGH (ref 0–99)
NonHDL: 132.69
Total CHOL/HDL Ratio: 3
Triglycerides: 83 mg/dL (ref 0.0–149.0)
VLDL: 16.6 mg/dL (ref 0.0–40.0)

## 2019-07-05 LAB — VITAMIN D 25 HYDROXY (VIT D DEFICIENCY, FRACTURES): VITD: 37.71 ng/mL (ref 30.00–100.00)

## 2019-07-05 LAB — HEPATIC FUNCTION PANEL
ALT: 11 U/L (ref 0–35)
AST: 15 U/L (ref 0–37)
Albumin: 4.5 g/dL (ref 3.5–5.2)
Alkaline Phosphatase: 55 U/L (ref 39–117)
Bilirubin, Direct: 0.1 mg/dL (ref 0.0–0.3)
Total Bilirubin: 0.6 mg/dL (ref 0.2–1.2)
Total Protein: 6.7 g/dL (ref 6.0–8.3)

## 2019-07-05 LAB — BASIC METABOLIC PANEL
BUN: 16 mg/dL (ref 6–23)
CO2: 31 mEq/L (ref 19–32)
Calcium: 9.5 mg/dL (ref 8.4–10.5)
Chloride: 104 mEq/L (ref 96–112)
Creatinine, Ser: 1.08 mg/dL (ref 0.40–1.20)
GFR: 50.89 mL/min — ABNORMAL LOW (ref >60.00)
Glucose, Bld: 80 mg/dL (ref 70–99)
Potassium: 3.9 mEq/L (ref 3.5–5.1)
Sodium: 141 mEq/L (ref 135–145)

## 2019-07-05 LAB — HEMOGLOBIN A1C: Hgb A1c MFr Bld: 5.4 % (ref 4.6–6.5)

## 2019-07-05 LAB — URINALYSIS, ROUTINE W REFLEX MICROSCOPIC
Bilirubin Urine: NEGATIVE
Hgb urine dipstick: NEGATIVE
Leukocytes,Ua: NEGATIVE
Nitrite: NEGATIVE
RBC / HPF: NONE SEEN (ref 0–?)
Specific Gravity, Urine: 1.025 (ref 1.000–1.030)
Total Protein, Urine: NEGATIVE
Urine Glucose: NEGATIVE
Urobilinogen, UA: 0.2 (ref 0.0–1.0)
WBC, UA: NONE SEEN (ref 0–?)
pH: 6.5 (ref 5.0–8.0)

## 2019-07-05 LAB — TSH: TSH: 2.53 u[IU]/mL (ref 0.35–4.50)

## 2019-07-05 LAB — VITAMIN B12: Vitamin B-12: 293 pg/mL (ref 211–911)

## 2019-07-05 NOTE — Assessment & Plan Note (Signed)
stable overall by history and exam, recent data reviewed with pt, and pt to continue medical treatment as before,  to f/u any worsening symptoms or concerns  

## 2019-07-05 NOTE — Progress Notes (Signed)
Subjective:    Patient ID: Anita Schmidt, female    DOB: 1955-08-01, 63 y.o.   MRN: [id number]  HPI  Here for wellness and f/u;  Overall doing ok;  Pt denies Chest pain, worsening SOB, DOE, wheezing, orthopnea, PND, worsening LE edema, palpitations, dizziness or syncope.  Pt denies neurological change such as new headache, facial or extremity weakness.  Pt denies polydipsia, polyuria, or low sugar symptoms. Pt states overall good compliance with treatment and medications, good tolerability, and has been trying to follow appropriate diet.  Pt denies worsening depressive symptoms, suicidal ideation or panic. No fever, night sweats, wt loss, loss of appetite, or other constitutional symptoms.  Pt states good ability with ADL's, has low fall risk, home safety reviewed and adequate, no other significant changes in hearing or vision, and only occasionally active with exercise. Mother died about 1 yr ago, grieving today as she does yearly.  No new complaints.  Had phlebotomy and recent root canal with anxiety situational increase, but now improved Past Medical History:  Diagnosis Date  . Anxiety    severe  . Anxiety disorder   . Fibrocystic breast disease   . Gilbert's syndrome   . Hemochromatosis   . Hyperlipidemia   . Skin cancer, basal cell    nose   Past Surgical History:  Procedure Laterality Date  . BREAST BIOPSY    . colonscopy  2011   Tics ; Dr Sharlett Iles  . DILATION AND CURETTAGE OF UTERUS    . INGUINAL HERNIA REPAIR    . MOHS SURGERY  2012   nose  . TONSILLECTOMY    . Tracer Pellets  2003   in breast; [employer]    reports that she has never smoked. She has never used smokeless tobacco. She reports current alcohol use of about 2.0 standard drinks of alcohol per week. She reports that she does not use drugs. family history includes Alcohol abuse in her mother; Alzheimer's disease in her maternal grandmother and paternal uncle; Anxiety disorder in her brother; Depression in her  brother; Hypertension in her maternal grandmother and mother; Parkinson's disease in her mother; Stroke in her paternal uncle. Allergies  Allergen Reactions  . Latex Itching    Redness & itching Because of a history of documented adverse serious drug reaction;Medi Alert bracelet  is recommended  . Epinephrine     ? jittery  . Effexor [Venlafaxine]     Shaking, elevated BP,full blown panic attack  Hypertension with systolic blood pressure 893 with severe nausea  . Amoxicillin     REACTION: GI UPSET  . Citalopram     ? nausea   Current Outpatient Medications on File Prior to Visit  Medication Sig Dispense Refill  . amLODipine (NORVASC) 2.5 MG tablet Take 1 tablet (2.5 mg total) by mouth daily. 90 tablet 3   No current facility-administered medications on file prior to visit.   Review of Systems Constitutional: Negative for other unusual diaphoresis, sweats, appetite or weight changes HENT: Negative for other worsening hearing loss, ear pain, facial swelling, mouth sores or neck stiffness.   Eyes: Negative for other worsening pain, redness or other visual disturbance.  Respiratory: Negative for other stridor or swelling Cardiovascular: Negative for other palpitations or other chest pain  Gastrointestinal: Negative for worsening diarrhea or loose stools, blood in stool, distention or other pain Genitourinary: Negative for hematuria, flank pain or other change in urine volume.  Musculoskeletal: Negative for myalgias or other joint swelling.  Skin: Negative for  other color change, or other wound or worsening drainage.  Neurological: Negative for other syncope or numbness. Hematological: Negative for other adenopathy or swelling Psychiatric/Behavioral: Negative for hallucinations, other worsening agitation, SI, self-injury, or new decreased concentration All otherwise neg per pt     Objective:   Physical Exam BP 134/84   Pulse 78   Temp 98.5 F (36.9 C) (Oral)   Ht 5' 6"  (1.676  m)   Wt 131 lb (59.4 kg)   LMP 07/19/2008   SpO2 99%   BMI 21.14 kg/m  VS noted,  Constitutional: Pt is oriented to person, place, and time. Appears well-developed and well-nourished, in no significant distress and comfortable Head: Normocephalic and atraumatic  Eyes: Conjunctivae and EOM are normal. Pupils are equal, round, and reactive to light Right Ear: External ear normal without discharge Left Ear: External ear normal without discharge Nose: Nose without discharge or deformity Mouth/Throat: Oropharynx is without other ulcerations and moist  Neck: Normal range of motion. Neck supple. No JVD present. No tracheal deviation present or significant neck LA or mass Cardiovascular: Normal rate, regular rhythm, normal heart sounds and intact distal pulses.   Pulmonary/Chest: WOB normal and breath sounds without rales or wheezing  Abdominal: Soft. Bowel sounds are normal. NT. No HSM  Musculoskeletal: Normal range of motion. Exhibits no edema Lymphadenopathy: Has no other cervical adenopathy.  Neurological: Pt is alert and oriented to person, place, and time. Pt has normal reflexes. No cranial nerve deficit. Motor grossly intact, Gait intact Skin: Skin is warm and dry. No rash noted or new ulcerations Psychiatric:  Has normal mood and affect. Behavior is normal without agitation All otherwise neg per pt Lab Results  Component Value Date   WBC 4.2 06/06/2019   HGB 14.3 06/06/2019   HCT 42.1 06/06/2019   PLT 259 06/06/2019   GLUCOSE 108 (H) 04/27/2019   CHOL 176 05/23/2018   TRIG 50.0 05/23/2018   HDL 68.60 05/23/2018   LDLDIRECT 131.8 08/01/2008   LDLCALC 97 05/23/2018   ALT 13 04/27/2019   AST 17 04/27/2019   NA 141 04/27/2019   K 4.1 04/27/2019   CL 106 04/27/2019   CREATININE 0.76 04/27/2019   BUN 15 04/27/2019   CO2 28 04/27/2019   TSH 3.22 05/23/2018   HGBA1C 5.8 05/23/2018       Assessment & Plan:

## 2019-07-05 NOTE — Patient Instructions (Addendum)
Please continue all other medications as before, and refills have been done if requested.  Please have the pharmacy call with any other refills you may need.  Please continue your efforts at being more active, low cholesterol diet, and weight control.  You are otherwise up to date with prevention measures today.  Please keep your appointments with your specialists as you may have planned  Please go to the LAB in the Basement (turn left off the elevator) for the tests to be done today  You will be contacted by phone if any changes need to be made immediately.  Otherwise, you will receive a letter about your results with an explanation, but please check with MyChart first.  Please remember to sign up for MyChart if you have not done so, as this will be important to you in the future with finding out test results, communicating by private email, and scheduling acute appointments online when needed.  Please return in 1 year for your yearly visit, or sooner if needed

## 2019-07-05 NOTE — Assessment & Plan Note (Signed)

## 2019-07-06 ENCOUNTER — Encounter: Payer: Self-pay | Admitting: Internal Medicine

## 2019-07-10 ENCOUNTER — Encounter: Payer: Self-pay | Admitting: Hematology

## 2019-07-19 ENCOUNTER — Encounter: Payer: Self-pay | Admitting: Internal Medicine

## 2019-07-25 ENCOUNTER — Encounter: Payer: Self-pay | Admitting: Obstetrics and Gynecology

## 2019-07-25 ENCOUNTER — Telehealth: Payer: Self-pay | Admitting: Obstetrics and Gynecology

## 2019-07-25 NOTE — Telephone Encounter (Signed)
Spoke to pt. Pt notified of PCP recommendations. Pt thankful for advice.  Will route to Dr Quincy Simmonds for [facility]. Encounter closed.

## 2019-07-25 NOTE — Telephone Encounter (Signed)
Patient sent the following correspondence through [place].  Dr. Quincy Simmonds,  [place]! I wonder if you could recommend a GP or Internal Medicine doctor to me.  For years I went to Dr. Linna Darner and really miss his attention to detail.  Also, I think very highly of you and appreciate your professional and nurturing approach to providing care. Id like to find someone like you.   The reason for my concern is my low VitD and B12 levels not being properly addressed.  Thanks for any insight. Take care, [place]

## 2019-07-25 NOTE — Telephone Encounter (Signed)
Per review of Epic, Vit D and B12 check by PCP 07/05/19, WNL.    Routing to Dr. Quincy Simmonds to advise on PCP recommendations.

## 2019-07-25 NOTE — Telephone Encounter (Signed)
Patient may wish to see a provider at [facility].  They are a large internal medicine group.

## 2019-08-02 ENCOUNTER — Inpatient Hospital Stay: Payer: Medicare Other | Attending: Hematology

## 2019-08-02 ENCOUNTER — Other Ambulatory Visit: Payer: Self-pay

## 2019-08-02 DIAGNOSIS — F419 Anxiety disorder, unspecified: Secondary | ICD-10-CM | POA: Diagnosis not present

## 2019-08-02 DIAGNOSIS — Z79899 Other long term (current) drug therapy: Secondary | ICD-10-CM | POA: Diagnosis not present

## 2019-08-02 LAB — CBC WITH DIFFERENTIAL (CANCER CENTER ONLY)
Abs Immature Granulocytes: 0.01 10*3/uL (ref 0.00–0.07)
Basophils Absolute: 0 10*3/uL (ref 0.0–0.1)
Basophils Relative: 1 %
Eosinophils Absolute: 0 10*3/uL (ref 0.0–0.5)
Eosinophils Relative: 1 %
HCT: 44.3 % (ref 36.0–46.0)
Hemoglobin: 15 g/dL (ref 12.0–15.0)
Immature Granulocytes: 0 %
Lymphocytes Relative: 34 %
Lymphs Abs: 1.9 10*3/uL (ref 0.7–4.0)
MCH: 32.2 pg (ref 26.0–34.0)
MCHC: 33.9 g/dL (ref 30.0–36.0)
MCV: 95.1 fL (ref 80.0–100.0)
Monocytes Absolute: 0.5 10*3/uL (ref 0.1–1.0)
Monocytes Relative: 9 %
Neutro Abs: 3.1 10*3/uL (ref 1.7–7.7)
Neutrophils Relative %: 55 %
Platelet Count: 286 10*3/uL (ref 150–400)
RBC: 4.66 MIL/uL (ref 3.87–5.11)
RDW: 11.5 % (ref 11.5–15.5)
WBC Count: 5.5 10*3/uL (ref 4.0–10.5)
nRBC: 0 % (ref 0.0–0.2)

## 2019-08-02 LAB — FERRITIN: Ferritin: 60 ng/mL (ref 11–307)

## 2019-08-02 LAB — IRON AND TIBC
Iron: 144 ug/dL — ABNORMAL HIGH (ref 41–142)
Saturation Ratios: 50 % (ref 21–57)
TIBC: 289 ug/dL (ref 236–444)
UIBC: 144 ug/dL (ref 120–384)

## 2019-08-07 ENCOUNTER — Telehealth: Payer: Self-pay | Admitting: Hematology

## 2019-08-07 NOTE — Telephone Encounter (Signed)
Scheduled appt per 1/18 sch message - spoke with pt . Pt aware of appts

## 2019-08-13 ENCOUNTER — Other Ambulatory Visit: Payer: Self-pay

## 2019-08-13 ENCOUNTER — Inpatient Hospital Stay: Payer: Medicare Other

## 2019-08-13 DIAGNOSIS — Z79899 Other long term (current) drug therapy: Secondary | ICD-10-CM | POA: Diagnosis not present

## 2019-08-13 NOTE — Patient Instructions (Signed)
Therapeutic Phlebotomy Therapeutic phlebotomy is the planned removal of blood from a person's body for the purpose of treating a medical condition. The procedure is similar to donating blood. Usually, about a pint (470 mL, or 0.47 L) of blood is removed. The average adult has 9-12 pints (4.3-5.7 L) of blood in the body. Therapeutic phlebotomy may be used to treat the following medical conditions:  Hemochromatosis. This is a condition in which the blood contains too much iron.  Polycythemia vera. This is a condition in which the blood contains too many red blood cells.  Porphyria cutanea tarda. This is a disease in which an important part of hemoglobin is not made properly. It results in the buildup of abnormal amounts of porphyrins in the body.  Sickle cell disease. This is a condition in which the red blood cells form an abnormal crescent shape rather than a round shape. Tell a health care provider about:  Any allergies you have.  All medicines you are taking, including vitamins, herbs, eye drops, creams, and over-the-counter medicines.  Any problems you or family members have had with anesthetic medicines.  Any blood disorders you have.  Any surgeries you have had.  Any medical conditions you have.  Whether you are pregnant or may be pregnant. What are the risks? Generally, this is a safe procedure. However, problems may occur, including:  Nausea or light-headedness.  Low blood pressure (hypotension).  Soreness, bleeding, swelling, or bruising at the needle insertion site.  Infection. What happens before the procedure?  Follow instructions from your health care provider about eating or drinking restrictions.  Ask your health care provider about: ? Changing or stopping your regular medicines. This is especially important if you are taking diabetes medicines or blood thinners (anticoagulants). ? Taking medicines such as aspirin and ibuprofen. These medicines can thin your  blood. Do not take these medicines unless your health care provider tells you to take them. ? Taking over-the-counter medicines, vitamins, herbs, and supplements.  Wear clothing with sleeves that can be raised above the elbow.  Plan to have someone take you home from the hospital or clinic.  You may have a blood sample taken.  Your blood pressure, pulse rate, and breathing rate will be measured. What happens during the procedure?   To lower your risk of infection: ? Your health care team will wash or sanitize their hands. ? Your skin will be cleaned with an antiseptic.  You may be given a medicine to numb the area (local anesthetic).  A tourniquet will be placed on your arm.  A needle will be inserted into one of your veins.  Tubing and a collection bag will be attached to that needle.  Blood will flow through the needle and tubing into the collection bag.  The collection bag will be placed lower than your arm to allow gravity to help the flow of blood into the bag.  You may be asked to open and close your hand slowly and continually during the entire collection.  After the specified amount of blood has been removed from your body, the collection bag and tubing will be clamped.  The needle will be removed from your vein.  Pressure will be held on the site of the needle insertion to stop the bleeding.  A bandage (dressing) will be placed over the needle insertion site. The procedure may vary among health care providers and hospitals. What happens after the procedure?  Your blood pressure, pulse rate, and breathing rate will be  measured after the procedure.  You will be encouraged to drink fluids.  Your recovery will be assessed and monitored.  You can return to your normal activities as told by your health care provider. Summary  Therapeutic phlebotomy is the planned removal of blood from a person's body for the purpose of treating a medical condition.  Therapeutic  phlebotomy may be used to treat hemochromatosis, polycythemia vera, porphyria cutanea tarda, or sickle cell disease.  In the procedure, a needle is inserted and about a pint (470 mL, or 0.47 L) of blood is removed. The average adult has 9-12 pints (4.3-5.7 L) of blood in the body.  This is generally a safe procedure, but it can sometimes cause problems such as nausea, light-headedness, or low blood pressure (hypotension). This information is not intended to replace advice given to you by your health care provider. Make sure you discuss any questions you have with your health care provider. Document Released: 12/07/2010 Document Revised: 07/21/2017 Document Reviewed: 07/21/2017 Elsevier Patient Education  2020 [employer].

## 2019-08-13 NOTE — Progress Notes (Signed)
Pt presented today for phlebotomy per MD order. 16 gauge needle was placed and was removed with needle intact after procedure. 520 cc removed and patient tolerated well. Patient discharged in stable condition. VSS.

## 2019-08-24 DIAGNOSIS — D229 Melanocytic nevi, unspecified: Secondary | ICD-10-CM | POA: Diagnosis not present

## 2019-08-24 DIAGNOSIS — L728 Other follicular cysts of the skin and subcutaneous tissue: Secondary | ICD-10-CM | POA: Diagnosis not present

## 2019-08-30 NOTE — Progress Notes (Signed)
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 She was last seen by em 6 months ago and seen by NP Lacie 4 months ago in interim. She presents to the clinic alone. She notes she is doing well. She notes since last visit she had 3 phlebotomies. She notes it went better with a lot of water. She notes she feels her fatigue is more from depression from COVID times and not her hemachromatosis. She notes with acidity she is able to keep going. She notes she has gotten her first COVID19 vaccine and will get her second in 2 weeks.  She notes she has had low B12 with last PCP visit. She has been taking a B12 rich drink. She notes she is willing to take oral B12. She notes she drinks wine less frequently. She is trying to not drink any wine at all.     REVIEW OF SYSTEMS:   Constitutional: Denies fevers, chills or abnormal weight loss Eyes: Denies blurriness of vision Ears, nose, mouth, throat, and face: Denies mucositis or sore throat Respiratory: Denies cough, dyspnea or wheezes Cardiovascular: Denies palpitation, chest discomfort or lower extremity swelling Gastrointestinal:  Denies nausea, heartburn or change in bowel habits Skin: Denies abnormal skin rashes Lymphatics: Denies new lymphadenopathy or easy bruising Neurological:Denies numbness, tingling or new weaknesses Behavioral/Psych: Mood is stable, no new changes  All other systems were reviewed with the patient and are negative.  MEDICAL HISTORY:  Past Medical History:  Diagnosis Date  . Anxiety    severe  . Anxiety disorder   . Fibrocystic breast disease    . Gilbert's syndrome   . Hemochromatosis   . Hyperlipidemia   . Skin cancer, basal cell    nose    SURGICAL HISTORY: Past Surgical History:  Procedure Laterality Date  . BREAST BIOPSY    . colonscopy  2011   Tics ; Dr Sharlett Iles  . DILATION AND CURETTAGE OF UTERUS    . INGUINAL HERNIA REPAIR    . MOHS SURGERY  2012   nose  . TONSILLECTOMY    . Tracer Pellets  2003   in breast; [place]    I have reviewed the social history and family history with the patient and they are unchanged from previous note.  ALLERGIES:  is allergic to latex; epinephrine; effexor [venlafaxine]; amoxicillin; and citalopram.  MEDICATIONS:  Current Outpatient Medications  Medication Sig Dispense Refill  . amLODipine (NORVASC) 2.5 MG tablet Take 1 tablet (2.5 mg total) by mouth daily. 90 tablet 3   No current facility-administered medications for this visit.    PHYSICAL EXAMINATION: ECOG PERFORMANCE STATUS: 0 - Asymptomatic  Vitals:   09/05/19 1348 09/05/19 1351  BP: (!) 152/91 132/89  Pulse: 73   Resp: 20   Temp: 99.1 F (37.3 C)   SpO2: 99%    Filed Weights   09/05/19 1348  Weight: 134 lb 12.8 oz (61.1 kg)    Due to COVID19 we will limit examination to appearance. Patient had no complaints.  GENERAL:alert, no distress and comfortable SKIN: skin color normal, no rashes or significant lesions EYES: normal, Conjunctiva are pink and non-injected, sclera  clear  NEURO: alert & oriented x 3 with fluent speech   LABORATORY DATA:  I have reviewed the data as listed CBC Latest Ref Rng & Units 09/05/2019 08/02/2019 07/05/2019  WBC 4.0 - 10.5 K/uL 5.7 5.5 5.0  Hemoglobin 12.0 - 15.0 g/dL 14.2 15.0 13.7  Hematocrit 36.0 - 46.0 % 42.0 44.3 40.1  Platelets 150 - 400 K/uL 283 286 292.0     CMP Latest Ref Rng & Units 07/05/2019 04/27/2019 05/23/2018  Glucose 70 - 99 mg/dL 80 108(H) 114(H)  BUN 6 - 23 mg/dL 16 15 16   Creatinine 0.40 - 1.20 mg/dL 1.08 0.76 0.80  Sodium 135 - 145 mEq/L 141 141 140   Potassium 3.5 - 5.1 mEq/L 3.9 4.1 4.0  Chloride 96 - 112 mEq/L 104 106 104  CO2 19 - 32 mEq/L 31 28 29   Calcium 8.4 - 10.5 mg/dL 9.5 9.4 9.5  Total Protein 6.0 - 8.3 g/dL 6.7 6.9 6.8  Total Bilirubin 0.2 - 1.2 mg/dL 0.6 0.6 0.8  Alkaline Phos 39 - 117 U/L 55 59 57  AST 0 - 37 U/L 15 17 17   ALT 0 - 35 U/L 11 13 12       RADIOGRAPHIC STUDIES: I have personally reviewed the radiological images as listed and agreed with the findings in the report. No results found.   ASSESSMENT & PLAN:  Anita Schmidt is a 64 y.o. female with    1.HereditaryHemochromatosis, HFE C282Y homozygous mutation(+) -She was diagnosed 10 years ago (in 2010). [name] been followedby her [place]. She has not had phlebotomy before being under my care.Her ferritin has been in the range of 150-290, iron saturation 60-72%, slightly higher lately  -Her US abdomen in 2017 shows normal spleen andfattyliver, otherwise negative.I do not see liver MRI or biopsy has been done before -Her MRI from 04/30/19 shows iron deposition in the liver and spleen, compatible with hemochromatosis. Her 06/2018 ECHO was normal, repeat every 3 years.  -She has been treated with therapeutic Phlebotomies as needed with target ferritin<50 and transferrin saturation less than 50%. She has tolerated well recently with more water intake.  -Labs reviewed, Hg and Hct normal. Iron study still pending  -Continue labs every 3 months, f/u in 9 months    2. Anxiety -She also has white coat syndrome, anxiety of hospitals and claustrophobia.  -Not on medication but does yoga often  -06/2018 ECHO shows EF 65-70%, mild heart murmur -She was able to do MRI without meds recently and phlebotomy is becoming more tolerable for her.  -she is seen by mental health provider at [facility]  -She notes since [place] she has been mildly down and fatigued. She is still able to do things, I encouraged her to continue counseling and being active.    3. Mildly  low Vitamin B12  -Seen on labs with 06/2019 -She has been taking B12 enriched drink. I recommend she start Oral Vit B12 1064mg, she is agreeable.  -She has reduced her wine drinking to a few times a week. I recommend she quit altogether. I notes alcohol can lead to Vit B12 deficiency.    PLAN: -Labs every 3 months, will schedule her phlebotomy if ferritin more than 50, or transferrin saturation more than 50% -F/u in 9 months    No problem-specific Assessment & Plan notes found for this encounter.   No orders of the defined types were placed in this encounter.  All questions were answered. The patient knows to call the clinic with any  problems, questions or concerns. No barriers to learning was detected. The total time spent in the appointment was 20 minutes.     Truitt Merle, MD 09/05/2019   I, Joslyn Devon, am acting as scribe for Truitt Merle, MD.   I have reviewed the above documentation for accuracy and completeness, and I agree with the above.

## 2019-09-05 ENCOUNTER — Other Ambulatory Visit: Payer: Self-pay

## 2019-09-05 ENCOUNTER — Inpatient Hospital Stay: Payer: Medicare Other | Attending: Hematology | Admitting: Hematology

## 2019-09-05 ENCOUNTER — Inpatient Hospital Stay: Payer: Medicare Other

## 2019-09-05 ENCOUNTER — Encounter: Payer: Self-pay | Admitting: Hematology

## 2019-09-05 DIAGNOSIS — R5383 Other fatigue: Secondary | ICD-10-CM | POA: Insufficient documentation

## 2019-09-05 DIAGNOSIS — E538 Deficiency of other specified B group vitamins: Secondary | ICD-10-CM | POA: Insufficient documentation

## 2019-09-05 DIAGNOSIS — Z85828 Personal history of other malignant neoplasm of skin: Secondary | ICD-10-CM | POA: Diagnosis not present

## 2019-09-05 DIAGNOSIS — E785 Hyperlipidemia, unspecified: Secondary | ICD-10-CM | POA: Diagnosis not present

## 2019-09-05 DIAGNOSIS — F419 Anxiety disorder, unspecified: Secondary | ICD-10-CM | POA: Insufficient documentation

## 2019-09-05 DIAGNOSIS — Z79899 Other long term (current) drug therapy: Secondary | ICD-10-CM | POA: Insufficient documentation

## 2019-09-05 LAB — CBC WITH DIFFERENTIAL (CANCER CENTER ONLY)
Abs Immature Granulocytes: 0.01 10*3/uL (ref 0.00–0.07)
Basophils Absolute: 0.1 10*3/uL (ref 0.0–0.1)
Basophils Relative: 1 %
Eosinophils Absolute: 0.1 10*3/uL (ref 0.0–0.5)
Eosinophils Relative: 1 %
HCT: 42 % (ref 36.0–46.0)
Hemoglobin: 14.2 g/dL (ref 12.0–15.0)
Immature Granulocytes: 0 %
Lymphocytes Relative: 39 %
Lymphs Abs: 2.2 10*3/uL (ref 0.7–4.0)
MCH: 31.7 pg (ref 26.0–34.0)
MCHC: 33.8 g/dL (ref 30.0–36.0)
MCV: 93.8 fL (ref 80.0–100.0)
Monocytes Absolute: 0.5 10*3/uL (ref 0.1–1.0)
Monocytes Relative: 9 %
Neutro Abs: 2.9 10*3/uL (ref 1.7–7.7)
Neutrophils Relative %: 50 %
Platelet Count: 283 10*3/uL (ref 150–400)
RBC: 4.48 MIL/uL (ref 3.87–5.11)
RDW: 12 % (ref 11.5–15.5)
WBC Count: 5.7 10*3/uL (ref 4.0–10.5)
nRBC: 0 % (ref 0.0–0.2)

## 2019-09-05 LAB — IRON AND TIBC
Iron: 154 ug/dL — ABNORMAL HIGH (ref 41–142)
Saturation Ratios: 51 % (ref 21–57)
TIBC: 300 ug/dL (ref 236–444)
UIBC: 146 ug/dL (ref 120–384)

## 2019-09-05 LAB — FERRITIN: Ferritin: 41 ng/mL (ref 11–307)

## 2019-09-06 ENCOUNTER — Telehealth: Payer: Self-pay | Admitting: Hematology

## 2019-09-06 NOTE — Telephone Encounter (Signed)
Scheduled appts per 2/17 los.  Sent a message to HIM pool to get a calendar mailed out.

## 2019-09-10 ENCOUNTER — Ambulatory Visit: Payer: Self-pay | Admitting: Internal Medicine

## 2019-10-15 ENCOUNTER — Ambulatory Visit (INDEPENDENT_AMBULATORY_CARE_PROVIDER_SITE_OTHER): Payer: Medicare Other | Admitting: Internal Medicine

## 2019-10-15 ENCOUNTER — Encounter: Payer: Self-pay | Admitting: Internal Medicine

## 2019-10-15 ENCOUNTER — Other Ambulatory Visit: Payer: Self-pay

## 2019-10-15 VITALS — BP 142/86 | HR 72 | Temp 97.5°F | Ht 66.0 in | Wt 130.0 lb

## 2019-10-15 DIAGNOSIS — E7800 Pure hypercholesterolemia, unspecified: Secondary | ICD-10-CM

## 2019-10-15 DIAGNOSIS — G2589 Other specified extrapyramidal and movement disorders: Secondary | ICD-10-CM | POA: Diagnosis not present

## 2019-10-15 DIAGNOSIS — F411 Generalized anxiety disorder: Secondary | ICD-10-CM | POA: Diagnosis not present

## 2019-10-15 DIAGNOSIS — R739 Hyperglycemia, unspecified: Secondary | ICD-10-CM | POA: Diagnosis not present

## 2019-10-15 DIAGNOSIS — F39 Unspecified mood [affective] disorder: Secondary | ICD-10-CM

## 2019-10-15 NOTE — Patient Instructions (Addendum)
Please bring [place] a copy of your living will/health care power of attorney document for your records.  I do recommend that you get your shingrix vaccine series.    Try to get 45-50g of protein per day.

## 2019-10-15 NOTE — Progress Notes (Signed)
Provider:  Rexene Edison. Mariea Clonts, D.O., C.M.D. Location:   [place]  Place of Service:   clinic  Previous PCP: Gayland Curry, DO Patient Care Team: Gayland Curry, DO as PCP - General (Geriatric Medicine)  Extended Emergency Contact Information Primary Emergency Contact: Vincent,Sarah Address: [address]          [place], [place] [address] Johnnette Litter of [place] Phone: [phone] Relation: Friend Secondary Emergency Contact: William Dalton And Lelan Pons Address: [address]          [place], [place] [address] Johnnette Litter of [place] Phone: [phone] Mobile Phone: [phone] Relation: Neighbor  Goals of Care: Advanced Directive information Advanced Directives 10/15/2019  Does Patient Have a Medical Advance Directive? Yes  Does patient want to make changes to medical advance directive? No - Patient declined   Chief Complaint  Patient presents with  . Establish Care    New patient to establish care     HPI: Patient is a 64 y.o. female seen today to establish with [facility].  She follows with [facility] for her anxiety and stress.  She previously saw Dr. Cathlean Cower with [facility].  She also follows with [name] for her hereditary hemochromatosis. She's to get labs every three months and phlebotomy if ferritin over 50 or transferrin sat over 50%.  She will see her Nov.  She has had the hemochromatosis for 8 years.  Her brother has not had it.  Dr. Philip Aspen had diagnosed it at GI.    She is pretty holistic.  She is not big on taking pills.    She has had both covid vaccines.    She does yoga for her anxiety.  She's avoiding medications for that.  She said when she had an infection in her chest, she got panicky.    She was started on B12 and has reduced her wine intake due to low level in Dec of last year.    HTN:  bp a little up today.  She does have white coat htn.  She checks her bp at home.  BP was high at her phlebotomy appt  120s/70s at  home.  Yesterday after gardening it was 138/84.  She is now on just norvasc 2.31m daily.    Taking vitamin D3 for about 6 mos.  Also discussed 15-20 mins sunshine.    Has an issue with her right hand.  She has had difficulty due to using her mouse on the computer.  She has a little weakness.  Bothersome for a couple of years.  She went to Dr from sports medicine and atrophy was noted in the shoulder.  She is going to start doing the yoga and weights.  BMD done in 07/18/17.  She walks 3 miles daily over an hour.    Dr. AHavery [place] over colonoscopies.  Had diverticulosis only when done 03/16/10.    Dr. SQuincy Simmondsdoes her pap smear and orders mammogram.  Last mammogram was last year.    [facility] due to anxiety.  Tries not to pay attention to that.  He wanted her to get a hearing test and she is not ready to got there.    Dr. SKathrin Pennerchecks her eyes April 2021.    KKarma Lewis at [facility] had any serious skin problems.    She's been wNCR Corporationabout getting shingrix.  Discussed series of two vaccines.    She thinks  she's had herpes.  She's avoided testing.  Once or twice a year, she will get a spot on her back with heat and friction from yoga.    Her mother had Parkinson's disease and passed away from bowel obstruction--stayed at [facility].  Her father is 66 and he's in excellent health.     Past Medical History:  Diagnosis Date  . Anxiety    severe  . Anxiety disorder   . Fibrocystic breast disease   . Gilbert's syndrome   . Hemochromatosis   . High blood pressure   . Hyperlipidemia   . Skin cancer, basal cell    nose   Past Surgical History:  Procedure Laterality Date  . BREAST BIOPSY    . colonscopy  2011   Tics ; Dr Sharlett Iles  . DILATION AND CURETTAGE OF UTERUS    . INGUINAL HERNIA REPAIR    . MOHS SURGERY  2012   nose  . TONSILLECTOMY    . Tracer Pellets  2003   in breast; [place]     reports that she has never smoked. She has never used smokeless tobacco. She reports current alcohol use of about 2.0 standard drinks of alcohol per week. She reports that she does not use drugs.  Functional Status Survey:    Family History  Problem Relation Age of Onset  . Hypertension Mother        PMH  . Alcohol abuse Mother   . Parkinson's disease Mother   . Anxiety disorder Brother   . Depression Brother   . Alzheimer's disease Maternal Grandmother   . Hypertension Maternal Grandmother   . Stroke Paternal Uncle   . Alzheimer's disease Paternal Uncle   . Diabetes Neg Hx   . Colon cancer Neg Hx   . Esophageal cancer Neg Hx     Health Maintenance  Topic Date Due  . PNA vac Low Risk Adult (1 of 2 - PCV13) 07/04/2020 (Originally 05/05/2019)  . COLONOSCOPY  03/16/2020  . MAMMOGRAM  04/08/2021  . PAP SMEAR-Modifier  01/29/2022  . TETANUS/TDAP  07/07/2022  . DEXA SCAN  Completed  . Hepatitis C Screening  Completed  . HIV Screening  Completed    Allergies  Allergen Reactions  . Latex Itching    Redness & itching Because of a history of documented adverse serious drug reaction;Medi Alert bracelet  is recommended  . Epinephrine     ? jittery  . Effexor [Venlafaxine]     Shaking, elevated BP,full blown panic attack  Hypertension with systolic blood pressure 270 with severe nausea  . Amoxicillin     REACTION: GI UPSET  . Citalopram     ? nausea    Outpatient Encounter Medications as of 10/15/2019  Medication Sig  . amLODipine (NORVASC) 2.5 MG tablet Take 1 tablet (2.5 mg total) by mouth daily.  . Cholecalciferol (VITAMIN D3) 25 MCG (1000 UT) CAPS Take 1 capsule by mouth daily.  . Cyanocobalamin (VITAMIN B 12 PO) Take 1,000 Units by mouth daily.   No facility-administered encounter medications on file as of 10/15/2019.    Review of Systems  Constitutional: Negative for chills, fever and malaise/fatigue.  HENT: Positive for tinnitus. Negative for congestion and sore  throat.        Corrective lenses  Eyes: Negative for blurred vision.  Respiratory: Negative for cough and shortness of breath.   Cardiovascular: Negative for chest pain, palpitations and leg swelling.       HTN  Gastrointestinal: Negative for  abdominal pain, blood in stool, constipation, diarrhea and melena.  Genitourinary: Negative for dysuria.  Musculoskeletal: Positive for myalgias. Negative for falls and joint pain.       Right neck/shoulder and hand (see hpi)  Neurological: Negative for dizziness, sensory change and loss of consciousness.  Endo/Heme/Allergies: Does not bruise/bleed easily.  Psychiatric/Behavioral: Negative for depression and memory loss. The patient is nervous/anxious. The patient does not have insomnia.     Vitals:   10/15/19 1329  BP: (!) 142/86  Pulse: 72  Temp: (!) 97.5 F (36.4 C)  TempSrc: Temporal  SpO2: 98%  Weight: 130 lb (59 kg)  Height: 5' 6"  (1.676 m)   Body mass index is 20.98 kg/m. Physical Exam Vitals reviewed.  Constitutional:      General: She is not in acute distress.    Appearance: Normal appearance. She is not toxic-appearing.     Comments: Tall thin  HENT:     Head: Normocephalic and atraumatic.     Right Ear: External ear normal.     Left Ear: External ear normal.     Nose:     Comments: Nose and mouth deferred due to covid masking Eyes:     Extraocular Movements: Extraocular movements intact.     Conjunctiva/sclera: Conjunctivae normal.     Pupils: Pupils are equal, round, and reactive to light.  Cardiovascular:     Rate and Rhythm: Normal rate and regular rhythm.     Pulses: Normal pulses.     Heart sounds: Normal heart sounds.  Pulmonary:     Effort: Pulmonary effort is normal.     Breath sounds: Normal breath sounds. No wheezing, rhonchi or rales.  Abdominal:     General: Bowel sounds are normal.     Palpations: Abdomen is soft.     Tenderness: There is no abdominal tenderness. There is no guarding or rebound.    Musculoskeletal:        General: Tenderness present.     Cervical back: Neck supple.     Right lower leg: No edema.     Left lower leg: No edema.     Comments: Tender over right trapezius   Skin:    General: Skin is warm and dry.     Coloration: Skin is pale.  Neurological:     General: No focal deficit present.     Mental Status: She is alert and oriented to person, place, and time.     Cranial Nerves: No cranial nerve deficit.     Sensory: No sensory deficit.     Motor: No weakness.     Coordination: Coordination normal.     Gait: Gait normal.     Deep Tendon Reflexes: Reflexes normal.  Psychiatric:        Mood and Affect: Mood normal.     Labs reviewed: Basic Metabolic Panel: Recent Labs    04/27/19 1129 07/05/19 1412  NA 141 141  K 4.1 3.9  CL 106 104  CO2 28 31  GLUCOSE 108* 80  BUN 15 16  CREATININE 0.76 1.08  CALCIUM 9.4 9.5   Liver Function Tests: Recent Labs    04/27/19 1129 07/05/19 1412  AST 17 15  ALT 13 11  ALKPHOS 59 55  BILITOT 0.6 0.6  PROT 6.9 6.7  ALBUMIN 4.5 4.5   No results for input(s): LIPASE, AMYLASE in the last 8760 hours. No results for input(s): AMMONIA in the last 8760 hours. CBC: Recent Labs    07/05/19 1412 08/02/19  1055 09/05/19 1316  WBC 5.0 5.5 5.7  NEUTROABS 2.5 3.1 2.9  HGB 13.7 15.0 14.2  HCT 40.1 44.3 42.0  MCV 95.1 95.1 93.8  PLT 292.0 286 283   Cardiac Enzymes: No results for input(s): CKTOTAL, CKMB, CKMBINDEX, TROPONINI in the last 8760 hours. BNP: Invalid input(s): POCBNP Lab Results  Component Value Date   HGBA1C 5.4 07/05/2019   Lab Results  Component Value Date   TSH 2.53 07/05/2019   Lab Results  Component Value Date   VITAMINB12 293 07/05/2019   Lab Results  Component Value Date   FOLATE >20.0 ng/mL 02/10/2010   Lab Results  Component Value Date   IRON 154 (H) 09/05/2019   TIBC 300 09/05/2019   FERRITIN 41 09/05/2019    Assessment/Plan 1. Pure hypercholesterolemia - not  recently checked and not on medication - f/u lipids before we meet next - Lipid panel; Future - COMPLETE METABOLIC PANEL WITH GFR; Future  2. Hereditary hemochromatosis ([place]) -followed closely by [name] and also Dr. Havery Moros  - COMPLETE METABOLIC PANEL WITH GFR; Future  3. Generalized anxiety disorder -she is doing her best to manage this conservatively with walking, yoga, relaxation; avoiding medications  4. Hyperglycemia -last sugar average in normal range, cont to monitor annually Lab Results  Component Value Date   HGBA1C 5.4 07/05/2019   5.benign essential hypertension -cont amlodipine just 2.44m daily, walking, relaxation approaches  6. Scapular dyskinesis -cont PT exercises she's been doing; if this is not improving, refer for formal therapy  7. Affective disorder ([place] -based on today's visit, she appears to have anxiety rather than depression, bipolar or other affective disorder -monitor  Go get shingrix at pharmacy Try to increase protein intake to 45-50g per day to maintain muscle mass. Bring a copy of advance directives.  Labs/tests ordered:   Lab Orders     Lipid panel     COMPLETE METABOLIC PANEL WITH GFR  F/u 6 mos med mgt, fasting labs before  Myra Weng L. Katerine Morua, D.O. [facility] [address] [place] [id number] Phone (Mon-Fri 8am-5pm):  [phone] Call:  [phone]& follow prompts after 5pm & weekends Office Phone:  [phone] Fax:  [phone]

## 2019-10-16 DIAGNOSIS — H43812 Vitreous degeneration, left eye: Secondary | ICD-10-CM | POA: Diagnosis not present

## 2019-10-18 DIAGNOSIS — F39 Unspecified mood [affective] disorder: Secondary | ICD-10-CM | POA: Insufficient documentation

## 2019-10-22 DIAGNOSIS — H0011 Chalazion right upper eyelid: Secondary | ICD-10-CM | POA: Diagnosis not present

## 2019-10-29 ENCOUNTER — Other Ambulatory Visit: Payer: Medicare Other

## 2019-10-31 ENCOUNTER — Ambulatory Visit: Payer: Medicare Other | Admitting: Hematology

## 2019-11-19 DIAGNOSIS — H0011 Chalazion right upper eyelid: Secondary | ICD-10-CM | POA: Diagnosis not present

## 2019-12-03 ENCOUNTER — Other Ambulatory Visit: Payer: Self-pay

## 2019-12-03 ENCOUNTER — Inpatient Hospital Stay: Payer: Medicare Other | Attending: Hematology

## 2019-12-03 LAB — CBC WITH DIFFERENTIAL (CANCER CENTER ONLY)
Abs Immature Granulocytes: 0.01 10*3/uL (ref 0.00–0.07)
Basophils Absolute: 0 10*3/uL (ref 0.0–0.1)
Basophils Relative: 1 %
Eosinophils Absolute: 0 10*3/uL (ref 0.0–0.5)
Eosinophils Relative: 1 %
HCT: 44.3 % (ref 36.0–46.0)
Hemoglobin: 15.1 g/dL — ABNORMAL HIGH (ref 12.0–15.0)
Immature Granulocytes: 0 %
Lymphocytes Relative: 41 %
Lymphs Abs: 1.9 10*3/uL (ref 0.7–4.0)
MCH: 31.8 pg (ref 26.0–34.0)
MCHC: 34.1 g/dL (ref 30.0–36.0)
MCV: 93.3 fL (ref 80.0–100.0)
Monocytes Absolute: 0.5 10*3/uL (ref 0.1–1.0)
Monocytes Relative: 10 %
Neutro Abs: 2.2 10*3/uL (ref 1.7–7.7)
Neutrophils Relative %: 47 %
Platelet Count: 243 10*3/uL (ref 150–400)
RBC: 4.75 MIL/uL (ref 3.87–5.11)
RDW: 12 % (ref 11.5–15.5)
WBC Count: 4.6 10*3/uL (ref 4.0–10.5)
nRBC: 0 % (ref 0.0–0.2)

## 2019-12-03 LAB — IRON AND TIBC
Iron: 171 ug/dL — ABNORMAL HIGH (ref 41–142)
Saturation Ratios: 64 % — ABNORMAL HIGH (ref 21–57)
TIBC: 268 ug/dL (ref 236–444)
UIBC: 97 ug/dL — ABNORMAL LOW (ref 120–384)

## 2019-12-03 LAB — FERRITIN: Ferritin: 62 ng/mL (ref 11–307)

## 2019-12-04 ENCOUNTER — Telehealth: Payer: Self-pay | Admitting: Hematology

## 2019-12-04 ENCOUNTER — Telehealth: Payer: Self-pay | Admitting: Emergency Medicine

## 2019-12-04 NOTE — Telephone Encounter (Signed)
Called pt per 5/18 sch message - schedule appt and left message for patient with appt date and time

## 2019-12-04 NOTE — Telephone Encounter (Addendum)
Pt is aware. Scheduling message sent per MD request.   ----- Message from Truitt Merle, MD sent at 12/04/2019 11:40 AM EDT ----- Please let pt know her iron level was above the goal, and please schedule phlebotomy this or next week, thanks   Truitt Merle  12/04/2019

## 2019-12-05 DIAGNOSIS — H52203 Unspecified astigmatism, bilateral: Secondary | ICD-10-CM | POA: Diagnosis not present

## 2019-12-05 DIAGNOSIS — H43812 Vitreous degeneration, left eye: Secondary | ICD-10-CM | POA: Diagnosis not present

## 2019-12-05 DIAGNOSIS — H0011 Chalazion right upper eyelid: Secondary | ICD-10-CM | POA: Diagnosis not present

## 2019-12-05 DIAGNOSIS — H0100A Unspecified blepharitis right eye, upper and lower eyelids: Secondary | ICD-10-CM | POA: Diagnosis not present

## 2019-12-07 ENCOUNTER — Ambulatory Visit (INDEPENDENT_AMBULATORY_CARE_PROVIDER_SITE_OTHER): Payer: Medicare Other | Admitting: Family

## 2019-12-07 ENCOUNTER — Encounter: Payer: Self-pay | Admitting: Family

## 2019-12-07 ENCOUNTER — Other Ambulatory Visit: Payer: Self-pay

## 2019-12-07 VITALS — BP 130/88 | HR 77 | Temp 97.7°F | Resp 16 | Ht 66.0 in | Wt 128.4 lb

## 2019-12-07 DIAGNOSIS — W57XXXA Bitten or stung by nonvenomous insect and other nonvenomous arthropods, initial encounter: Secondary | ICD-10-CM | POA: Diagnosis not present

## 2019-12-07 DIAGNOSIS — L03311 Cellulitis of abdominal wall: Secondary | ICD-10-CM

## 2019-12-07 MED ORDER — DOXYCYCLINE HYCLATE 100 MG PO TABS: 100.0000 mg | ORAL_TABLET | Freq: Two times a day (BID) | ORAL | 0 refills | Status: AC

## 2019-12-07 NOTE — Progress Notes (Signed)
Provider: Dinah Ngetich FNP-C  Gayland Curry, DO  Patient Care Team: Gayland Curry, DO as PCP - General (Geriatric Medicine) Truitt Merle, MD as Consulting Physician (Hematology) Yisroel Ramming, Everardo All, MD as Consulting Physician (Obstetrics and Gynecology) Jerrell Belfast, MD as Consulting Physician (Otolaryngology) Shon Hough, MD as Consulting Physician (Ophthalmology) Warren Danes, PA-C as Physician Assistant (Dermatology) Armbruster, Carlota Raspberry, MD as Consulting Physician (Gastroenterology)  Extended Emergency Contact Information Primary Emergency Contact: Vincent,Sarah Address: [address]          [place], [place] [address] Johnnette Litter of [place] Phone: [phone] Relation: Friend Secondary Emergency Contact: William Dalton And Lelan Pons Address: [address] [place], [place] [address] Johnnette Litter of [place] Phone: [phone] Mobile Phone: [phone] Relation: Neighbor  Code Status:  Full Code  Goals of care: Advanced Directive information Advanced Directives 12/07/2019  Does Patient Have a Medical Advance Directive? Yes  Type of Advance Directive Living will;Healthcare Power of [place];Out of facility DNR (pink MOST or yellow form)  Does patient want to make changes to medical advance directive? No - Patient declined  Copy of [place] in Chart? No - copy requested     Chief Complaint  Patient presents with  . Acute Visit    Bug Bite.on Abdomen x 4days     HPI:  Pt is a 64 y.o. female seen today for an acute visit for evaluation of possible bug bite.she states was working in her garden when she got bit by something.she did see exactly what bit her.she thinks the bite could have been on Sunday 12/02/2019 or Monday 5/17/2021not sure which day.Bite site on left upper abdomen has increased size and red today.Itchiness not bad though states has another place on her lower back that was itchy today.she has  not looked at it.she felt fatigue though attributes it from working on the garden,went shopping and did yoga.thinks she might have pulled a muscle on her left rib cage during yoga.states intercostal muscle is tender to palpation and twisting. She denies any fever,chills,nausea,vomiting or body aches or fatigue.States feels  anxious because she lives by herself " whenever something happens I think I'm gone die".    Past Medical History:  Diagnosis Date  . Anxiety    severe  . Anxiety disorder   . Fibrocystic breast disease   . Gilbert's syndrome   . Hemochromatosis   . High blood pressure   . Hyperlipidemia   . Skin cancer, basal cell    nose   Past Surgical History:  Procedure Laterality Date  . BREAST BIOPSY    . colonscopy  2011   Tics ; Dr Sharlett Iles  . DILATION AND CURETTAGE OF UTERUS    . INGUINAL HERNIA REPAIR    . MOHS SURGERY  2012   nose  . TONSILLECTOMY    . Tracer Pellets  2003   in breast; [facility]    Allergies  Allergen Reactions  . Latex Itching    Redness & itching Because of a history of documented adverse serious drug reaction;Medi Alert bracelet  is recommended  . Epinephrine     ? jittery  . Effexor [Venlafaxine]     Shaking, elevated BP,full blown panic attack  Hypertension with systolic blood pressure [id number] with severe nausea  . Amoxicillin     REACTION: GI UPSET  . Citalopram     ? nausea    Outpatient Encounter Medications as of 12/07/2019  Medication Sig  . amLODipine (NORVASC) 2.5 MG tablet Take 1 tablet (2.5 mg total) by mouth daily.  . Cholecalciferol (VITAMIN D3) 25 MCG (1000 UT) CAPS Take 1 capsule by mouth daily.  . Cyanocobalamin (VITAMIN B 12 PO) Take 1,000 Units by mouth daily.   No facility-administered encounter medications on file as of 12/07/2019.    Review of Systems  Constitutional: Positive for fatigue. Negative for appetite change, chills and fever.  Respiratory: Negative for cough, choking, chest tightness, shortness of breath  and wheezing.   Cardiovascular: Negative for chest pain, palpitations and leg swelling.  Gastrointestinal: Negative for abdominal distention, abdominal pain, diarrhea, nausea and vomiting.  Genitourinary: Negative for decreased urine volume, difficulty urinating, dysuria, flank pain, frequency, hematuria and urgency.  Musculoskeletal: Negative for arthralgias, gait problem and myalgias.  Skin: Negative for color change, pallor and rash.       Red bite left upper abdomen.   Neurological: Negative for dizziness, speech difficulty, weakness, light-headedness, numbness and headaches.  Psychiatric/Behavioral: Negative for sleep disturbance.       Hx anxiety     Immunization History  Administered Date(s) Administered  . Moderna SARS-COVID-2 Vaccination 08/20/2019, 09/18/2019  . Td 07/19/2002  . Tdap 07/07/2012   Pertinent  Health Maintenance Due  Topic Date Due  . PNA vac Low Risk Adult (1 of 2 - PCV13) 07/04/2020 (Originally 05/05/2019)  . COLONOSCOPY  03/16/2020  . MAMMOGRAM  04/08/2021  . PAP SMEAR-Modifier  01/29/2022  . DEXA SCAN  Completed   Fall Risk  12/07/2019 10/15/2019 07/05/2019 07/03/2018 11/08/2017  Falls in the past year? 0 0 0 0 No  Number falls in past yr: 0 0 - - -  Injury with Fall? 0 0 - - -    Vitals:   12/07/19 1504  BP: 130/88  Pulse: 77  Resp: 16  Temp: 97.7 F (36.5 C)  SpO2: 98%  Weight: 128 lb 6.4 oz (58.2 kg)  Height: 5\' 6"  (1.676 m)   Body mass index is 20.72 kg/m. Physical Exam Vitals reviewed.  Constitutional:      General: She is not in acute distress.    Appearance: She is normal weight. She is not ill-appearing.  HENT:     Head: Normocephalic.  Eyes:     General: No scleral icterus.       Right eye: No discharge.        Left eye: No discharge.     Conjunctiva/sclera: Conjunctivae normal.     Pupils: Pupils are equal, round, and reactive to light.  Cardiovascular:     Rate and Rhythm: Normal rate and regular rhythm.     Pulses:  Normal pulses.     Heart sounds: Normal heart sounds. No murmur. No friction rub. No gallop.   Pulmonary:     Effort: Pulmonary effort is normal. No respiratory distress.     Breath sounds: Normal breath sounds. No wheezing, rhonchi or rales.  Chest:     Chest wall: No tenderness.  Abdominal:     General: Bowel sounds are normal. There is no distension.     Palpations: Abdomen is soft. There is no mass.     Tenderness: There is no abdominal tenderness. There is no right CVA tenderness, left CVA tenderness, guarding or rebound.  Musculoskeletal:        General: No swelling or tenderness. Normal range of motion.     Right lower leg: No edema.     Left lower leg: No edema.  Skin:  General: Skin is warm.     Coloration: Skin is not pale.     Findings: Erythema present. No bruising or rash.       Neurological:     Mental Status: She is oriented to person, place, and time.     Cranial Nerves: No cranial nerve deficit.     Motor: No weakness.     Gait: Gait normal.  Psychiatric:        Mood and Affect: Mood is anxious.        Behavior: Behavior normal.        Thought Content: Thought content normal.        Judgment: Judgment normal.     Labs reviewed: Recent Labs    04/27/19 1129 07/05/19 1412  NA 141 141  K 4.1 3.9  CL 106 104  CO2 28 31  GLUCOSE 108* 80  BUN 15 16  CREATININE 0.76 1.08  CALCIUM 9.4 9.5   Recent Labs    04/27/19 1129 07/05/19 1412  AST 17 15  ALT 13 11  ALKPHOS 59 55  BILITOT 0.6 0.6  PROT 6.9 6.7  ALBUMIN 4.5 4.5   Recent Labs    08/02/19 1055 09/05/19 1316 12/03/19 1106  WBC 5.5 5.7 4.6  NEUTROABS 3.1 2.9 2.2  HGB 15.0 14.2 15.1*  HCT 44.3 42.0 44.3  MCV 95.1 93.8 93.3  PLT 286 283 243   Lab Results  Component Value Date   TSH 2.53 07/05/2019   Lab Results  Component Value Date   HGBA1C 5.4 07/05/2019   Lab Results  Component Value Date   CHOL 198 07/05/2019   HDL 65.20 07/05/2019   LDLCALC 116 (H) 07/05/2019    LDLDIRECT 131.8 08/01/2008   TRIG 83.0 07/05/2019   CHOLHDL 3 07/05/2019    Significant Diagnostic Results in last 30 days:  No results found.  Assessment/Plan 1. Cellulitis of abdominal wall Afebrile. erythema non-tender to touch,not warm and without any drainage as above.Suspicion for insect bite but unclear which type since it was not visualized.will cover with oral Doxycycline for possible tick bite.  - doxycycline (VIBRA-TABS) 100 MG tablet; Take 1 tablet (100 mg total) by mouth 2 (two) times daily for 7 days.  Dispense: 14 tablet; Refill: 0  2. Bug bite, initial encounter Will treat for possible tick bite due to unclear which date she was bitten and what type of insect.  - doxycycline (VIBRA-TABS) 100 MG tablet; Take 1 tablet (100 mg total) by mouth 2 (two) times daily for 7 days.  Dispense: 14 tablet; Refill: 0 - continue with ice compression for 5-10 minutes daily  - Notify provider if symptoms worsen or fail to improve or running a fever > 100.5    Family/ staff Communication: Reviewed plan of care with patient  Labs/tests ordered: None   Next Appointment:   Sandrea Hughs, NP

## 2019-12-07 NOTE — Patient Instructions (Signed)
-   continue with ice compression for 5-10 minutes daily  - Notify provider if symptoms worsen or fail to improve or running a fever > 100.5

## 2019-12-10 ENCOUNTER — Telehealth: Payer: Self-pay

## 2019-12-10 ENCOUNTER — Encounter: Payer: Self-pay | Admitting: Hematology

## 2019-12-10 NOTE — Telephone Encounter (Signed)
Try taking with food or Yorgut.doxycycline is the main medication used for prophylaxis for lyme disease.

## 2019-12-10 NOTE — Telephone Encounter (Signed)
Patient called stating that she saw you on Friday and was given prescription for Doxycycline. She states that it very hard on her (her stomach is really upset). Can you change her to something else or can she just stop taking the medication

## 2019-12-11 ENCOUNTER — Ambulatory Visit: Payer: Medicare Other | Admitting: Sports Medicine

## 2019-12-11 ENCOUNTER — Ambulatory Visit
Admission: RE | Admit: 2019-12-11 | Discharge: 2019-12-11 | Disposition: A | Discharge status: Home / Self Care | Payer: Medicare Other | Source: Ambulatory Visit | Attending: Sports Medicine | Admitting: Sports Medicine

## 2019-12-11 ENCOUNTER — Telehealth: Payer: Self-pay | Admitting: Hematology

## 2019-12-11 ENCOUNTER — Other Ambulatory Visit: Payer: Self-pay

## 2019-12-11 VITALS — BP 120/82 | Ht 66.0 in | Wt 129.0 lb

## 2019-12-11 DIAGNOSIS — R0781 Pleurodynia: Secondary | ICD-10-CM

## 2019-12-11 DIAGNOSIS — S299XXA Unspecified injury of thorax, initial encounter: Secondary | ICD-10-CM | POA: Diagnosis not present

## 2019-12-11 NOTE — Telephone Encounter (Signed)
R/s appt per 5/25 sch message - unable to reach pt  Left message with appt date and time

## 2019-12-11 NOTE — Progress Notes (Signed)
    SUBJECTIVE:   CHIEF COMPLAINT / HPI:   Anita Schmidt is a 64 y.o. female who presents with 1 week of lateral abdominal pain. She states that she was vigorously working out at yoga class, when she was twisting to the left and the right. She thinks this is when her abdomen started hurting.  She describes it more of a "tenderness" than a pain.  It has not been painful enough to cause any difficulty while sleeping or lying on that side.  She only notices it when she twists left or right and sometimes lifts her overhead.   PERTINENT  PMH / PSH: She says that she has borderline for osteopenia.  OBJECTIVE:   BP 120/82   Ht 5' 6"  (1.676 m)   Wt 129 lb (58.5 kg)   LMP 07/19/2008   BMI 20.82 kg/m    Left lateral abdomen: Focally tender over approximately the 10th rib.  There is no overlying ecchymosis or bruising.  Patient has no pain when doing a normal set up.  Remainder of abdomen is nontender, nondistended  ASSESSMENT/PLAN:   Left lateral rib pain Concern for fragility fracture of rib.  Also on the differential is abdominal muscle versus intercostal strain. -Rib series to rule out fracture -If fracture seen, no return to physical activity until healed -If negative for fracture, can return with graded increment in activity  Bonnita Hollow, MD [place]   Patient seen and evaluated with the resident.  I agree with the above plan of care.  X-rays were reviewed.  No fracture seen.  Patient was notified via telephone and reassured that she may resume activity as tolerated.  She will follow-up for ongoing or recalcitrant issues.

## 2019-12-13 ENCOUNTER — Ambulatory Visit: Payer: Medicare Other | Admitting: Family Medicine

## 2019-12-21 ENCOUNTER — Other Ambulatory Visit: Payer: Self-pay

## 2019-12-21 ENCOUNTER — Inpatient Hospital Stay: Payer: Medicare Other | Attending: Hematology

## 2019-12-21 NOTE — Progress Notes (Signed)
Therapeutic phlebotomy procedure performed in [facility] using 16g angiocath. 517g of blood removed. Patient observed 30 minutes post-procedure without incident. Patient was anxious, but overall, tolerated procedure well.

## 2019-12-21 NOTE — Patient Instructions (Signed)

## 2020-01-01 ENCOUNTER — Ambulatory Visit: Payer: Medicare Other | Admitting: Family Medicine

## 2020-01-25 ENCOUNTER — Other Ambulatory Visit: Payer: Self-pay | Admitting: Internal Medicine

## 2020-01-25 NOTE — Telephone Encounter (Signed)
Please refill as per office routine med refill policy (all routine meds refilled for 3 mo or monthly per pt preference up to one year from last visit, then month to month grace period for 3 mo, then further med refills will have to be denied)  

## 2020-01-31 ENCOUNTER — Ambulatory Visit: Payer: 59 | Admitting: Obstetrics and Gynecology

## 2020-02-19 ENCOUNTER — Other Ambulatory Visit: Payer: Self-pay

## 2020-02-19 ENCOUNTER — Ambulatory Visit (INDEPENDENT_AMBULATORY_CARE_PROVIDER_SITE_OTHER): Payer: Medicare Other | Admitting: Obstetrics and Gynecology

## 2020-02-19 ENCOUNTER — Encounter: Payer: Self-pay | Admitting: Obstetrics and Gynecology

## 2020-02-19 VITALS — BP 124/80 | HR 64 | Resp 14 | Ht 66.0 in | Wt 127.6 lb

## 2020-02-19 DIAGNOSIS — Z780 Asymptomatic menopausal state: Secondary | ICD-10-CM | POA: Diagnosis not present

## 2020-02-19 DIAGNOSIS — M8580 Other specified disorders of bone density and structure, unspecified site: Secondary | ICD-10-CM | POA: Diagnosis not present

## 2020-02-19 DIAGNOSIS — Z01419 Encounter for gynecological examination (general) (routine) without abnormal findings: Secondary | ICD-10-CM

## 2020-02-19 NOTE — Patient Instructions (Signed)

## 2020-02-19 NOTE — Progress Notes (Signed)
64 y.o. G9P0020 Single Caucasian female here for annual exam.    Dealing with anxiety.  Doing therapy and yoga.   Up at night to void.  Able to return back to sleep.   Hemochromatosis is controlled with blood removal in 3 months.   Received her Covid vaccine.   PCP:   Cathlean Cower, MD  Patient's last menstrual period was 07/19/2008.           Sexually active: No.  The current method of family planning is post menopausal status.    Exercising: Yes.    walks daily and yoga Smoker:  no  Health Maintenance: Pap:   01/30/19 Neg:Neg HR HPV  12/18/15 Neg:Neg HR HPV  01/11/12 Neg History of abnormal Pap:  no MMG:  04/09/19 BIRADS 1 negative/density b Colonoscopy:  03/16/10 Normal; 02/2020 - patient is due.  BMD:   07/18/17  Result :Osteopenia TDaP:  07/07/12 Gardasil:   n/a HIV and Hep C: 11/08/17 Negative Screening Labs:  PCP   reports that she has never smoked. She has never used smokeless tobacco. She reports current alcohol use of about 2.0 standard drinks of alcohol per week. She reports that she does not use drugs.  Past Medical History:  Diagnosis Date  . Anxiety    severe  . Anxiety disorder   . Fibrocystic breast disease   . Gilbert's syndrome   . Hemochromatosis   . High blood pressure   . Hyperlipidemia   . Skin cancer, basal cell    nose    Past Surgical History:  Procedure Laterality Date  . BREAST BIOPSY    . colonscopy  2011   Tics ; Dr Sharlett Iles  . DILATION AND CURETTAGE OF UTERUS    . INGUINAL HERNIA REPAIR    . MOHS SURGERY  2012   nose  . TONSILLECTOMY    . Tracer Pellets  2003   in breast; [facility]    Current Outpatient Medications  Medication Sig Dispense Refill  . amLODipine (NORVASC) 2.5 MG tablet TAKE 1 TABLET(2.5 MG) BY MOUTH DAILY 90 tablet 1  . Cholecalciferol (VITAMIN D3) 25 MCG (1000 UT) CAPS Take 1 capsule by mouth daily.    . Cyanocobalamin (VITAMIN B 12 PO) Take 1,000 Units by mouth daily.     No current facility-administered  medications for this visit.    Family History  Problem Relation Age of Onset  . Hypertension Mother        PMH  . Alcohol abuse Mother   . Parkinson's disease Mother   . Anxiety disorder Brother   . Depression Brother   . Alzheimer's disease Maternal Grandmother   . Hypertension Maternal Grandmother   . Stroke Paternal Uncle   . Alzheimer's disease Paternal Uncle   . Diabetes Neg Hx   . Colon cancer Neg Hx   . Esophageal cancer Neg Hx     Review of Systems  All other systems reviewed and are negative.   Exam:   BP 124/80   Pulse 64   Resp 14   Ht 5' 6"  (1.676 m)   Wt 127 lb 9.6 oz (57.9 kg)   LMP 07/19/2008   BMI 20.60 kg/m     General appearance: alert, cooperative and appears stated age Head: normocephalic, without obvious abnormality, atraumatic Neck: no adenopathy, supple, symmetrical, trachea midline and thyroid normal to inspection and palpation Lungs: clear to auscultation bilaterally Breasts: normal appearance, no masses or tenderness, No nipple retraction or dimpling, No nipple discharge or bleeding,  No axillary adenopathy Heart: regular rate and rhythm.  Systolic murmur, not new. Abdomen: soft, non-tender; no masses, no organomegaly Extremities: extremities normal, atraumatic, no cyanosis or edema Skin: skin color, texture, turgor normal. No rashes or lesions Lymph nodes: cervical, supraclavicular, and axillary nodes normal. Neurologic: grossly normal  Pelvic: External genitalia:  no lesions              No abnormal inguinal nodes palpated.              Urethra:  normal appearing urethra with no masses, tenderness or lesions              Bartholins and Skenes: normal                 Vagina: normal appearing vagina with normal color and discharge, no lesions              Cervix: no lesions              Pap taken: No. Bimanual Exam:  Uterus:  normal size, contour, position, consistency, mobility, non-tender              Adnexa: no mass, fullness,  tenderness              Rectal exam: Yes.  .  Confirms.              Anus:  normal sphincter tone, no lesions  Chaperone was present for exam.  Assessment:   Well woman visit with normal exam. Cardiac murmur.  Osteopenia.  Hemochromatosis  Plan: Mammogram screening discussed. Self breast awareness reviewed. Pap and HR HPV as above. Guidelines for Calcium, Vitamin D, regular exercise program including cardiovascular and weight bearing exercise. [facility]  Follow up annually and prn.   After visit summary provided.

## 2020-02-20 ENCOUNTER — Telehealth: Payer: Self-pay

## 2020-02-20 ENCOUNTER — Encounter: Payer: Self-pay | Admitting: Internal Medicine

## 2020-02-20 DIAGNOSIS — E2839 Other primary ovarian failure: Secondary | ICD-10-CM

## 2020-02-20 NOTE — Telephone Encounter (Signed)
Patient is calling in regards to bone density order. Patient needs to speak with nurse.

## 2020-02-20 NOTE — Telephone Encounter (Signed)
Spoke with patient. Patient states she called [facility] to schedule BMD was advised she should return to [facility] where previous BMD was completed. Patient requesting an order for BMD at [facility].   Advised patient it is best to have BMD repeated at same location for comparison, however, she can have BMD done at location of her choice. Advised patient if she chooses to have BMD done at [facility] this would have to be ordered by her PCP or [place] provider.   Advised patient BMD order has been placed at [facility] if she chooses to proceed with imaging at [facility].   Patient states she will contact her PCP for BMD order. Patient thankful for call. Is aware to contact the office if any additional questions/assitance.   Routing to provider for final review. Patient is agreeable to disposition. Will close encounter.

## 2020-02-26 ENCOUNTER — Ambulatory Visit (INDEPENDENT_AMBULATORY_CARE_PROVIDER_SITE_OTHER)
Admission: RE | Admit: 2020-02-26 | Discharge: 2020-02-26 | Disposition: A | Discharge status: Home / Self Care | Payer: Medicare Other | Source: Ambulatory Visit | Attending: Internal Medicine | Admitting: Internal Medicine

## 2020-02-26 ENCOUNTER — Other Ambulatory Visit: Payer: Self-pay

## 2020-02-26 DIAGNOSIS — E2839 Other primary ovarian failure: Secondary | ICD-10-CM | POA: Diagnosis not present

## 2020-02-28 ENCOUNTER — Encounter: Payer: Self-pay | Admitting: Internal Medicine

## 2020-02-29 DIAGNOSIS — E538 Deficiency of other specified B group vitamins: Secondary | ICD-10-CM | POA: Diagnosis not present

## 2020-02-29 DIAGNOSIS — R7989 Other specified abnormal findings of blood chemistry: Secondary | ICD-10-CM | POA: Diagnosis not present

## 2020-02-29 DIAGNOSIS — Z1211 Encounter for screening for malignant neoplasm of colon: Secondary | ICD-10-CM | POA: Diagnosis not present

## 2020-02-29 DIAGNOSIS — I10 Essential (primary) hypertension: Secondary | ICD-10-CM | POA: Diagnosis not present

## 2020-03-04 ENCOUNTER — Inpatient Hospital Stay: Payer: Medicare Other | Attending: Hematology

## 2020-03-04 ENCOUNTER — Other Ambulatory Visit: Payer: Self-pay

## 2020-03-04 ENCOUNTER — Other Ambulatory Visit: Payer: Self-pay | Admitting: Obstetrics and Gynecology

## 2020-03-04 DIAGNOSIS — Z1231 Encounter for screening mammogram for malignant neoplasm of breast: Secondary | ICD-10-CM

## 2020-03-04 LAB — CBC WITH DIFFERENTIAL (CANCER CENTER ONLY)
Abs Immature Granulocytes: 0 10*3/uL (ref 0.00–0.07)
Basophils Absolute: 0.1 10*3/uL (ref 0.0–0.1)
Basophils Relative: 1 %
Eosinophils Absolute: 0 10*3/uL (ref 0.0–0.5)
Eosinophils Relative: 1 %
HCT: 42.3 % (ref 36.0–46.0)
Hemoglobin: 14.3 g/dL (ref 12.0–15.0)
Immature Granulocytes: 0 %
Lymphocytes Relative: 28 %
Lymphs Abs: 1.7 10*3/uL (ref 0.7–4.0)
MCH: 32.3 pg (ref 26.0–34.0)
MCHC: 33.8 g/dL (ref 30.0–36.0)
MCV: 95.5 fL (ref 80.0–100.0)
Monocytes Absolute: 0.4 10*3/uL (ref 0.1–1.0)
Monocytes Relative: 7 %
Neutro Abs: 3.7 10*3/uL (ref 1.7–7.7)
Neutrophils Relative %: 63 %
Platelet Count: 266 10*3/uL (ref 150–400)
RBC: 4.43 MIL/uL (ref 3.87–5.11)
RDW: 11.6 % (ref 11.5–15.5)
WBC Count: 5.9 10*3/uL (ref 4.0–10.5)
nRBC: 0 % (ref 0.0–0.2)

## 2020-03-04 LAB — IRON AND TIBC
Iron: 115 ug/dL (ref 41–142)
Saturation Ratios: 39 % (ref 21–57)
TIBC: 298 ug/dL (ref 236–444)
UIBC: 183 ug/dL (ref 120–384)

## 2020-03-04 LAB — FERRITIN: Ferritin: 36 ng/mL (ref 11–307)

## 2020-03-05 ENCOUNTER — Encounter: Payer: Self-pay | Admitting: Hematology

## 2020-03-17 DIAGNOSIS — Z0184 Encounter for antibody response examination: Secondary | ICD-10-CM | POA: Diagnosis not present

## 2020-04-08 ENCOUNTER — Other Ambulatory Visit: Payer: Self-pay | Admitting: Obstetrics and Gynecology

## 2020-04-08 ENCOUNTER — Encounter: Payer: Self-pay | Admitting: Gastroenterology

## 2020-04-08 DIAGNOSIS — Z1231 Encounter for screening mammogram for malignant neoplasm of breast: Secondary | ICD-10-CM

## 2020-04-16 ENCOUNTER — Other Ambulatory Visit: Payer: Self-pay

## 2020-04-16 ENCOUNTER — Ambulatory Visit
Admission: RE | Admit: 2020-04-16 | Discharge: 2020-04-16 | Disposition: A | Discharge status: Home / Self Care | Payer: Medicare Other | Source: Ambulatory Visit

## 2020-04-16 DIAGNOSIS — Z1231 Encounter for screening mammogram for malignant neoplasm of breast: Secondary | ICD-10-CM | POA: Diagnosis not present

## 2020-05-22 ENCOUNTER — Other Ambulatory Visit: Payer: Self-pay

## 2020-05-22 ENCOUNTER — Ambulatory Visit (AMBULATORY_SURGERY_CENTER): Payer: Self-pay

## 2020-05-22 VITALS — Ht 66.0 in | Wt 131.0 lb

## 2020-05-22 DIAGNOSIS — Z1211 Encounter for screening for malignant neoplasm of colon: Secondary | ICD-10-CM

## 2020-05-22 MED ORDER — PLENVU 140 G PO SOLR: 1.0000 | ORAL | 0 refills | Status: DC

## 2020-05-22 NOTE — Progress Notes (Signed)
Patient reports she is unable to swallow the sutab pills and has requested to have alternative prep-Plenvu ordered  No egg or soy allergy known to patient  No issues with past sedation with any surgeries or procedures No intubation problems in the past  No FH of Malignant Hyperthermia No diet pills per patient No home 02 use per patient  No blood thinners per patient  Pt denies issues with constipation  No A fib or A flutter  EMMI video via MyChart  COVID 19 guidelines implemented in [place] today with Pt and RN  Coupon given to pt in [facility] today , Code to Pharmacy  COVID vaccines completed on 09/2019 per pt;  Due to the COVID-19 pandemic we are asking patients to follow these guidelines. Please only bring one care partner. Please be aware that your care partner may wait in the car in the parking lot or if they feel like they will be too hot to wait in the car, they may wait in the lobby on the 4th floor. All care partners are required to wear a mask the entire time (we do not have any that we can provide them), they need to practice social distancing, and we will do a Covid check for all patient's and care partners when you arrive. Also we will check their temperature and your temperature. If the care partner waits in their car they need to stay in the parking lot the entire time and we will call them on their cell phone when the patient is ready for discharge so they can bring the car to the front of the building. Also all patient's will need to wear a mask into building.

## 2020-05-23 ENCOUNTER — Encounter: Payer: Self-pay | Admitting: Gastroenterology

## 2020-05-30 NOTE — Progress Notes (Signed)
[facility]   [phone] Fax:(336) [phone]   Clinic Follow up Note   Patient Care Team: Bernerd Limbo, MD as PCP - General (Family Medicine) Truitt Merle, MD as Consulting Physician (Hematology) Yisroel Ramming, Everardo All, MD as Consulting Physician (Obstetrics and Gynecology) Jerrell Belfast, MD as Consulting Physician (Otolaryngology) Shon Hough, MD as Consulting Physician (Ophthalmology) Warren Danes, PA-C as Physician Assistant (Dermatology) Armbruster, Carlota Raspberry, MD as Consulting Physician (Gastroenterology)  Date of Service:  06/04/2020  CHIEF COMPLAINT: HereditaryHemochromatosis, (+) 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 She was last seen by me 9 months ago. She presents to the clinic alone. She notes she is doing well. She has not needed phlebotomy in the past 6 months. She plans to have colonoscopy in 07/2020. She notes recently she has been drinking wine occasionally and socially.    REVIEW OF SYSTEMS:   Constitutional: Denies fevers, chills or abnormal weight loss Eyes: Denies blurriness of vision Ears, nose, mouth, throat, and face: Denies mucositis or sore throat Respiratory: Denies cough, dyspnea or wheezes Cardiovascular: Denies palpitation, chest discomfort or lower extremity swelling Gastrointestinal:  Denies nausea, heartburn or change in bowel habits Skin: Denies abnormal skin rashes Lymphatics: Denies new lymphadenopathy or easy bruising Neurological:Denies numbness, tingling or new weaknesses Behavioral/Psych: Mood is stable, no new changes  All other systems were reviewed with the patient and are negative.  MEDICAL HISTORY:  Past Medical History:  Diagnosis Date   Anxiety    severe   Anxiety disorder    Fibrocystic breast disease    GERD (gastroesophageal reflux disease)    triggered with anxiety-not  treated with meds   Gilbert's syndrome    Heart murmur    slight murmur dx by MD- no treatment-EKG clear   Hemochromatosis    High blood pressure    on meds   Hyperlipidemia    diet controlled   Skin cancer, basal cell    nose    SURGICAL HISTORY: Past Surgical History:  Procedure Laterality Date   BREAST BIOPSY     colonscopy  2011   Tics ; Dr Sharlett Iles   DILATION AND CURETTAGE OF UTERUS     INGUINAL HERNIA REPAIR     MOHS SURGERY  2012   nose   TONSILLECTOMY     Tracer Pellets  2003   in breast; [place]   WISDOM TOOTH EXTRACTION      I have reviewed the social history and family history with the patient and they are unchanged from previous note.  ALLERGIES:  is allergic to latex, epinephrine, effexor [venlafaxine], amoxicillin, and citalopram.  MEDICATIONS:  Current Outpatient Medications  Medication Sig Dispense Refill   amLODipine (NORVASC) 2.5 MG tablet TAKE 1 TABLET(2.5 MG) BY MOUTH DAILY 90 tablet 1   Cholecalciferol (VITAMIN D3) 25 MCG (1000 UT) CAPS Take 1 capsule by mouth daily.     Cyanocobalamin (VITAMIN B 12 PO) Take 1,000 Units by mouth daily.     PEG-KCl-NaCl-NaSulf-Na Asc-C (PLENVU) 140 g SOLR Take 1 kit by mouth as directed. Manufacturer's coupon Universal coupon code:BIN: P2366821; GROUP: [id number]; PCN: [id number]; ID: [id number]; PAY NO MORE $50; NO prior authorization 1 each 0   No current facility-administered medications for this visit.    PHYSICAL EXAMINATION: ECOG PERFORMANCE STATUS: 0 - Asymptomatic  Vitals:   06/04/20 1130  BP: (!) 151/86  Pulse: 67  Resp: 16  Temp: 97.6 F (36.4 C)  SpO2: 100%   Filed Weights   06/04/20 1130  Weight: 130 lb 14.4 oz (59.4 kg)    Due to COVID19 we will limit examination to appearance. Patient had no complaints.  GENERAL:alert, no distress and comfortable SKIN: skin color normal, no rashes or significant lesions EYES: normal, Conjunctiva are pink and non-injected, sclera clear  NEURO:  alert & oriented x 3 with fluent speech   LABORATORY DATA:  I have reviewed the data as listed CBC Latest Ref Rng & Units 06/04/2020 03/04/2020 12/03/2019  WBC 4.0 - 10.5 K/uL 4.7 5.9 4.6  Hemoglobin 12.0 - 15.0 g/dL 14.2 14.3 15.1(H)  Hematocrit 36 - 46 % 41.7 42.3 44.3  Platelets 150 - 400 K/uL 260 266 243     CMP Latest Ref Rng & Units 07/05/2019 04/27/2019 05/23/2018  Glucose 70 - 99 mg/dL 80 108(H) 114(H)  BUN 6 - 23 mg/dL 16 15 16   Creatinine 0.40 - 1.20 mg/dL 1.08 0.76 0.80  Sodium 135 - 145 mEq/L 141 141 140  Potassium 3.5 - 5.1 mEq/L 3.9 4.1 4.0  Chloride 96 - 112 mEq/L 104 106 104  CO2 19 - 32 mEq/L 31 28 29   Calcium 8.4 - 10.5 mg/dL 9.5 9.4 9.5  Total Protein 6.0 - 8.3 g/dL 6.7 6.9 6.8  Total Bilirubin 0.2 - 1.2 mg/dL 0.6 0.6 0.8  Alkaline Phos 39 - 117 U/L 55 59 57  AST 0 - 37 U/L 15 17 17   ALT 0 - 35 U/L 11 13 12       RADIOGRAPHIC STUDIES: I have personally reviewed the radiological images as listed and agreed with the findings in the report. No results found.   ASSESSMENT & PLAN:  Anita Schmidt is a 64 y.o. 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 Shehas beenfollowedby her [place]. She has not had phlebotomy before being under my care.Her ferritin has been in the range of 150-290, iron saturation 60-72% and higher.  -Her US abdomen in 2017 shows normal spleen andfattyliver, otherwise negative.I do not see liver MRI or biopsy has been done before.  -Her MRI from 04/30/19 shows iron deposition in the liver and spleen, compatible with hemochromatosis. Her 06/2018 ECHO was normal, repeat every 3 years.  -She is currently being treated with therapeutic Phlebotomies as needed with target ferritin<50 and transferrin saturation less than 50%. She has tolerated well recently with more water intake. -She is clinically doing well and has not required phlebotomy in the last 6 months. Labs reviewed,  Hg and HCT normal, Iron 173, saturation 60%, UIBC 114, Ferritin 85. She will proceed with Phlebotomy this week or in 2 weeks.   -Plan to repeat US Liver before next visit. She knows to avoid iron, reduce red meat, little to no alcohol and Tylenol intake.  -Continue labs every 3 months, f/u in 9 months.    2. Anxiety -She also has white coat syndrome, anxiety of hospitals and claustrophobia.  -Not on medication but does yoga often  -06/2018 ECHO shows EF 65-70%, mild heart murmur -She was able to do MRI without meds recently and phlebotomy is becoming more tolerable for her.  -she is seen by mental health provider at [facility]  -She notes since [place] she has been mildly down and fatigued. She is still able to do things, I encouraged her to continue counseling and being active.    3. Mildly low Vitamin B12  -Seen on labs  with 06/2019 -She has been taking B12 enriched drink. I recommend she start Oral Vit B12 103mg, she is agreeable.  -She has reduced her wine drinking to a few times a week. I recommend she quit altogether. I notes alcohol can lead to Vit B12 deficiency.    PLAN: -Today's ferritin is 85, will proceed with Phlebotomy this week or in 2 weeks.  -Labs every 3 months, will schedule her phlebotomy if ferritin more than 50, or transferrin saturation more than 50% -F/u in 9 months with Liver [place] few days before.    No problem-specific Assessment & Plan notes found for this encounter.   Orders Placed This Encounter  Procedures   [place] Complete    Standing Status:   Future    Standing Expiration Date:   06/04/2021    Order Specific Question:   Reason for Exam (SYMPTOM  OR DIAGNOSIS REQUIRED)    Answer:   monitor liver, rule out liver cirrhosis    Order Specific Question:   [facility]    [facility]  All questions were answered. The patient knows to call the clinic with any problems, questions or concerns. No barriers to  learning was detected. The total time spent in the appointment was 25 minutes.     YTruitt Merle MD 06/04/2020   I, AJoslyn Devon am acting as scribe for YTruitt Merle MD.   I have reviewed the above documentation for accuracy and completeness, and I agree with the above.

## 2020-06-02 NOTE — Telephone Encounter (Signed)
Spoke with patient, she states that if she does not have to have a phlebotomy on Thursday then she will try to keep her scheduled appt. Patient would like to give [place] a call on Thursday morning with her decision, she just wanted to make sure that it was okay if she did decided to wait until January. Will await call from patient.

## 2020-06-04 ENCOUNTER — Inpatient Hospital Stay: Payer: Medicare Other

## 2020-06-04 ENCOUNTER — Inpatient Hospital Stay: Payer: Medicare Other | Attending: Hematology | Admitting: Hematology

## 2020-06-04 ENCOUNTER — Telehealth: Payer: Self-pay | Admitting: Hematology

## 2020-06-04 ENCOUNTER — Other Ambulatory Visit: Payer: Self-pay

## 2020-06-04 DIAGNOSIS — E785 Hyperlipidemia, unspecified: Secondary | ICD-10-CM | POA: Insufficient documentation

## 2020-06-04 DIAGNOSIS — R011 Cardiac murmur, unspecified: Secondary | ICD-10-CM | POA: Insufficient documentation

## 2020-06-04 DIAGNOSIS — Z85828 Personal history of other malignant neoplasm of skin: Secondary | ICD-10-CM | POA: Insufficient documentation

## 2020-06-04 DIAGNOSIS — Z79899 Other long term (current) drug therapy: Secondary | ICD-10-CM | POA: Diagnosis not present

## 2020-06-04 DIAGNOSIS — F419 Anxiety disorder, unspecified: Secondary | ICD-10-CM | POA: Diagnosis not present

## 2020-06-04 DIAGNOSIS — E538 Deficiency of other specified B group vitamins: Secondary | ICD-10-CM | POA: Diagnosis not present

## 2020-06-04 DIAGNOSIS — K219 Gastro-esophageal reflux disease without esophagitis: Secondary | ICD-10-CM | POA: Diagnosis not present

## 2020-06-04 LAB — CBC WITH DIFFERENTIAL (CANCER CENTER ONLY)
Abs Immature Granulocytes: 0.01 10*3/uL (ref 0.00–0.07)
Basophils Absolute: 0 10*3/uL (ref 0.0–0.1)
Basophils Relative: 1 %
Eosinophils Absolute: 0.1 10*3/uL (ref 0.0–0.5)
Eosinophils Relative: 2 %
HCT: 41.7 % (ref 36.0–46.0)
Hemoglobin: 14.2 g/dL (ref 12.0–15.0)
Immature Granulocytes: 0 %
Lymphocytes Relative: 39 %
Lymphs Abs: 1.8 10*3/uL (ref 0.7–4.0)
MCH: 31.9 pg (ref 26.0–34.0)
MCHC: 34.1 g/dL (ref 30.0–36.0)
MCV: 93.7 fL (ref 80.0–100.0)
Monocytes Absolute: 0.5 10*3/uL (ref 0.1–1.0)
Monocytes Relative: 11 %
Neutro Abs: 2.3 10*3/uL (ref 1.7–7.7)
Neutrophils Relative %: 47 %
Platelet Count: 260 10*3/uL (ref 150–400)
RBC: 4.45 MIL/uL (ref 3.87–5.11)
RDW: 11.8 % (ref 11.5–15.5)
WBC Count: 4.7 10*3/uL (ref 4.0–10.5)
nRBC: 0 % (ref 0.0–0.2)

## 2020-06-04 LAB — FERRITIN: Ferritin: 85 ng/mL (ref 11–307)

## 2020-06-04 LAB — IRON AND TIBC
Iron: 173 ug/dL — ABNORMAL HIGH (ref 41–142)
Saturation Ratios: 60 % — ABNORMAL HIGH (ref 21–57)
TIBC: 286 ug/dL (ref 236–444)
UIBC: 114 ug/dL — ABNORMAL LOW (ref 120–384)

## 2020-06-04 NOTE — Telephone Encounter (Signed)
Scheduled per 11/17 los. No avs or calendar needed. Pt is mychart active.

## 2020-06-05 ENCOUNTER — Encounter: Payer: Medicare Other | Admitting: Gastroenterology

## 2020-06-05 ENCOUNTER — Encounter: Payer: Self-pay | Admitting: Gastroenterology

## 2020-06-08 ENCOUNTER — Encounter: Payer: Self-pay | Admitting: Hematology

## 2020-06-10 ENCOUNTER — Encounter: Payer: Medicare Other | Admitting: Gastroenterology

## 2020-06-13 ENCOUNTER — Inpatient Hospital Stay: Payer: Medicare Other

## 2020-06-13 ENCOUNTER — Other Ambulatory Visit: Payer: Self-pay

## 2020-06-13 DIAGNOSIS — R011 Cardiac murmur, unspecified: Secondary | ICD-10-CM | POA: Diagnosis not present

## 2020-06-13 DIAGNOSIS — E785 Hyperlipidemia, unspecified: Secondary | ICD-10-CM | POA: Diagnosis not present

## 2020-06-13 DIAGNOSIS — E538 Deficiency of other specified B group vitamins: Secondary | ICD-10-CM | POA: Diagnosis not present

## 2020-06-13 DIAGNOSIS — Z85828 Personal history of other malignant neoplasm of skin: Secondary | ICD-10-CM | POA: Diagnosis not present

## 2020-06-13 DIAGNOSIS — Z79899 Other long term (current) drug therapy: Secondary | ICD-10-CM | POA: Diagnosis not present

## 2020-06-13 DIAGNOSIS — K219 Gastro-esophageal reflux disease without esophagitis: Secondary | ICD-10-CM | POA: Diagnosis not present

## 2020-06-13 NOTE — Patient Instructions (Signed)

## 2020-06-13 NOTE — Progress Notes (Signed)
Anita Schmidt presents today for phlebotomy per MD [facility]ico orders. Phlebotomy procedure started at 1335 and ended at 1339. 525 grams removed. Patient observed for 30 minutes after procedure without any incident. Patient tolerated procedure well.  Able to eat/drink after procedure. 16 G RAC IV needle removed intact.  VS stable. Pt stable at time of d/c, steady gait, ambulatory.

## 2020-06-30 DIAGNOSIS — I10 Essential (primary) hypertension: Secondary | ICD-10-CM | POA: Diagnosis not present

## 2020-06-30 DIAGNOSIS — Z0000 Encounter for general adult medical examination without abnormal findings: Secondary | ICD-10-CM | POA: Diagnosis not present

## 2020-06-30 DIAGNOSIS — Z23 Encounter for immunization: Secondary | ICD-10-CM | POA: Diagnosis not present

## 2020-07-08 ENCOUNTER — Encounter: Payer: Medicare Other | Admitting: Internal Medicine

## 2020-07-14 ENCOUNTER — Other Ambulatory Visit: Payer: Self-pay

## 2020-07-14 ENCOUNTER — Ambulatory Visit (AMBULATORY_SURGERY_CENTER): Payer: Self-pay | Admitting: *Deleted

## 2020-07-14 VITALS — Ht 66.0 in | Wt 131.0 lb

## 2020-07-14 DIAGNOSIS — Z1211 Encounter for screening for malignant neoplasm of colon: Secondary | ICD-10-CM

## 2020-07-14 DIAGNOSIS — M6281 Muscle weakness (generalized): Secondary | ICD-10-CM | POA: Diagnosis not present

## 2020-07-14 NOTE — Progress Notes (Signed)
Completed covid vaccines x3  Pt is aware that care partner will wait in the car during procedure; if they feel like they will be too hot or cold to wait in the car; they may wait in the 4 th floor lobby. Patient is aware to bring only one care partner. We want them to wear a mask (we do not have any that we can provide them), practice social distancing, and we will check their temperatures when they get here.  I did remind the patient that their care partner needs to stay in the parking lot the entire time and have a cell phone available, we will call them when the pt is ready for discharge. Patient will wear mask into building.  Pt's previsit is done over the phone and all paperwork (prep instructions, blank consent form to just read over, pre-procedure acknowledgement form and stamped envelope) sent to patient  Pt has Plenvu already at home   No trouble with anesthesia, difficulty moving neck or hx/fam hx of malignant hyperthermia per pt   No egg or soy allergy  No home oxygen use   No medications for weight loss taken  Pt denies constipation issues  Pt has severe elevator anxiety- requests to walk down the stairs after procedure with assistance

## 2020-07-26 ENCOUNTER — Other Ambulatory Visit: Payer: Self-pay | Admitting: Internal Medicine

## 2020-07-26 NOTE — Telephone Encounter (Signed)
Please refill as per office routine med refill policy (all routine meds refilled for 3 mo or monthly per pt preference up to one year from last visit, then month to month grace period for 3 mo, then further med refills will have to be denied)  

## 2020-07-31 DIAGNOSIS — T7840XA Allergy, unspecified, initial encounter: Secondary | ICD-10-CM | POA: Diagnosis not present

## 2020-08-06 ENCOUNTER — Encounter: Payer: Medicare Other | Admitting: Gastroenterology

## 2020-08-14 ENCOUNTER — Encounter: Payer: Medicare Other | Admitting: Gastroenterology

## 2020-08-28 ENCOUNTER — Encounter: Payer: Self-pay | Admitting: Gastroenterology

## 2020-09-04 ENCOUNTER — Other Ambulatory Visit: Payer: Self-pay

## 2020-09-04 ENCOUNTER — Inpatient Hospital Stay: Payer: Medicare Other | Attending: Hematology

## 2020-09-04 LAB — CBC WITH DIFFERENTIAL (CANCER CENTER ONLY)
Abs Immature Granulocytes: 0.01 10*3/uL (ref 0.00–0.07)
Basophils Absolute: 0 10*3/uL (ref 0.0–0.1)
Basophils Relative: 1 %
Eosinophils Absolute: 0.1 10*3/uL (ref 0.0–0.5)
Eosinophils Relative: 1 %
HCT: 43.7 % (ref 36.0–46.0)
Hemoglobin: 14.9 g/dL (ref 12.0–15.0)
Immature Granulocytes: 0 %
Lymphocytes Relative: 38 %
Lymphs Abs: 1.9 10*3/uL (ref 0.7–4.0)
MCH: 31.8 pg (ref 26.0–34.0)
MCHC: 34.1 g/dL (ref 30.0–36.0)
MCV: 93.2 fL (ref 80.0–100.0)
Monocytes Absolute: 0.5 10*3/uL (ref 0.1–1.0)
Monocytes Relative: 9 %
Neutro Abs: 2.5 10*3/uL (ref 1.7–7.7)
Neutrophils Relative %: 51 %
Platelet Count: 244 10*3/uL (ref 150–400)
RBC: 4.69 MIL/uL (ref 3.87–5.11)
RDW: 11.5 % (ref 11.5–15.5)
WBC Count: 5 10*3/uL (ref 4.0–10.5)
nRBC: 0 % (ref 0.0–0.2)

## 2020-09-04 LAB — IRON AND TIBC
Iron: 170 ug/dL — ABNORMAL HIGH (ref 41–142)
Saturation Ratios: 55 % (ref 21–57)
TIBC: 306 ug/dL (ref 236–444)
UIBC: 137 ug/dL (ref 120–384)

## 2020-09-04 LAB — FERRITIN: Ferritin: 29 ng/mL (ref 11–307)

## 2020-09-07 ENCOUNTER — Encounter: Payer: Self-pay | Admitting: Hematology

## 2020-09-18 ENCOUNTER — Encounter: Payer: Self-pay | Admitting: Gastroenterology

## 2020-09-24 ENCOUNTER — Other Ambulatory Visit: Payer: Self-pay

## 2020-09-24 ENCOUNTER — Encounter: Payer: Self-pay | Admitting: Physician Assistant

## 2020-09-24 ENCOUNTER — Ambulatory Visit: Payer: Medicare Other | Admitting: Physician Assistant

## 2020-09-24 DIAGNOSIS — Z85828 Personal history of other malignant neoplasm of skin: Secondary | ICD-10-CM

## 2020-09-24 DIAGNOSIS — D1800 Hemangioma unspecified site: Secondary | ICD-10-CM | POA: Diagnosis not present

## 2020-09-24 DIAGNOSIS — Z1283 Encounter for screening for malignant neoplasm of skin: Secondary | ICD-10-CM | POA: Diagnosis not present

## 2020-10-03 ENCOUNTER — Encounter: Payer: Medicare Other | Admitting: Gastroenterology

## 2020-10-09 ENCOUNTER — Encounter: Payer: Self-pay | Admitting: Physician Assistant

## 2020-10-09 NOTE — Progress Notes (Signed)
   Follow-Up Visit   Subjective  Anita Schmidt is a 65 y.o. female who presents for the following: Annual Exam (LEFT CHEEK RED AREA PREVIOUS HAD CRUST NOW ITS GONE).   The following portions of the chart were reviewed this encounter and updated as appropriate:  Tobacco  Allergies  Meds  Problems  Med Hx  Surg Hx  Fam Hx      Objective  Well appearing patient in no apparent distress; mood and affect are within normal limits.  A full examination was performed including scalp, head, eyes, ears, nose, lips, neck, chest, axillae, abdomen, back, buttocks, bilateral upper extremities, bilateral lower extremities, hands, feet, fingers, toes, fingernails, and toenails. All findings within normal limits unless otherwise noted below.  Objective  head to toe: Full body skin examination- no atypical moles or non mole skin cancer  Objective  Left Side of nose: White scar- clear  Objective  Right Upper Back: Multiple red raised papule   Assessment & Plan  Encounter for screening for malignant neoplasm of skin head to toe  Yearly skin check  History of basal cell cancer Left Side of nose  Yearly skin check  Hemangioma, unspecified site Right Upper Back  Okay to leave if stable    I, Clancy Leiner, PA-C, have reviewed all documentation's for this visit.  The documentation on 10/09/20 for the exam, diagnosis, procedures and orders are all accurate and complete.

## 2020-10-17 ENCOUNTER — Ambulatory Visit: Payer: Medicare Other | Admitting: Family Medicine

## 2020-11-14 ENCOUNTER — Ambulatory Visit (AMBULATORY_SURGERY_CENTER): Payer: Self-pay | Admitting: *Deleted

## 2020-11-14 ENCOUNTER — Other Ambulatory Visit: Payer: Self-pay

## 2020-11-14 VITALS — Ht 66.0 in | Wt 131.0 lb

## 2020-11-14 DIAGNOSIS — Z1211 Encounter for screening for malignant neoplasm of colon: Secondary | ICD-10-CM

## 2020-11-14 NOTE — Progress Notes (Signed)
  No trouble with anesthesia, denies being told they were difficult to intubate, or hx/fam hx of malignant hyperthermia per pt   No egg or soy allergy  No home oxygen use   No medications for weight loss taken  Pt denies constipation issues  Pt is very anxious at [facility]; has Plenvu already at home  Pt will need to be walked down the stairs after procedure d/t severe elevator phobia

## 2020-11-26 ENCOUNTER — Encounter: Payer: Self-pay | Admitting: Gastroenterology

## 2020-11-27 ENCOUNTER — Encounter: Payer: Self-pay | Admitting: Certified Registered Nurse Anesthetist

## 2020-11-28 ENCOUNTER — Encounter: Payer: Self-pay | Admitting: Gastroenterology

## 2020-11-28 ENCOUNTER — Other Ambulatory Visit: Payer: Self-pay

## 2020-11-28 ENCOUNTER — Ambulatory Visit (AMBULATORY_SURGERY_CENTER): Payer: Medicare Other | Admitting: Gastroenterology

## 2020-11-28 VITALS — BP 174/75 | HR 55 | Temp 100.0°F | Resp 16 | Ht 66.0 in | Wt 131.0 lb

## 2020-11-28 DIAGNOSIS — Z1211 Encounter for screening for malignant neoplasm of colon: Secondary | ICD-10-CM | POA: Diagnosis not present

## 2020-11-28 DIAGNOSIS — D120 Benign neoplasm of cecum: Secondary | ICD-10-CM

## 2020-11-28 MED ORDER — SODIUM CHLORIDE 0.9 % IV SOLN: 500.0000 mL | Freq: Once | INTRAVENOUS | Status: DC

## 2020-11-28 NOTE — Op Note (Signed)
[place] Patient Name: Anita Schmidt Procedure Date: 11/28/2020 9:37 AM MRN: [id number] Endoscopist: Remo Lipps P. Havery Moros , MD Age: 65 Referring MD:  Date of Birth: 04/01/1956 Gender: Female Account #: [id number] Procedure:                Colonoscopy Indications:              Screening for colorectal malignant neoplasm Medicines:                Monitored Anesthesia Care Procedure:                Pre-Anesthesia Assessment:                           - Prior to the procedure, a History and Physical                            was performed, and patient medications and                            allergies were reviewed. The patient's tolerance of                            previous anesthesia was also reviewed. The risks                            and benefits of the procedure and the sedation                            options and risks were discussed with the patient.                            All questions were answered, and informed consent                            was obtained. Prior Anticoagulants: The patient has                            taken no previous anticoagulant or antiplatelet                            agents. ASA Grade Assessment: II - A patient with                            mild systemic disease. After reviewing the risks                            and benefits, the patient was deemed in                            satisfactory condition to undergo the procedure.                           After obtaining informed consent, the colonoscope  was passed under direct vision. Throughout the                            procedure, the patient's blood pressure, pulse, and                            oxygen saturations were monitored continuously. The                            #[id number] was introduced through the anus and                            advanced to the the cecum, identified by                            appendiceal orifice and  ileocecal valve. The                            colonoscopy was performed without difficulty. The                            patient tolerated the procedure well. The quality                            of the bowel preparation was good. The ileocecal                            valve, appendiceal orifice, and rectum were                            photographed. Scope In: 9:50:00 AM Scope Out: 10:13:45 AM Scope Withdrawal Time: 0 hours 19 minutes 16 seconds  Total Procedure Duration: 0 hours 23 minutes 45 seconds  Findings:                 The perianal and digital rectal examinations were                            normal.                           A 4 mm polyp was found in the cecum. The polyp was                            sessile. The polyp was removed with a cold snare.                            Resection and retrieval were complete.                           Two small angiodysplastic lesions were found in the                            sigmoid colon and in the descending colon.  Many medium-mouthed diverticula were found in the                            entire colon.                           Internal hemorrhoids were found during retroflexion.                           The exam was otherwise without abnormality. Complications:            No immediate complications. Estimated blood loss:                            Minimal. Estimated Blood Loss:     Estimated blood loss was minimal. Impression:               - One 4 mm polyp in the cecum, removed with a cold                            snare. Resected and retrieved.                           - Two colonic angiodysplastic lesions.                           - Many diverticuli in the entire examined colon.                           - Internal hemorrhoids.                           - The examination was otherwise normal. Recommendation:           - Patient has a contact number available for                             emergencies. The signs and symptoms of potential                            delayed complications were discussed with the                            patient. Return to normal activities tomorrow.                            Written discharge instructions were provided to the                            patient.                           - Resume previous diet.                           - Continue present medications.                           -  Await pathology results. Remo Lipps P. Mckyle Solanki, MD 11/28/2020 10:18:10 AM This report has been signed electronically.

## 2020-11-28 NOTE — Progress Notes (Signed)
Called to room to assist during endoscopic procedure.  Patient ID and intended procedure confirmed with present staff. Received instructions for my participation in the procedure from the performing physician.  

## 2020-11-28 NOTE — Progress Notes (Signed)
Report given to PACU, vss 

## 2020-11-28 NOTE — Patient Instructions (Signed)
Read all of the handouts given to you by you recovery room nurse.  YOU HAD AN ENDOSCOPIC PROCEDURE TODAY AT [facility]:   Refer to the procedure report that was given to you for any specific questions about what was found during the examination.  If the procedure report does not answer your questions, please call your gastroenterologist to clarify.  If you requested that your care partner not be given the details of your procedure findings, then the procedure report has been included in a sealed envelope for you to review at your convenience later.  YOU SHOULD EXPECT: Some feelings of bloating in the abdomen. Passage of more gas than usual.  Walking can help get rid of the air that was put into your GI tract during the procedure and reduce the bloating. If you had a lower endoscopy (such as a colonoscopy or flexible sigmoidoscopy) you may notice spotting of blood in your stool or on the toilet paper. If you underwent a bowel prep for your procedure, you may not have a normal bowel movement for a few days.  Please Note:  You might notice some irritation and congestion in your nose or some drainage.  This is from the oxygen used during your procedure.  There is no need for concern and it should clear up in a day or so.  SYMPTOMS TO REPORT IMMEDIATELY:   Following lower endoscopy (colonoscopy or flexible sigmoidoscopy):  Excessive amounts of blood in the stool  Significant tenderness or worsening of abdominal pains  Swelling of the abdomen that is new, acute  Fever of 100F or higher   For urgent or emergent issues, a gastroenterologist can be reached at any hour by calling [phone]. Do not use MyChart messaging for urgent concerns.    DIET:  We do recommend a small meal at first, but then you may proceed to your regular diet.  Drink plenty of fluids but you should avoid alcoholic beverages for 24 hours. We suggest a high fiber diet, and dots of water.  ACTIVITY:  You  should plan to take it easy for the rest of today and you should NOT DRIVE or use heavy machinery until tomorrow (because of the sedation medicines used during the test).    FOLLOW UP: Our staff will call the number listed on your records 48-72 hours following your procedure to check on you and address any questions or concerns that you may have regarding the information given to you following your procedure. If we do not reach you, we will leave a message.  We will attempt to reach you two times.  During this call, we will ask if you have developed any symptoms of COVID 19. If you develop any symptoms (ie: fever, flu-like symptoms, shortness of breath, cough etc.) before then, please call [phone].  If you test positive for Covid 19 in the 2 weeks post procedure, please call and report this information to [place].    If any biopsies were taken you will be contacted by phone or by letter within the next 1-3 weeks.  Please call us at [phone] if you have not heard about the biopsies in 3 weeks.    SIGNATURES/CONFIDENTIALITY: You and/or your care partner have signed paperwork which will be entered into your electronic medical record.  These signatures attest to the fact that that the information above on your After Visit Summary has been reviewed and is understood.  Full responsibility of the confidentiality of this discharge information  lies with you and/or your care-partner.

## 2020-11-28 NOTE — Progress Notes (Signed)
Vital signs checked by:CW  The patient states no changes in medical or surgical history since pre-visit screening on 11/14/20.

## 2020-11-28 NOTE — Progress Notes (Signed)
1000 Robinul 0.1 mg IV given due large amount of secretions upon assessment.  Patient experiencing nausea and vomiting.  MD updated and Zofran 4 mg IV given, vss

## 2020-12-02 ENCOUNTER — Inpatient Hospital Stay: Payer: Medicare Other | Attending: Hematology

## 2020-12-02 ENCOUNTER — Other Ambulatory Visit: Payer: Self-pay

## 2020-12-02 ENCOUNTER — Telehealth: Payer: Self-pay

## 2020-12-02 LAB — CBC WITH DIFFERENTIAL (CANCER CENTER ONLY)
Abs Immature Granulocytes: 0.01 10*3/uL (ref 0.00–0.07)
Basophils Absolute: 0.1 10*3/uL (ref 0.0–0.1)
Basophils Relative: 1 %
Eosinophils Absolute: 0.1 10*3/uL (ref 0.0–0.5)
Eosinophils Relative: 1 %
HCT: 40.8 % (ref 36.0–46.0)
Hemoglobin: 14.3 g/dL (ref 12.0–15.0)
Immature Granulocytes: 0 %
Lymphocytes Relative: 38 %
Lymphs Abs: 1.7 10*3/uL (ref 0.7–4.0)
MCH: 32.1 pg (ref 26.0–34.0)
MCHC: 35 g/dL (ref 30.0–36.0)
MCV: 91.7 fL (ref 80.0–100.0)
Monocytes Absolute: 0.5 10*3/uL (ref 0.1–1.0)
Monocytes Relative: 11 %
Neutro Abs: 2.1 10*3/uL (ref 1.7–7.7)
Neutrophils Relative %: 49 %
Platelet Count: 274 10*3/uL (ref 150–400)
RBC: 4.45 MIL/uL (ref 3.87–5.11)
RDW: 11.9 % (ref 11.5–15.5)
WBC Count: 4.4 10*3/uL (ref 4.0–10.5)
nRBC: 0 % (ref 0.0–0.2)

## 2020-12-02 LAB — IRON AND TIBC
Iron: 147 ug/dL — ABNORMAL HIGH (ref 41–142)
Saturation Ratios: 51 % (ref 21–57)
TIBC: 289 ug/dL (ref 236–444)
UIBC: 142 ug/dL (ref 120–384)

## 2020-12-02 LAB — FERRITIN: Ferritin: 65 ng/mL (ref 11–307)

## 2020-12-02 NOTE — Telephone Encounter (Signed)
  Follow up Call-  Call back number 11/28/2020  Post procedure Call Back phone  # [phone]  Permission to leave phone message Yes  Some recent data might be hidden     Patient questions:  Do you have a fever, pain , or abdominal swelling? No. Pain Score  0 *  Have you tolerated food without any problems? Yes.    Have you been able to return to your normal activities? Yes.    Do you have any questions about your discharge instructions: Diet   No. Medications  No. Follow up visit  No.  Do you have questions or concerns about your Care? No.  Actions: * If pain score is 4 or above: No action needed, pain <4.  1. Have you developed a fever since your procedure? no  2.   Have you had an respiratory symptoms (SOB or cough) since your procedure? no  3.   Have you tested positive for COVID 19 since your procedure no  4.   Have you had any family members/close contacts diagnosed with the COVID 19 since your procedure?  no   If yes to any of these questions please route to Joylene John, RN and Joella Prince, RN

## 2020-12-03 ENCOUNTER — Telehealth: Payer: Self-pay

## 2020-12-03 ENCOUNTER — Telehealth: Payer: Self-pay | Admitting: Hematology

## 2020-12-03 NOTE — Telephone Encounter (Signed)
Called left message with pt per provider iron level above goal plz schedule phlebotomy within next few weeks this writer forwards message to scheduling  Pt encouraged to call for any questions concerns or changes

## 2020-12-03 NOTE — Telephone Encounter (Signed)
-----   Message from Truitt Merle, MD sent at 12/02/2020  3:10 PM EDT ----- Please let pt know her iron level is above the goal, and schedule phlebotomy in the next few weeks, thanks   Truitt Merle

## 2020-12-03 NOTE — Telephone Encounter (Signed)
Scheduled appt per 5/18 sch msg. Pt aware.

## 2020-12-22 ENCOUNTER — Inpatient Hospital Stay: Payer: Medicare Other | Attending: Hematology

## 2020-12-22 ENCOUNTER — Other Ambulatory Visit: Payer: Self-pay

## 2020-12-22 NOTE — Patient Instructions (Signed)

## 2020-12-22 NOTE — Progress Notes (Signed)
Pt had breakfast this am & drinking lots of fluids.  Pt doing OK except for anxiety.  Accessed L antecubital with 16 ga Phleb set & obtained 563 gm dark red blood.  Pt tol well & refreshments offered. 30 min observation.  VSS.  D/C to home in no apparent distress.

## 2021-01-07 ENCOUNTER — Ambulatory Visit: Payer: Medicare Other | Admitting: Sports Medicine

## 2021-01-07 ENCOUNTER — Other Ambulatory Visit: Payer: Self-pay

## 2021-01-07 DIAGNOSIS — R29898 Other symptoms and signs involving the musculoskeletal system: Secondary | ICD-10-CM | POA: Diagnosis not present

## 2021-01-07 NOTE — Assessment & Plan Note (Signed)
Likely 2/2 right hip abductor weakness. Positive trendelenburg and weakness on resisted abduction. Provided pt with hip abductor strengthening exercises and right foot lift for leg length discrepancy. Follow up as needed.

## 2021-01-07 NOTE — Progress Notes (Addendum)
    SUBJECTIVE:   CHIEF COMPLAINT / HPI:   Anita Schmidt is a 65 yr old female who presents with leg pain  Right leg pain  Pt reports right anterior leg pain for several months. It radiating down her anterior legs and wraps around her knees. Pain is worsen at night and when she is sitting cross legged. Worse when doing yoga too. She tends to favour the left leg when she is weight bearing as it is naturally stronger. She reports a leg length discrepancy. Does not take analgesia for the pain. Denies recent injuries/falls to the area.  PERTINENT  PMH / PSH: HTN, IBS, ITB syndrome   OBJECTIVE:   BP (!) 154/96   Ht 5' 6"  (1.676 m)   Wt 128 lb (58.1 kg)   LMP 07/19/2008   BMI 20.66 kg/m   Thin older F in NAD  Hip:  - Inspection: No gross deformity, no swelling, erythema, or ecchymosis - Palpation: No TTP, specifically none over greater trochanter - ROM: Normal range of motion on Flexion, extension, abduction, internal and external rotation - Strength: weakness on resisted abduction of right hip  - Neuro/vasc: NV intact distally - Special Tests: Positive Trendelenburg Leg length - shorter by  ` 2 cm on RT  Gait: Trendelenburg gait  ASSESSMENT/PLAN:   Right leg weakness Likely 2/2 right hip abductor weakness. Positive trendelenburg and weakness on resisted abduction. Provided pt with hip abductor strengthening exercises and right foot lift for leg length discrepancy. Follow up as needed.      [name], MD [place]   I observed and examined the patient with the resident and agree with assessment and plan.  Note reviewed and modified by me. KB Fields

## 2021-01-07 NOTE — Patient Instructions (Signed)
Thank you for coming to see me today. It was a pleasure. Today we discussed your right hip pain and weakness. It seems like you have weakness of the buttock muscles which is causing your symptoms. I recommend -hip abductor strengthening exercises -leg lift to wear in right shoe   Please follow-up with Dr Oneida Alar as needed  If you have any questions or concerns, please do not hesitate to call the office.  Best wishes,   Dr Posey Pronto

## 2021-01-31 IMAGING — MG MM DIGITAL SCREENING BILAT W/ CAD
4 series · 4 of 4 positions shown · non-contrast
Comparison: Previous exam(s).

CLINICAL DATA: Screening.

EXAM:
DIGITAL SCREENING BILATERAL MAMMOGRAM WITH CAD

[R CC]
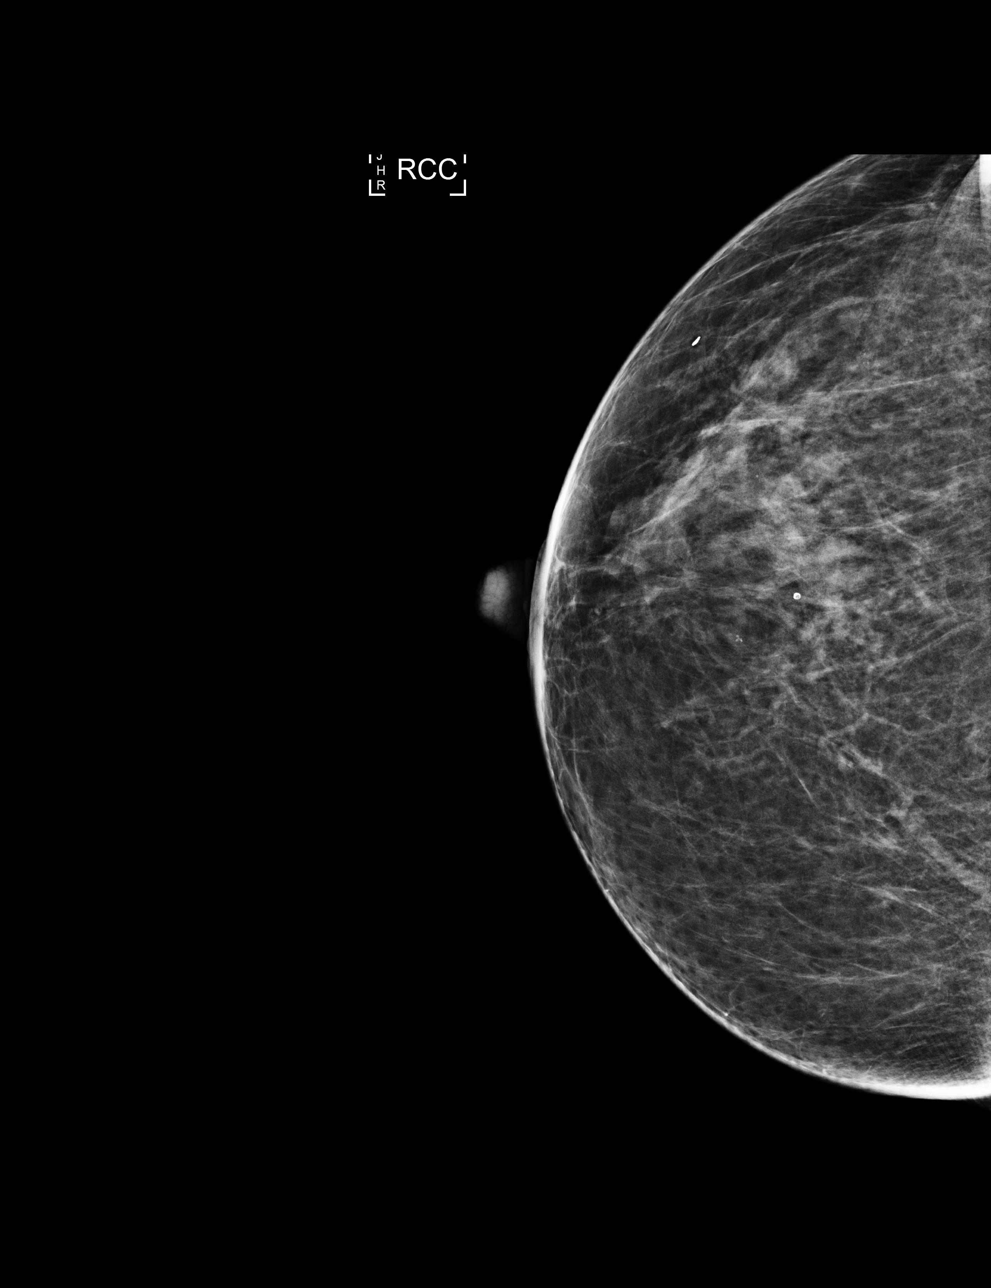

[R MLO]
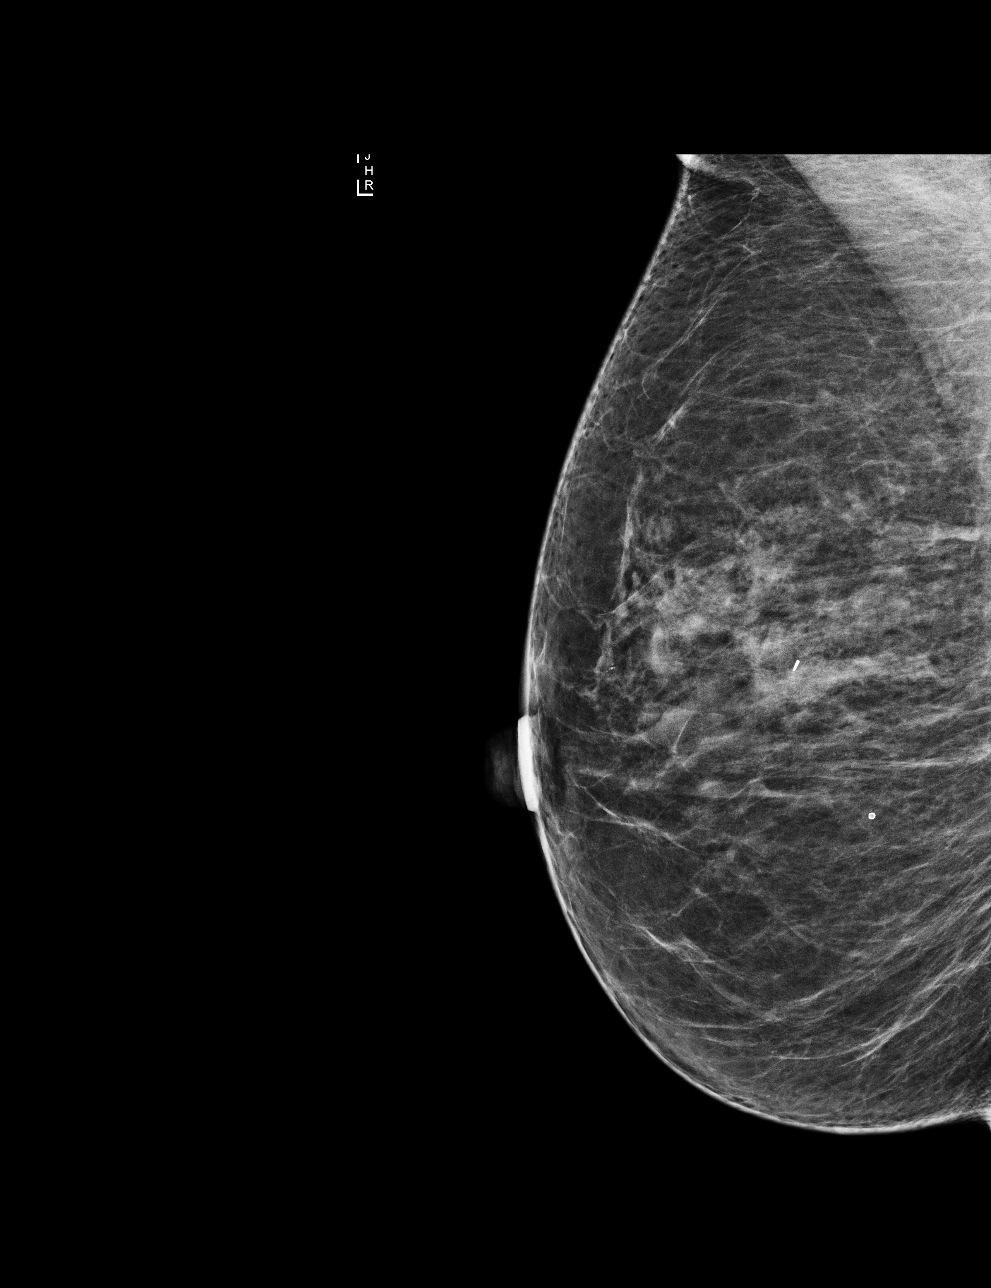

[L MLO]
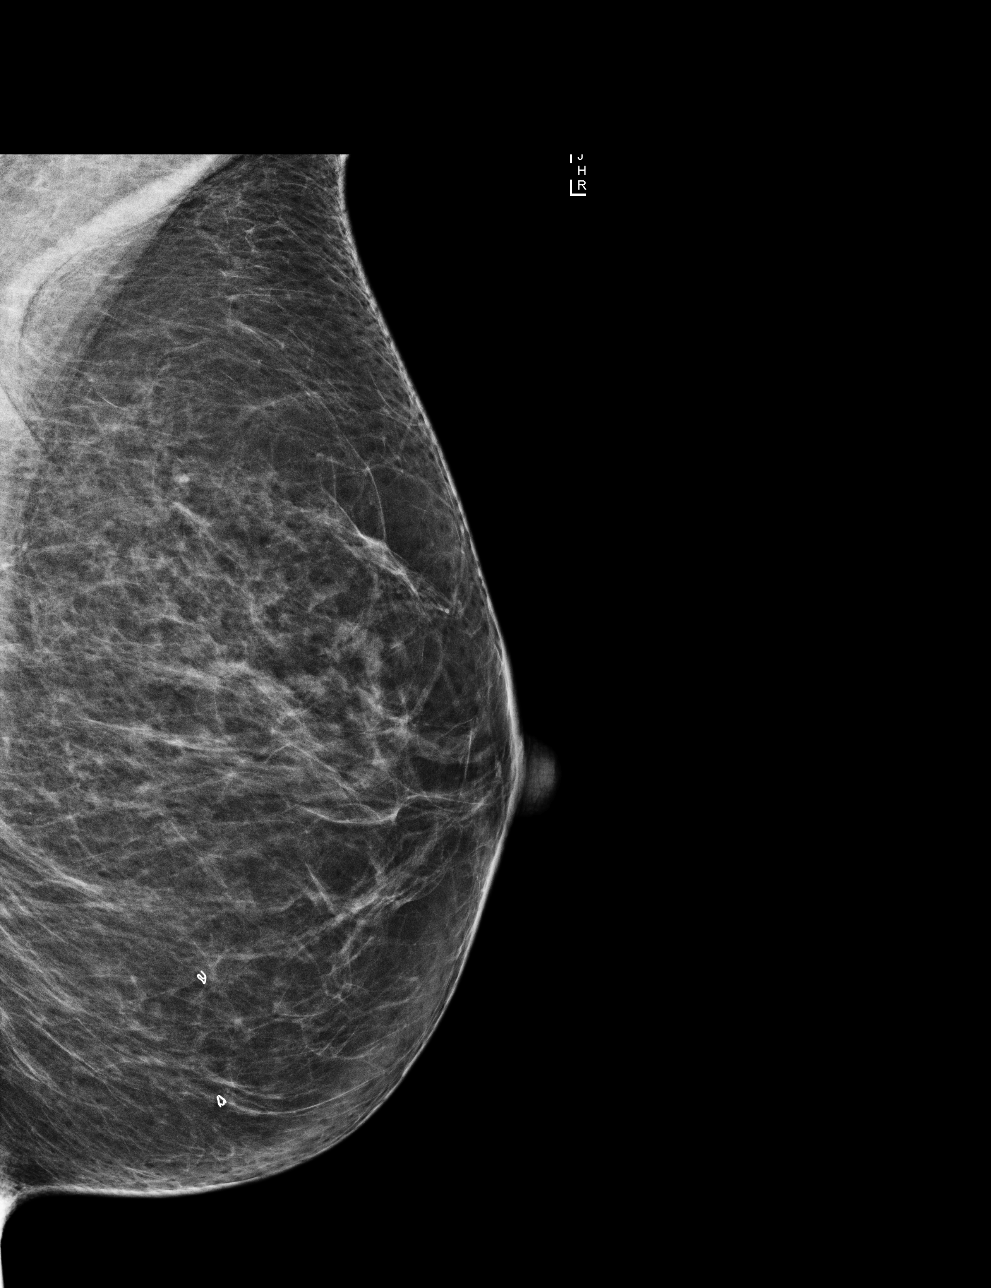

[L CC]
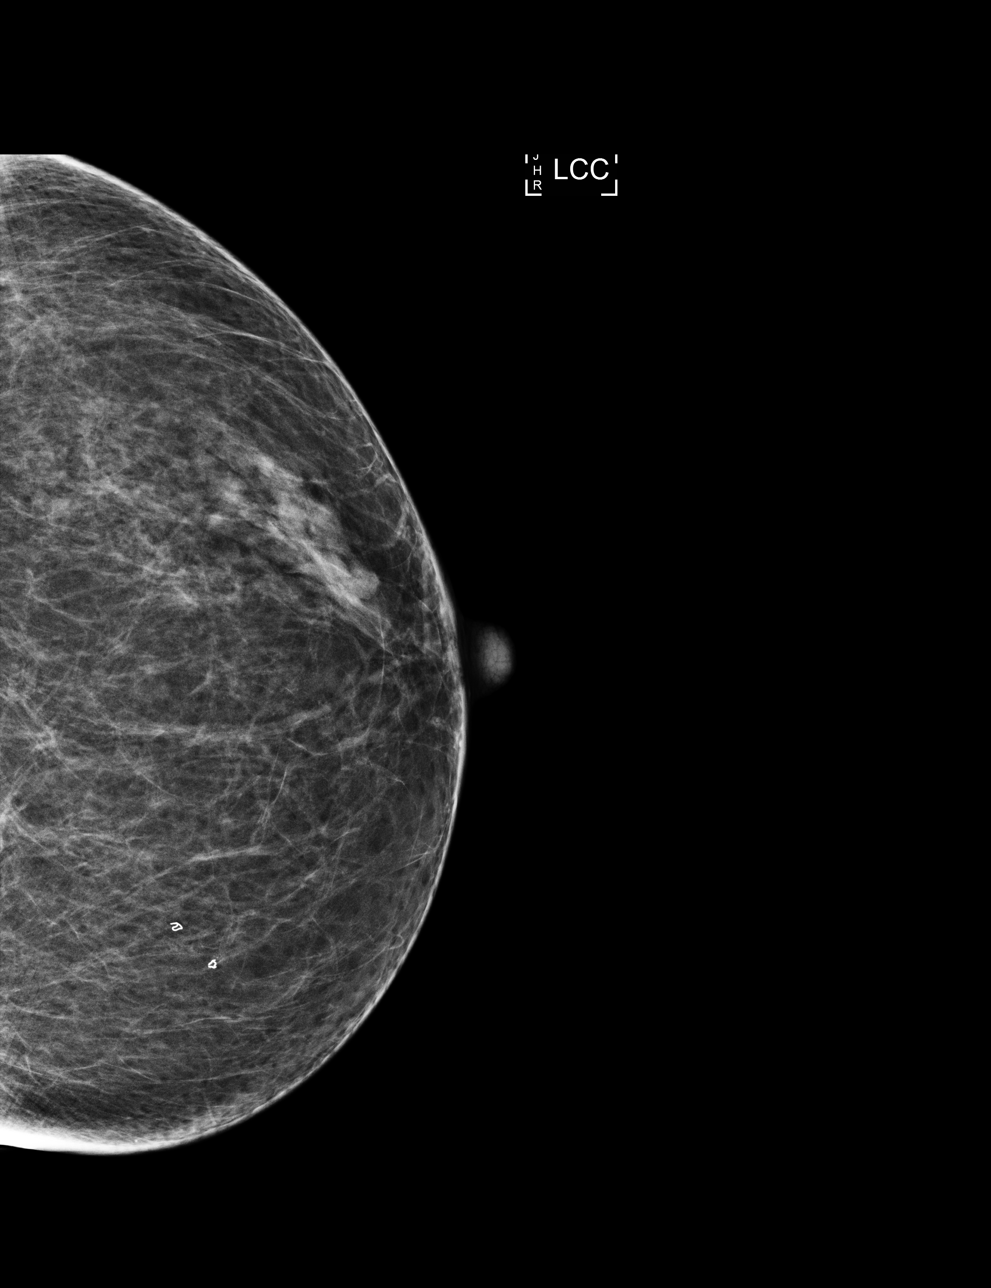

[4 of 4 positions shown; findings below may reference images not displayed]

ACR Breast Density Category b: There are scattered areas of
fibroglandular density.
FINDINGS: There are no findings suspicious for malignancy. Images were
processed with CAD.
IMPRESSION: No mammographic evidence of malignancy. A result letter of this
screening mammogram will be mailed directly to the patient.

RECOMMENDATION:
Screening mammogram in one year. (Code:AS-G-LCT)

BI-RADS CATEGORY  1: Negative.

## 2021-02-20 NOTE — Progress Notes (Deleted)
65 y.o. G60P0020 Single Caucasian female here for annual exam.    PCP:     Patient's last menstrual period was 07/19/2008.           Sexually active: {yes no:314532}  The current method of family planning is post menopausal status.    Exercising: {yes no:314532}  {types:19826} Smoker:  no  Health Maintenance: Pap:    01/30/19 Neg:Neg HR HPV, 12/18/15 Neg:Neg HR HPV,   01/11/12 Neg History of abnormal Pap:  {YES NO:22349} MMG:  04-16-20 3D/Neg/BiRads1 Colonoscopy:  *** BMD:  02-26-20  Result :Osteopenia TDaP: 07-18-12 Gardasil:   no HIV: 11-08-17 Neg Hep C:11-08-17 Neg Screening Labs:  Hb today: ***, Urine today: ***   reports that she has never smoked. She has never used smokeless tobacco. She reports current alcohol use of about 2.0 standard drinks of alcohol per week. She reports that she does not use drugs.  Past Medical History:  Diagnosis Date   Anxiety    severe   Anxiety disorder    Basal cell carcinoma 01/12/2011   BCC LEFT SIDE OF NOSE TX=MOHS   Fibrocystic breast disease    GERD (gastroesophageal reflux disease)    triggered with anxiety-not treated with meds   Gilbert's syndrome    Heart murmur    slight murmur dx by MD- no treatment-EKG clear   Hemochromatosis    High blood pressure    on meds   Hyperlipidemia    diet controlled   Skin cancer, basal cell    nose    Past Surgical History:  Procedure Laterality Date   BREAST BIOPSY     COLONOSCOPY     colonscopy  2011   Tics ; Dr Sharlett Iles   DILATION AND CURETTAGE OF UTERUS     INGUINAL HERNIA REPAIR     MOHS SURGERY  2012   nose   TONSILLECTOMY     Tracer Pellets  2003   in breast; [facility]   WISDOM TOOTH EXTRACTION      Current Outpatient Medications  Medication Sig Dispense Refill   amLODipine (NORVASC) 2.5 MG tablet Take 1 tablet (2.5 mg total) by mouth daily. Overdue for Annual appt w/last must see provider for future refills 30 tablet 0   Cholecalciferol (VITAMIN D3) 25 MCG (1000 UT) CAPS Take 1  capsule by mouth daily.     Cyanocobalamin (VITAMIN B 12 PO) Take 1,000 Units by mouth daily.     No current facility-administered medications for this visit.    Family History  Problem Relation Age of Onset   Hypertension Mother        PMH   Alcohol abuse Mother    Parkinson's disease Mother    Anxiety disorder Brother    Depression Brother    Alzheimer's disease Maternal Grandmother    Hypertension Maternal Grandmother    Stroke Paternal Uncle    Alzheimer's disease Paternal Uncle    Diabetes Neg Hx    Colon cancer Neg Hx    Esophageal cancer Neg Hx    Colon polyps Neg Hx    Rectal cancer Neg Hx    Stomach cancer Neg Hx     Review of Systems  Exam:   LMP 07/19/2008     General appearance: alert, cooperative and appears stated age Head: normocephalic, without obvious abnormality, atraumatic Neck: no adenopathy, supple, symmetrical, trachea midline and thyroid normal to inspection and palpation Lungs: clear to auscultation bilaterally Breasts: normal appearance, no masses or tenderness, No nipple retraction or dimpling,  No nipple discharge or bleeding, No axillary adenopathy Heart: regular rate and rhythm Abdomen: soft, non-tender; no masses, no organomegaly Extremities: extremities normal, atraumatic, no cyanosis or edema Skin: skin color, texture, turgor normal. No rashes or lesions Lymph nodes: cervical, supraclavicular, and axillary nodes normal. Neurologic: grossly normal  Pelvic: External genitalia:  no lesions              No abnormal inguinal nodes palpated.              Urethra:  normal appearing urethra with no masses, tenderness or lesions              Bartholins and Skenes: normal                 Vagina: normal appearing vagina with normal color and discharge, no lesions              Cervix: no lesions              Pap taken: {yes no:314532} Bimanual Exam:  Uterus:  normal size, contour, position, consistency, mobility, non-tender              Adnexa: no  mass, fullness, tenderness              Rectal exam: {yes no:314532}.  Confirms.              Anus:  normal sphincter tone, no lesions  Chaperone was present for exam:  ***  Assessment:   Well woman visit with gynecologic exam.   Plan: Mammogram screening discussed. Self breast awareness reviewed. Pap and HR HPV as above. Guidelines for Calcium, Vitamin D, regular exercise program including cardiovascular and weight bearing exercise.   Follow up annually and prn.   Additional counseling given.  {yes [id number]. _______ minutes face to face time of which over 50% was spent in counseling.    After visit summary provided.

## 2021-02-23 ENCOUNTER — Ambulatory Visit: Payer: Medicare Other | Admitting: Obstetrics and Gynecology

## 2021-03-03 ENCOUNTER — Ambulatory Visit (HOSPITAL_COMMUNITY)
Admission: RE | Admit: 2021-03-03 | Discharge: 2021-03-03 | Disposition: A | Discharge status: Home / Self Care | Payer: Medicare Other | Source: Ambulatory Visit | Attending: Hematology | Admitting: Hematology

## 2021-03-03 ENCOUNTER — Other Ambulatory Visit: Payer: Self-pay

## 2021-03-03 NOTE — Progress Notes (Signed)
[place]   [phone] [phone]   Clinic Follow up Note   Patient Care Team: Bernerd Limbo, MD as PCP - General (Family Medicine) Truitt Merle, MD as Consulting Physician (Hematology) Yisroel Ramming, Everardo All, MD as Consulting Physician (Obstetrics and Gynecology) Jerrell Belfast, MD as Consulting Physician (Otolaryngology) Shon Hough, MD as Consulting Physician (Ophthalmology) Warren Danes, PA-C as Physician Assistant (Dermatology) Armbruster, Carlota Raspberry, MD as Consulting Physician (Gastroenterology)  Date of Service:  03/04/2021  CHIEF COMPLAINT: Hereditary Hemochromatosis, (+) 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 She was last seen by me 9 months ago. She presents to the clinic alone.  She is doing well overall She has mild intermittent fatigue  She had right leg pain in March, which resolved later on  Last phlebotomy was 3 months ago, she tolerated well,   All other systems were reviewed with the patient and are negative.  MEDICAL HISTORY:  Past Medical History:  Diagnosis Date   Anxiety    severe   Anxiety disorder    Basal cell carcinoma 01/12/2011   BCC LEFT SIDE OF NOSE TX=MOHS   Fibrocystic breast disease    GERD (gastroesophageal reflux disease)    triggered with anxiety-not treated with meds   Gilbert's syndrome    Heart murmur    slight murmur dx by MD- no treatment-EKG clear   Hemochromatosis    High blood pressure    on meds   Hyperlipidemia    diet controlled   Skin cancer, basal cell    nose    SURGICAL HISTORY: Past Surgical History:  Procedure Laterality Date   BREAST BIOPSY     COLONOSCOPY     colonscopy  2011   Tics ; Dr Sharlett Iles   DILATION AND CURETTAGE OF UTERUS     INGUINAL HERNIA REPAIR     MOHS SURGERY  2012   nose   TONSILLECTOMY     Tracer Pellets  2003   in breast;  [facility]   WISDOM TOOTH EXTRACTION      I have reviewed the social history and family history with the patient and they are unchanged from previous note.  ALLERGIES:  is allergic to latex, epinephrine, effexor [venlafaxine], amoxicillin, and citalopram.  MEDICATIONS:  Current Outpatient Medications  Medication Sig Dispense Refill   amLODipine (NORVASC) 2.5 MG tablet Take 1 tablet (2.5 mg total) by mouth daily. Overdue for Annual appt w/last must see provider for future refills 30 tablet 0   Cholecalciferol (VITAMIN D3) 25 MCG (1000 UT) CAPS Take 1 capsule by mouth daily.     Cyanocobalamin (VITAMIN B 12 PO) Take 1,000 Units by mouth daily.     No current facility-administered medications for this visit.    PHYSICAL EXAMINATION: ECOG PERFORMANCE STATUS: 0 - Asymptomatic  Vitals:   03/04/21 0940  BP: (!) 164/82  Pulse: 62  Resp: 17  Temp: 97.8 F (36.6 C)  SpO2: 100%    Filed Weights   03/04/21 0940  Weight: 132 lb 8 oz (60.1 kg)     Due to COVID19 we will limit examination to appearance. Patient had no complaints.  GENERAL:alert, no distress and comfortable SKIN: skin color normal, no rashes or significant lesions EYES: normal, Conjunctiva are pink and non-injected, sclera clear  NEURO: alert & oriented x 3 with fluent speech   LABORATORY DATA:  I have  reviewed the data as listed CBC Latest Ref Rng & Units 03/04/2021 12/02/2020 09/04/2020  WBC 4.0 - 10.5 K/uL 4.2 4.4 5.0  Hemoglobin 12.0 - 15.0 g/dL 14.7 14.3 14.9  Hematocrit 36.0 - 46.0 % 43.1 40.8 43.7  Platelets 150 - 400 K/uL 272 274 244     CMP Latest Ref Rng & Units 07/05/2019 04/27/2019 05/23/2018  Glucose 70 - 99 mg/dL 80 108(H) 114(H)  BUN 6 - 23 mg/dL 16 15 16   Creatinine 0.40 - 1.20 mg/dL 1.08 0.76 0.80  Sodium 135 - 145 mEq/L 141 141 140  Potassium 3.5 - 5.1 mEq/L 3.9 4.1 4.0  Chloride 96 - 112 mEq/L 104 106 104  CO2 19 - 32 mEq/L 31 28 29   Calcium 8.4 - 10.5 mg/dL 9.5 9.4 9.5  Total Protein 6.0 - 8.3  g/dL 6.7 6.9 6.8  Total Bilirubin 0.2 - 1.2 mg/dL 0.6 0.6 0.8  Alkaline Phos 39 - 117 U/L 55 59 57  AST 0 - 37 U/L 15 17 17   ALT 0 - 35 U/L 11 13 12       RADIOGRAPHIC STUDIES: I have personally reviewed the radiological images as listed and agreed with the findings in the report. US Abdomen Complete  Result Date: 03/03/2021 CLINICAL DATA:  Hereditary hemochromatosis.  Assess for cirrhosis. EXAM: ABDOMEN ULTRASOUND COMPLETE COMPARISON:  Abdominal MRI 04/30/2019 FINDINGS: Gallbladder: Physiologically distended. No gallstones or wall thickening visualized. No sonographic Murphy sign noted by sonographer. Common bile duct: Diameter: 4-5 mm, normal. Liver: Within normal limits in parenchymal echogenicity. The subcentimeter cyst in the left hepatic lobe on prior MRI is not well seen by ultrasound. No evidence of focal lesion. There is no capsular nodularity. Portal vein is patent on color Doppler imaging with normal direction of blood flow towards the liver. IVC: No abnormality visualized. Pancreas: Visualized portion unremarkable. Spleen: Size and appearance within normal limits.  No splenomegaly. Right Kidney: Length: 9.5 cm. Normal parenchymal echogenicity. No hydronephrosis. No visualized stone or focal lesion. Left Kidney: Length: 10 cm. Normal parenchymal echogenicity. No hydronephrosis. The subcentimeter cyst on prior MRI is not well seen by ultrasound. No visualized stone or focal lesion. Abdominal aorta: No aneurysm visualized. Other findings: No abdominal ascites. IMPRESSION: 1. Normal sonographic appearance of the liver. No sonographic findings of cirrhosis. 2. Small subcentimeter liver and left kidney cysts on prior MRI are not well seen by ultrasound. 3. Unremarkable sonographic appearance of the abdomen. Electronically Signed   By: Keith Rake M.D.   On: 03/03/2021 19:28     ASSESSMENT & PLAN:  Anita Schmidt is a 65 y.o. female with    1. Hereditary Hemochromatosis, HFE C282Y  homozygous mutation (+)  -She was diagnosed in 2010. She has been followed by her GI only. She has not had phlebotomy before being under my care. Her ferritin has been in the range of 150-290, iron saturation 60-72% and higher.  -Her MRI from 04/30/19 shows iron deposition in the liver and spleen, compatible with hemochromatosis. Her 06/2018 ECHO was normal, repeat every 3 years.  -She is currently being treated with therapeutic Phlebotomies as needed with target ferritin<50 and transferrin saturation less than 50%. She has tolerated well recently with more water intake. -Continue labs every 3 months, f/u in 12 months.  -I reviewed her recent abdominal ultrasound from yesterday, which was unremarkable, no evidence of liver cirrhosis.     2. Anxiety -she is seen by [facility] health provider [facility]     3. Mildly low  Vitamin B12  -Seen on labs with 06/2019 -continue oral B12      PLAN:  -lab every 3 months, phlebotomy if ferritin above 50 or iron saturation above 50% -Follow-up in 1 year with Lacie    No problem-specific Assessment & Plan notes found for this encounter.   No orders of the defined types were placed in this encounter.  All questions were answered. The patient knows to call the clinic with any problems, questions or concerns. No barriers to learning was detected. The total time spent in the appointment was 20 minutes.     Truitt Merle, MD 03/04/2021

## 2021-03-04 ENCOUNTER — Inpatient Hospital Stay: Payer: Medicare Other

## 2021-03-04 ENCOUNTER — Inpatient Hospital Stay: Payer: Medicare Other | Attending: Hematology | Admitting: Hematology

## 2021-03-04 DIAGNOSIS — E538 Deficiency of other specified B group vitamins: Secondary | ICD-10-CM | POA: Insufficient documentation

## 2021-03-04 DIAGNOSIS — F419 Anxiety disorder, unspecified: Secondary | ICD-10-CM | POA: Diagnosis not present

## 2021-03-04 DIAGNOSIS — Z79899 Other long term (current) drug therapy: Secondary | ICD-10-CM | POA: Insufficient documentation

## 2021-03-04 DIAGNOSIS — K219 Gastro-esophageal reflux disease without esophagitis: Secondary | ICD-10-CM | POA: Insufficient documentation

## 2021-03-04 DIAGNOSIS — R5383 Other fatigue: Secondary | ICD-10-CM | POA: Insufficient documentation

## 2021-03-04 DIAGNOSIS — N281 Cyst of kidney, acquired: Secondary | ICD-10-CM | POA: Diagnosis not present

## 2021-03-04 DIAGNOSIS — I10 Essential (primary) hypertension: Secondary | ICD-10-CM | POA: Insufficient documentation

## 2021-03-04 DIAGNOSIS — R011 Cardiac murmur, unspecified: Secondary | ICD-10-CM | POA: Insufficient documentation

## 2021-03-04 DIAGNOSIS — E785 Hyperlipidemia, unspecified: Secondary | ICD-10-CM | POA: Diagnosis not present

## 2021-03-04 DIAGNOSIS — Z85828 Personal history of other malignant neoplasm of skin: Secondary | ICD-10-CM | POA: Insufficient documentation

## 2021-03-04 LAB — CBC WITH DIFFERENTIAL (CANCER CENTER ONLY)
Abs Immature Granulocytes: 0.01 10*3/uL (ref 0.00–0.07)
Basophils Absolute: 0 10*3/uL (ref 0.0–0.1)
Basophils Relative: 1 %
Eosinophils Absolute: 0 10*3/uL (ref 0.0–0.5)
Eosinophils Relative: 1 %
HCT: 43.1 % (ref 36.0–46.0)
Hemoglobin: 14.7 g/dL (ref 12.0–15.0)
Immature Granulocytes: 0 %
Lymphocytes Relative: 38 %
Lymphs Abs: 1.6 10*3/uL (ref 0.7–4.0)
MCH: 31.4 pg (ref 26.0–34.0)
MCHC: 34.1 g/dL (ref 30.0–36.0)
MCV: 92.1 fL (ref 80.0–100.0)
Monocytes Absolute: 0.5 10*3/uL (ref 0.1–1.0)
Monocytes Relative: 11 %
Neutro Abs: 2 10*3/uL (ref 1.7–7.7)
Neutrophils Relative %: 49 %
Platelet Count: 272 10*3/uL (ref 150–400)
RBC: 4.68 MIL/uL (ref 3.87–5.11)
RDW: 11.6 % (ref 11.5–15.5)
WBC Count: 4.2 10*3/uL (ref 4.0–10.5)
nRBC: 0 % (ref 0.0–0.2)

## 2021-03-04 LAB — IRON AND TIBC
Iron: 133 ug/dL (ref 41–142)
Saturation Ratios: 46 % (ref 21–57)
TIBC: 291 ug/dL (ref 236–444)
UIBC: 158 ug/dL (ref 120–384)

## 2021-03-04 LAB — FERRITIN: Ferritin: 21 ng/mL (ref 11–307)

## 2021-04-06 ENCOUNTER — Other Ambulatory Visit: Payer: Self-pay

## 2021-04-06 ENCOUNTER — Encounter: Payer: Self-pay | Admitting: Obstetrics and Gynecology

## 2021-04-06 ENCOUNTER — Ambulatory Visit (INDEPENDENT_AMBULATORY_CARE_PROVIDER_SITE_OTHER): Payer: Medicare Other | Admitting: Obstetrics and Gynecology

## 2021-04-06 VITALS — BP 130/82 | HR 70 | Ht 65.5 in | Wt 130.0 lb

## 2021-04-06 DIAGNOSIS — Z01419 Encounter for gynecological examination (general) (routine) without abnormal findings: Secondary | ICD-10-CM

## 2021-04-06 NOTE — Patient Instructions (Signed)

## 2021-04-06 NOTE — Progress Notes (Signed)
65 y.o. G42P0020 Single Caucasian female here for Breast and pelvic exam.   Not sexually active for several years.   PCP:  Bernerd Limbo, MD  Patient's last menstrual period was 07/19/2008.           The current method of family planning is post menopausal status.     Health Maintenance: Pap:  01/30/19 Neg:Neg HR HPV,  12/18/15 Neg:Neg HR HPV, 01/11/12 Neg History of abnormal Pap:  no MMG: 04-16-20 3D/Neg/Birads1 BMD:  02-26-20  Result :Normal--hx osteopenia of spine.    reports that she has never smoked. She has never used smokeless tobacco. She reports that she does not currently use alcohol. She reports that she does not use drugs.  Past Medical History:  Diagnosis Date   Anxiety    severe   Anxiety disorder    Basal cell carcinoma 01/12/2011   BCC LEFT SIDE OF NOSE TX=MOHS   Fibrocystic breast disease    GERD (gastroesophageal reflux disease)    triggered with anxiety-not treated with meds   Gilbert's syndrome    Heart murmur    slight murmur dx by MD- no treatment-EKG clear   Hemochromatosis    High blood pressure    on meds   Hyperlipidemia    diet controlled   Skin cancer, basal cell    nose    Past Surgical History:  Procedure Laterality Date   BREAST BIOPSY     COLONOSCOPY     colonscopy  2011   Tics ; Dr Sharlett Iles   DILATION AND CURETTAGE OF UTERUS     INGUINAL HERNIA REPAIR     MOHS SURGERY  2012   nose   TONSILLECTOMY     Tracer Pellets  2003   in breast; [facility]   WISDOM TOOTH EXTRACTION      Current Outpatient Medications  Medication Sig Dispense Refill   amLODipine (NORVASC) 2.5 MG tablet Take 1 tablet (2.5 mg total) by mouth daily. Overdue for Annual appt w/last must see provider for future refills 30 tablet 0   Cholecalciferol (VITAMIN D3) 25 MCG (1000 UT) CAPS Take 1 capsule by mouth daily.     Cyanocobalamin (VITAMIN B 12 PO) Take 1,000 Units by mouth daily.     No current facility-administered medications for this visit.    Family History   Problem Relation Age of Onset   Hypertension Mother        PMH   Alcohol abuse Mother    Parkinson's disease Mother    Anxiety disorder Brother    Depression Brother    Alzheimer's disease Maternal Grandmother    Hypertension Maternal Grandmother    Stroke Paternal Uncle    Alzheimer's disease Paternal Uncle    Diabetes Neg Hx    Colon cancer Neg Hx    Esophageal cancer Neg Hx    Colon polyps Neg Hx    Rectal cancer Neg Hx    Stomach cancer Neg Hx     Review of Systems  All other systems reviewed and are negative.  Exam:   BP 130/82   Pulse 70   Ht 5' 5.5" (1.664 m)   Wt 130 lb (59 kg)   LMP 07/19/2008   SpO2 98%   BMI 21.30 kg/m     General appearance: alert, cooperative and appears stated age Head: normocephalic, without obvious abnormality, atraumatic Neck: no adenopathy, supple, symmetrical, trachea midline and thyroid normal to inspection and palpation Lungs: clear to auscultation bilaterally Breasts: normal appearance, no masses or  tenderness, No nipple retraction or dimpling, No nipple discharge or bleeding, No axillary adenopathy Heart: regular rate and rhythm.  Systolic murmur. Abdomen: soft, non-tender; no masses, no organomegaly Extremities: extremities normal, atraumatic, no cyanosis or edema Skin: skin color, texture, turgor normal. No rashes or lesions Lymph nodes: cervical, supraclavicular, and axillary nodes normal. Neurologic: grossly normal  Pelvic: External genitalia:  no lesions              No abnormal inguinal nodes palpated.              Urethra:  normal appearing urethra with no masses, tenderness or lesions              Bartholins and Skenes: normal                 Vagina: normal appearing vagina with normal color and discharge, no lesions.  Atrophy noted.              Cervix: no lesions              Pap taken: no. Bimanual Exam:  Uterus:  normal size, contour, position, consistency, mobility, non-tender              Adnexa: no mass,  fullness, tenderness              Rectal exam: yes.  Confirms.              Anus:  normal sphincter tone, no lesions  Chaperone was present for exam:  yes.  Assessment:   Well woman visit with gynecologic exam. Cardiac murmur.  Osteopenia.  Hemochromatosis  Plan: Mammogram screening discussed. Self breast awareness reviewed. Pap and HR HPV not needed.  Guidelines for Calcium, Vitamin D, regular exercise program including cardiovascular and weight bearing exercise. Labs with PCP.  BMD at [facility] 2023.  Follow up annually and prn.   After visit summary provided.

## 2021-04-16 ENCOUNTER — Other Ambulatory Visit: Payer: Self-pay | Admitting: Obstetrics and Gynecology

## 2021-04-16 DIAGNOSIS — Z1231 Encounter for screening mammogram for malignant neoplasm of breast: Secondary | ICD-10-CM

## 2021-04-17 ENCOUNTER — Ambulatory Visit: Payer: Medicare Other

## 2021-04-21 ENCOUNTER — Ambulatory Visit: Payer: Medicare Other | Admitting: Sports Medicine

## 2021-04-21 DIAGNOSIS — M5030 Other cervical disc degeneration, unspecified cervical region: Secondary | ICD-10-CM

## 2021-04-21 NOTE — Assessment & Plan Note (Signed)
I suspect based on her symtpms and exam (limited RT lat bend and scapular winging0 weakness in C5/6 and 6/7 testing that her hand difficulties are related to cervical spine DDD I would like to get CX spine films  For scapular winging we will start with Scap stab series of HEP - 4  I and T stretch Posture  She may see results with this of better hand function  Reck about 6 weeks

## 2021-04-21 NOTE — Progress Notes (Signed)
PCP: Bernerd Limbo, MD  Subjective:   HPI: Patient is a 65 y.o. female here for evaluation of right hand weakness. [name] is right-handed and recently using her left hand to move her right hand.  She has difficulty doing repetitive motion such as beating an egg with her right hand. She has also been told at yoga she has a winged scapula and she is unable to correct it.  Denies any neck pain or any radiculopathy.  Reports hip pain is improving with exercise given for abductor weakness.   Ht 5' 6"  (1.676 m)   Wt 130 lb (59 kg)   LMP 07/19/2008   BMI 20.98 kg/m   [place] Adult Exercise 01/07/2021  Frequency of aerobic exercise (# of days/week) 5  Average time in minutes 40  Frequency of strengthening activities (# of days/week) 2    Objective:  Physical Exam:  Gen: NAD, comfortable in exam room Right Wrist NO TTP over wrist. Inspection yielded no erythema, ecchymosis, bony deformity, or swelling. ROM full with good 4/5 weakness in flexion . Weakness noted in right compared to left OK sign test. No pain or radiculopathy with movements. Provocative testing demonstrates negative Finkelstein's, Phalen's, and Tinel's test. Cervical spine:  - Inspection: no gross deformity or asymmetry, swelling or ecchymosis. No skin changes.  - Palpation: No TTP over the spinous processes, paraspinal muscles    - ROM: ROM of the cervical spine nl in flex/ext/and rotation to left. Limited lateral bend to right side, No pain elicited.  -  Sensation in upper extremity grossly intact to light touch, 2+ radial pulse  - Provocative Testing: Negative Spurling's Test     Assessment & Plan:  1. Hand weakness suspected from cervical origin

## 2021-04-22 NOTE — Addendum Note (Signed)
Addended by: Jolinda Croak E on: 04/22/2021 09:29 AM   Modules accepted: Orders

## 2021-05-13 ENCOUNTER — Other Ambulatory Visit: Payer: Self-pay

## 2021-05-13 ENCOUNTER — Ambulatory Visit
Admission: RE | Admit: 2021-05-13 | Discharge: 2021-05-13 | Disposition: A | Discharge status: Home / Self Care | Payer: Medicare Other | Source: Ambulatory Visit | Attending: Obstetrics and Gynecology | Admitting: Obstetrics and Gynecology

## 2021-05-13 DIAGNOSIS — Z1231 Encounter for screening mammogram for malignant neoplasm of breast: Secondary | ICD-10-CM

## 2021-06-04 ENCOUNTER — Other Ambulatory Visit: Payer: Self-pay

## 2021-06-04 ENCOUNTER — Inpatient Hospital Stay: Payer: Medicare Other | Attending: Nurse Practitioner

## 2021-06-04 LAB — CBC WITH DIFFERENTIAL (CANCER CENTER ONLY)
Abs Immature Granulocytes: 0.01 10*3/uL (ref 0.00–0.07)
Basophils Absolute: 0 10*3/uL (ref 0.0–0.1)
Basophils Relative: 1 %
Eosinophils Absolute: 0.1 10*3/uL (ref 0.0–0.5)
Eosinophils Relative: 2 %
HCT: 41.4 % (ref 36.0–46.0)
Hemoglobin: 14.2 g/dL (ref 12.0–15.0)
Immature Granulocytes: 0 %
Lymphocytes Relative: 36 %
Lymphs Abs: 1.6 10*3/uL (ref 0.7–4.0)
MCH: 31.6 pg (ref 26.0–34.0)
MCHC: 34.3 g/dL (ref 30.0–36.0)
MCV: 92.2 fL (ref 80.0–100.0)
Monocytes Absolute: 0.4 10*3/uL (ref 0.1–1.0)
Monocytes Relative: 10 %
Neutro Abs: 2.2 10*3/uL (ref 1.7–7.7)
Neutrophils Relative %: 51 %
Platelet Count: 287 10*3/uL (ref 150–400)
RBC: 4.49 MIL/uL (ref 3.87–5.11)
RDW: 11.9 % (ref 11.5–15.5)
WBC Count: 4.3 10*3/uL (ref 4.0–10.5)
nRBC: 0 % (ref 0.0–0.2)

## 2021-06-04 LAB — IRON AND TIBC
Iron: 136 ug/dL (ref 41–142)
Saturation Ratios: 50 % (ref 21–57)
TIBC: 273 ug/dL (ref 236–444)
UIBC: 136 ug/dL (ref 120–384)

## 2021-06-04 LAB — FERRITIN: Ferritin: 53 ng/mL (ref 11–307)

## 2021-06-09 ENCOUNTER — Telehealth: Payer: Self-pay | Admitting: Hematology

## 2021-06-09 ENCOUNTER — Telehealth: Payer: Self-pay

## 2021-06-09 NOTE — Telephone Encounter (Signed)
Spoke with pt via telephone regarding her recent iron labs.  Pt's iron was slightly higher than the goal level [name] would like for her to be.  [name] recommends that the pt schedules an appointment for a Therapeutic Phlebotomy.  Transferred pt to the [facility] Scheduling Team so she could make her appt for her phlebotomy.

## 2021-06-09 NOTE — Telephone Encounter (Signed)
Scheduled per sch msg. Called and spoke with patient. Confirmed appt  

## 2021-06-12 ENCOUNTER — Inpatient Hospital Stay: Payer: Medicare Other

## 2021-06-12 ENCOUNTER — Other Ambulatory Visit: Payer: Self-pay

## 2021-06-12 NOTE — Progress Notes (Signed)
Anita Schmidt presents today for phlebotomy per MD orders.Pt very anxious about procedure, requested to be in quiet room. Phlebotomy procedure started at 1600 and ended at 1605, pt clotted. 86 grams removed. Second attempt- Phlebotomy procedure started at 1610 and ended at 1616, pt requested to remove needle as she stated it was hurting. 165 grams removed. No further attempts at patient request. Patient observed for 30 minutes after procedure without any incident. Patient tolerated procedure well. IV needle removed intact.

## 2021-06-12 NOTE — Patient Instructions (Signed)
Therapeutic Phlebotomy °Therapeutic phlebotomy is the planned removal of blood from a person's body for the purpose of treating a medical condition. The procedure is lot like donating blood. Usually, about a pint (470 mL, or 0.47 L) of blood is removed. The average adult has 9-12 pints (4.3-5.7 L) of blood in his or her body. °Therapeutic phlebotomy may be used to treat the following medical conditions: °Hemochromatosis. This is a condition in which the blood contains too much iron. °Polycythemia vera. This is a condition in which the blood contains too many red blood cells. °Porphyria cutanea tarda. This is a disease in which an important part of hemoglobin is not made properly. It results in the buildup of abnormal amounts of porphyrins in the body. °Sickle cell disease. This is a condition in which the red blood cells form an abnormal crescent shape rather than a round shape. °Tell a health care provider about: °Any allergies you have. °All medicines you are taking, including vitamins, herbs, eye drops, creams, and over-the-counter medicines. °Any bleeding problems you have. °Any surgeries you have had. °Any medical conditions you have. °Whether you are pregnant or may be pregnant. °What are the risks? °Generally, this is a safe procedure. However, problems may occur, including: °Nausea or light-headedness. °Low blood pressure (hypotension). °Soreness, bleeding, swelling, or bruising at the needle insertion site. °Infection. °What happens before the procedure? °Ask your health care provider about: °Changing or stopping your regular medicines. This is especially important if you are taking diabetes medicines or blood thinners. °Taking medicines such as aspirin and ibuprofen. These medicines can thin your blood. Do not take these medicines unless your health care provider tells you to take them. °Taking over-the-counter medicines, vitamins, herbs, and supplements. °Wear clothing with sleeves that can be raised  above the elbow. °You may have a blood sample taken. °Your blood pressure, pulse rate, and breathing rate will be measured. °What happens during the procedure? ° °You may be given a medicine to numb the area (local anesthetic). °A tourniquet will be placed on your arm. °A needle will be put into one of your veins. °Tubing and a collection bag will be attached to the needle. °Blood will flow through the needle and tubing into the collection bag. °The collection bag will be placed lower than your arm so gravity can help the blood flow into the bag. °You may be asked to open and close your hand slowly and continually during the entire collection. °After the specified amount of blood has been removed from your body, the collection bag and tubing will be clamped. °The needle will be removed from your vein. °Pressure will be held on the needle site to stop the bleeding. °A bandage (dressing) will be placed over the needle insertion site. °The procedure may vary among health care providers and hospitals. °What happens after the procedure? °Your blood pressure, pulse rate, and breathing rate will be measured after the procedure. °You will be encouraged to drink fluids. °You will be encouraged to eat a snack to prevent a low blood sugar level. °Your recovery will be assessed and monitored. °Return to your normal activities as told by your health care provider. °Summary °Therapeutic phlebotomy is the planned removal of blood from a person's body for the purpose of treating a medical condition. °Therapeutic phlebotomy may be used to treat hemochromatosis, polycythemia vera, porphyria cutanea tarda, or sickle cell disease. °In the procedure, a needle is inserted and about a pint (470 mL, or 0.47 L) of blood is   removed. The average adult has 9-12 pints (4.3-5.7 L) of blood in the body. °This is generally a safe procedure, but it can sometimes cause problems such as nausea, light-headedness, or low blood pressure  (hypotension). °This information is not intended to replace advice given to you by your health care provider. Make sure you discuss any questions you have with your health care provider. °Document Revised: 12/31/2020 Document Reviewed: 12/31/2020 °Elsevier Patient Education © 2022 [employer] ° °

## 2021-06-15 ENCOUNTER — Encounter: Payer: Self-pay | Admitting: Hematology

## 2021-07-17 ENCOUNTER — Other Ambulatory Visit: Payer: Self-pay

## 2021-07-17 ENCOUNTER — Ambulatory Visit
Admission: RE | Admit: 2021-07-17 | Discharge: 2021-07-17 | Disposition: A | Discharge status: Home / Self Care | Payer: Medicare Other | Source: Ambulatory Visit | Attending: Sports Medicine | Admitting: Sports Medicine

## 2021-07-17 DIAGNOSIS — M5030 Other cervical disc degeneration, unspecified cervical region: Secondary | ICD-10-CM

## 2021-07-28 ENCOUNTER — Ambulatory Visit (INDEPENDENT_AMBULATORY_CARE_PROVIDER_SITE_OTHER): Payer: Medicare Other | Admitting: Sports Medicine

## 2021-07-28 VITALS — BP 122/86 | Ht 66.0 in | Wt 130.0 lb

## 2021-07-28 DIAGNOSIS — M5030 Other cervical disc degeneration, unspecified cervical region: Secondary | ICD-10-CM

## 2021-07-28 NOTE — Progress Notes (Signed)
F/U RT hand weakness  Patient seen for intermittent RT hand and distal arm weakness WE felt this was from neck  Cervical spine films confirmed a lot of lower Cervical spine DDD Weakness pattern was consistent with C5/6 radiculopathy  Posture exercises and scap stretches have lessened her weakness Much better most days She is active in yoga and doing well I stopped her head stands and they may have helped  Secondary problem is some RT shin pain  PE Pleasant older F in NAD BP 122/86    Ht 5\' 6"  (1.676 m)    Wt 130 lb (59 kg)    LMP 07/19/2008    BMI 20.98 kg/m   Neck ROM is good today and sitting posture is better No pain on motion C5 to T1 strength and sensory testing normal RT scap with less winging  XR reviewed with patient and suspect key areas are loss of disc space C5/6 and C6/7

## 2021-07-28 NOTE — Patient Instructions (Signed)
Foot strike exercise for osteopenia 30 seconds x 3 rest in between  Standing dumbell presses with light weight 3 lbs x 10 repeats  Posture we want: Cheek bone over breast bone Ears over midline of shoulder  Isometric neck in 4 directions 5 x 5 breaths  Trigger finger - massage and voltaren gel to keep moving

## 2021-07-28 NOTE — Assessment & Plan Note (Addendum)
This is improving with posture and exercise program  Continue to work on these  Avoid pressure on head (no head stands) can try with shoulder support  Can see me as needed and will follow with Dr Coletta Memos  Addition RT shin Scaphoid pad added to give more support and less lower leg pain

## 2021-08-17 ENCOUNTER — Other Ambulatory Visit: Payer: Self-pay | Admitting: Internal Medicine

## 2021-08-17 NOTE — Telephone Encounter (Signed)
Please refill as per office routine med refill policy (all routine meds to be refilled for 3 mo or monthly (per pt preference) up to one year from last visit, then month to month grace period for 3 mo, then further med refills will have to be denied) ? ?

## 2021-08-17 NOTE — Telephone Encounter (Signed)
Sorry - as you said, ok to deny since not seen since 2020, and needs ROV

## 2021-09-04 ENCOUNTER — Inpatient Hospital Stay: Payer: Medicare Other

## 2021-09-11 ENCOUNTER — Inpatient Hospital Stay: Payer: Medicare Other | Attending: Nurse Practitioner

## 2021-09-11 ENCOUNTER — Other Ambulatory Visit: Payer: Self-pay

## 2021-09-11 LAB — CBC WITH DIFFERENTIAL (CANCER CENTER ONLY)
Abs Immature Granulocytes: 0.01 10*3/uL (ref 0.00–0.07)
Basophils Absolute: 0 10*3/uL (ref 0.0–0.1)
Basophils Relative: 1 %
Eosinophils Absolute: 0 10*3/uL (ref 0.0–0.5)
Eosinophils Relative: 1 %
HCT: 43.8 % (ref 36.0–46.0)
Hemoglobin: 14.8 g/dL (ref 12.0–15.0)
Immature Granulocytes: 0 %
Lymphocytes Relative: 30 %
Lymphs Abs: 1.6 10*3/uL (ref 0.7–4.0)
MCH: 31.2 pg (ref 26.0–34.0)
MCHC: 33.8 g/dL (ref 30.0–36.0)
MCV: 92.2 fL (ref 80.0–100.0)
Monocytes Absolute: 0.5 10*3/uL (ref 0.1–1.0)
Monocytes Relative: 11 %
Neutro Abs: 3 10*3/uL (ref 1.7–7.7)
Neutrophils Relative %: 57 %
Platelet Count: 247 10*3/uL (ref 150–400)
RBC: 4.75 MIL/uL (ref 3.87–5.11)
RDW: 11.8 % (ref 11.5–15.5)
WBC Count: 5.2 10*3/uL (ref 4.0–10.5)
nRBC: 0 % (ref 0.0–0.2)

## 2021-09-11 LAB — IRON AND IRON BINDING CAPACITY (CC-WL,HP ONLY)
Iron: 146 ug/dL (ref 28–170)
Saturation Ratios: 45 % — ABNORMAL HIGH (ref 10.4–31.8)
TIBC: 323 ug/dL (ref 250–450)
UIBC: 177 ug/dL (ref 148–442)

## 2021-09-11 LAB — FERRITIN: Ferritin: 20 ng/mL (ref 11–307)

## 2021-11-25 ENCOUNTER — Ambulatory Visit: Payer: Medicare Other | Admitting: Podiatry

## 2021-11-25 ENCOUNTER — Ambulatory Visit (INDEPENDENT_AMBULATORY_CARE_PROVIDER_SITE_OTHER): Payer: Medicare Other

## 2021-11-25 DIAGNOSIS — S93401A Sprain of unspecified ligament of right ankle, initial encounter: Secondary | ICD-10-CM | POA: Diagnosis not present

## 2021-11-25 DIAGNOSIS — S93499A Sprain of other ligament of unspecified ankle, initial encounter: Secondary | ICD-10-CM | POA: Diagnosis not present

## 2021-11-25 DIAGNOSIS — M79672 Pain in left foot: Secondary | ICD-10-CM

## 2021-11-25 DIAGNOSIS — S93491S Sprain of other ligament of right ankle, sequela: Secondary | ICD-10-CM

## 2021-11-25 NOTE — Progress Notes (Signed)
? ?HPI: 66 y.o. female presenting today as a new patient for evaluation of pain and tenderness associated to the right ankle joint.  Patient states that she has a longstanding history of ankle instability and ankle sprains.  She says that about every 8-10 years she sustains an ankle sprain.  About 3 weeks ago she missed a step and sprained her right ankle.  No swelling.  She only has pain with certain movements during yoga.  She wears an old ankle brace.  She presents for further treatment and evaluation ? ?Past Medical History:  ?Diagnosis Date  ? Anxiety   ? severe  ? Anxiety disorder   ? Basal cell carcinoma 01/12/2011  ? BCC LEFT SIDE OF NOSE TX=MOHS  ? Fibrocystic breast disease   ? GERD (gastroesophageal reflux disease)   ? triggered with anxiety-not treated with meds  ? Gilbert's syndrome   ? Heart murmur   ? slight murmur dx by MD- no treatment-EKG clear  ? Hemochromatosis   ? High blood pressure   ? on meds  ? Hyperlipidemia   ? diet controlled  ? Skin cancer, basal cell   ? nose  ? ? ?Past Surgical History:  ?Procedure Laterality Date  ? BREAST BIOPSY Bilateral   ? COLONOSCOPY    ? colonscopy  2011  ? Tics ; Dr Sharlett Iles  ? DILATION AND CURETTAGE OF UTERUS    ? INGUINAL HERNIA REPAIR    ? MOHS SURGERY  2012  ? nose  ? TONSILLECTOMY    ? Tracer Pellets  2003  ? in breast; [place] [facility]  ? WISDOM TOOTH EXTRACTION    ? ? ?Allergies  ?Allergen Reactions  ? Latex Itching  ?  Redness & itching ?Because of a history of documented adverse serious drug reaction;Medi Alert bracelet  is recommended  ? Epinephrine   ?  ? jittery  ? Effexor [Venlafaxine]   ?  Shaking, elevated BP,full blown panic attack ? ?Hypertension with systolic blood pressure 737 with severe nausea  ? Amoxicillin   ?  REACTION: GI UPSET  ? Citalopram   ?  ? nausea  ? ?  ?Physical Exam: ?General: The patient is alert and oriented x3 in no acute distress. ? ?Dermatology: Skin is warm, dry and supple bilateral lower extremities. Negative for open lesions or  macerations. ? ?Vascular: Palpable pedal pulses bilaterally. Capillary refill within normal limits.  Negative for any significant edema or erythema ? ?Neurological: Light touch and protective threshold grossly intact ? ?Musculoskeletal Exam: No pedal deformities noted.  Negative pain on palpation or with range of motion to the right ankle joint.   Range of motion within normal limits.  No crepitus.  Muscle strength 5/5 all compartments. ? ?Radiographic Exam:  ?Normal osseous mineralization. Joint spaces preserved.  Specifically there is no degenerative changes or joint space narrowing of the ankle joint evidence of possibly an old fracture to the fibular malleolus.  Rounded subfibular ossicle noted ? ?Assessment: ?1.  Mild ankle sprain right ? ? ?Plan of Care:  ?1. Patient evaluated. X-Rays reviewed.  ?2.  New ankle brace was dispensed today.  Wear daily x4 weeks ?3.  Patient enjoys yoga.  Explained to the patient that she may need to modify exercises.  If there is a specific exercise or pose that elicits pain in the ankle she should not do it ?4.  Continue wearing good supportive shoes and sneakers.  Patient walks about 2 miles per day without pain.  She may continue walking ?  5.  Return to clinic as needed ? ?  ?  ?Edrick Kins, DPM ?[place] ? ?Dr. Edrick Kins, DPM  ?  ?[address]                                        ?[place], [place] [address]                ?Office [phone]  ?Fax [phone] ? ? ? ? ?

## 2021-12-01 ENCOUNTER — Other Ambulatory Visit: Payer: Self-pay

## 2021-12-02 ENCOUNTER — Other Ambulatory Visit: Payer: Self-pay

## 2021-12-02 ENCOUNTER — Inpatient Hospital Stay: Payer: Medicare Other | Attending: Nurse Practitioner

## 2021-12-02 LAB — CBC WITH DIFFERENTIAL (CANCER CENTER ONLY)
Abs Immature Granulocytes: 0 10*3/uL (ref 0.00–0.07)
Basophils Absolute: 0 10*3/uL (ref 0.0–0.1)
Basophils Relative: 1 %
Eosinophils Absolute: 0.1 10*3/uL (ref 0.0–0.5)
Eosinophils Relative: 1 %
HCT: 41.1 % (ref 36.0–46.0)
Hemoglobin: 14.4 g/dL (ref 12.0–15.0)
Immature Granulocytes: 0 %
Lymphocytes Relative: 37 %
Lymphs Abs: 1.6 10*3/uL (ref 0.7–4.0)
MCH: 32.3 pg (ref 26.0–34.0)
MCHC: 35 g/dL (ref 30.0–36.0)
MCV: 92.2 fL (ref 80.0–100.0)
Monocytes Absolute: 0.4 10*3/uL (ref 0.1–1.0)
Monocytes Relative: 10 %
Neutro Abs: 2.3 10*3/uL (ref 1.7–7.7)
Neutrophils Relative %: 51 %
Platelet Count: 247 10*3/uL (ref 150–400)
RBC: 4.46 MIL/uL (ref 3.87–5.11)
RDW: 12.1 % (ref 11.5–15.5)
WBC Count: 4.4 10*3/uL (ref 4.0–10.5)
nRBC: 0 % (ref 0.0–0.2)

## 2021-12-02 LAB — IRON AND IRON BINDING CAPACITY (CC-WL,HP ONLY)
Iron: 170 ug/dL (ref 28–170)
Saturation Ratios: 55 % — ABNORMAL HIGH (ref 10.4–31.8)
TIBC: 312 ug/dL (ref 250–450)
UIBC: 142 ug/dL — ABNORMAL LOW (ref 148–442)

## 2021-12-02 LAB — FERRITIN: Ferritin: 31 ng/mL (ref 11–307)

## 2021-12-08 ENCOUNTER — Other Ambulatory Visit: Payer: Self-pay

## 2021-12-09 ENCOUNTER — Encounter: Payer: Self-pay | Admitting: Hematology

## 2021-12-16 ENCOUNTER — Encounter: Payer: Self-pay | Admitting: Obstetrics and Gynecology

## 2021-12-21 ENCOUNTER — Inpatient Hospital Stay: Payer: Medicare Other | Attending: Nurse Practitioner

## 2021-12-21 ENCOUNTER — Other Ambulatory Visit: Payer: Self-pay

## 2021-12-21 NOTE — Patient Instructions (Signed)
Therapeutic Phlebotomy °Therapeutic phlebotomy is the planned removal of blood from a Anita Schmidt's body for the purpose of treating a medical condition. The procedure is lot like donating blood. Usually, about a pint (470 mL, or 0.47 L) of blood is removed. The average adult has 9-12 pints (4.3-5.7 L) of blood in his or her body. °Therapeutic phlebotomy may be used to treat the following medical conditions: °Hemochromatosis. This is a condition in which the blood contains too much iron. °Polycythemia vera. This is a condition in which the blood contains too many red blood cells. °Porphyria cutanea tarda. This is a disease in which an important part of hemoglobin is not made properly. It results in the buildup of abnormal amounts of porphyrins in the body. °Sickle cell disease. This is a condition in which the red blood cells form an abnormal crescent shape rather than a round shape. °Tell a health care provider about: °Any allergies you have. °All medicines you are taking, including vitamins, herbs, eye drops, creams, and over-the-counter medicines. °Any bleeding problems you have. °Any surgeries you have had. °Any medical conditions you have. °Whether you are pregnant or may be pregnant. °What are the risks? °Generally, this is a safe procedure. However, problems may occur, including: °Nausea or light-headedness. °Low blood pressure (hypotension). °Soreness, bleeding, swelling, or bruising at the needle insertion site. °Infection. °What happens before the procedure? °Ask your health care provider about: °Changing or stopping your regular medicines. This is especially important if you are taking diabetes medicines or blood thinners. °Taking medicines such as aspirin and ibuprofen. These medicines can thin your blood. Do not take these medicines unless your health care provider tells you to take them. °Taking over-the-counter medicines, vitamins, herbs, and supplements. °Wear clothing with sleeves that can be raised  above the elbow. °You may have a blood sample taken. °Your blood pressure, pulse rate, and breathing rate will be measured. °What happens during the procedure? ° °You may be given a medicine to numb the area (local anesthetic). °A tourniquet will be placed on your arm. °A needle will be put into one of your veins. °Tubing and a collection bag will be attached to the needle. °Blood will flow through the needle and tubing into the collection bag. °The collection bag will be placed lower than your arm so gravity can help the blood flow into the bag. °You may be asked to open and close your hand slowly and continually during the entire collection. °After the specified amount of blood has been removed from your body, the collection bag and tubing will be clamped. °The needle will be removed from your vein. °Pressure will be held on the needle site to stop the bleeding. °A bandage (dressing) will be placed over the needle insertion site. °The procedure may vary among health care providers and hospitals. °What happens after the procedure? °Your blood pressure, pulse rate, and breathing rate will be measured after the procedure. °You will be encouraged to drink fluids. °You will be encouraged to eat a snack to prevent a low blood sugar level. °Your recovery will be assessed and monitored. °Return to your normal activities as told by your health care provider. °Summary °Therapeutic phlebotomy is the planned removal of blood from a Anita Schmidt's body for the purpose of treating a medical condition. °Therapeutic phlebotomy may be used to treat hemochromatosis, polycythemia vera, porphyria cutanea tarda, or sickle cell disease. °In the procedure, a needle is inserted and about a pint (470 mL, or 0.47 L) of blood is   removed. The average adult has 9-12 pints (4.3-5.7 L) of blood in the body. °This is generally a safe procedure, but it can sometimes cause problems such as nausea, light-headedness, or low blood pressure  (hypotension). °This information is not intended to replace advice given to you by your health care provider. Make sure you discuss any questions you have with your health care provider. °Document Revised: 12/31/2020 Document Reviewed: 12/31/2020 °Elsevier Patient Education © 2022 [employer] Inc. ° °

## 2021-12-21 NOTE — Progress Notes (Signed)
Anita Schmidt presents today for phlebotomy per MD orders. Phlebotomy procedure started at 1515 and ended at 1525. 522 grams removed.  16 G Kit used, tolerated well  Patient observed for 30 minutes after procedure without any incident. Patient tolerated procedure well. IV needle removed intact.

## 2022-03-01 ENCOUNTER — Other Ambulatory Visit: Payer: Self-pay

## 2022-03-02 ENCOUNTER — Ambulatory Visit: Payer: Medicare Other | Admitting: Sports Medicine

## 2022-03-02 VITALS — BP 151/75 | Ht 66.0 in | Wt 130.0 lb

## 2022-03-02 DIAGNOSIS — G2589 Other specified extrapyramidal and movement disorders: Secondary | ICD-10-CM

## 2022-03-02 NOTE — Progress Notes (Signed)
   Established Patient Office Visit  Subjective   Patient ID: Anita Schmidt, female    DOB: [birth year]  Age: 66 y.o. MRN: [id number]  R upper extremity weakness  Anita Schmidt presents today for follow up on right-sided arm weakness, last seen 07/28/2021.  Patient completed some neck range of motion stretches, some scapular stretches and exercises along with her regular yoga.  Recently she has discontinued her stretches as she feels like she is not getting any stronger.  She feels during some yoga poses she has minimal control over her extremity.  She does notice some shaking after prolonged use of her right upper extremity.  She denies any numbness or tingling down her right extremity.  Of note patient is concerned today about Parkinson's as her mother had this disease.  She denies any tremor at rest or difficulty with ambulation.  Patient has appointment scheduled with neurology at recommendation of her primary care doctor, but does not feel that she needs to pursue that so is here today for follow-up.    Objective:     BP (!) 151/75   Ht 5' 6"  (1.676 m)   Wt 130 lb (59 kg)   LMP 07/19/2008   BMI 20.98 kg/m   Physical Exam Vitals reviewed.  Constitutional:      General: She is not in acute distress.    Appearance: Normal appearance. She is normal weight. She is not ill-appearing, toxic-appearing or diaphoretic.  Pulmonary:     Effort: Pulmonary effort is normal.  Neurological:     Mental Status: She is alert.   Right upper extremity: No obvious deformity.  She does have a slightly lower shoulder height in comparison to her left.  No tenderness to palpation along her AC joint, lateral shoulder, wrist or hand.  Full range of motion forward flexion, abduction, internal and external rotation.  Patient has symmetric strength 5/5 in bilateral upper extremities with flexion, extension, abduction and adduction.  Negative empty can test.  Sensation to light touch intact bilaterally.  Distal  pulses 2+ radial.  No pill-rolling tremor noted at rest.  No cogwheel rigidity after repeated elbow flexion and extension. Slightly diminished scapular rotation in comparison to her left.  No scapular winging with forward flexion. Neck: No obvious deformity or asymmetry.  No tenderness to palpation of midline spinous process or trapezius muscles.  Full range of motion with neck flexion, extension, rotation and side bending.    Assessment & Plan:   Problem List Items Addressed This Visit       Other   Scapular dyskinesis - Primary    In comparison to patient's exam in January per office dictation note patient has greatly improved range of motion and strength.  Recommend continuation with yoga, strength and stretching exercises for her cervical spine, rotator cuff and scapula.   I anticipate some of her rotator cuff weakness and scapular dyskinesis was secondary to her cervical degenerative disc disease. At this time I do not see any clinical signs of Parkinson's disease.  Patient to follow-up with her primary care provider.       Return if symptoms worsen or fail to improve.    Elmore Guise, DO  I observed and examined the patient with the [facility] resident and agree with assessment and plan.  Note reviewed and modified by me. Ila Mcgill, MD

## 2022-03-03 NOTE — Progress Notes (Unsigned)
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 Last seen by Anita Schmidt 03/04/21. Last phlebotomy was 12/21/21, she tolerated relatively well.  She thought she was tired all the time but now knows that was emotional anxiety, she continues to work to manage her anxiety and stress with yoga and wine.  She is fearful to take occasions that processed through the liver.  Overall she feels great, no new specific complaints or signs of thrombosis.  All other systems were reviewed with the patient and are negative.  MEDICAL HISTORY:  Past Medical History:  Diagnosis Date   Anxiety    severe   Anxiety disorder    Basal cell carcinoma 01/12/2011   BCC LEFT SIDE OF NOSE TX=MOHS   Fibrocystic breast disease    GERD (gastroesophageal reflux disease)    triggered with anxiety-not treated with meds   Gilbert's syndrome    Heart murmur    slight murmur dx by MD- no treatment-EKG clear   Hemochromatosis    High blood pressure    on meds   Hyperlipidemia    diet controlled   Skin cancer, basal cell    nose    SURGICAL HISTORY: Past Surgical History:  Procedure Laterality Date   BREAST BIOPSY Bilateral    COLONOSCOPY     colonscopy  2011   Tics ; Dr Sharlett Iles    DILATION AND CURETTAGE OF UTERUS     INGUINAL HERNIA REPAIR     MOHS SURGERY  2012   nose   TONSILLECTOMY     Tracer Pellets  2003   in breast; [place] EXTRACTION      I have reviewed the social history and family history with the patient and they are unchanged from previous note.  ALLERGIES:  is allergic to latex, epinephrine, effexor [venlafaxine], amoxicillin, and citalopram.  MEDICATIONS:  Current Outpatient Medications  Medication Sig Dispense Refill   amLODipine (NORVASC) 2.5 MG tablet Take 1 tablet (2.5 mg total) by mouth daily. Overdue for Annual appt w/last must see provider for future refills 30 tablet 0   Cholecalciferol (VITAMIN D3) 25 MCG (1000 UT) CAPS Take 1 capsule by mouth daily.     Cyanocobalamin (VITAMIN B 12 PO) Take 1,000 Units by mouth daily.     No current facility-administered medications for this visit.    PHYSICAL EXAMINATION: ECOG PERFORMANCE STATUS: 0 - Asymptomatic  Vitals:   03/04/22 0944  BP: (!) 164/86  Pulse: 60  Resp: 17  Temp: 97.9 F (36.6 C)  SpO2: 100%   Filed Weights   03/04/22 0944  Weight: 132 lb 7 oz (60.1 kg)    GENERAL:alert, no distress and comfortable SKIN: no rash  EYES:  sclera clear NECK: without mass LUNGS: clear with normal breathing effort HEART: regular rate & rhythm, no lower extremity edema ABDOMEN:abdomen soft, non-tender and normal bowel sounds NEURO: alert &  oriented x 3 with fluent speech, no focal motor/sensory deficits  LABORATORY DATA:  I have reviewed the data as listed    Latest Ref Rng & Units 03/04/2022    9:28 AM 12/02/2021    9:10 AM 09/11/2021    9:28 AM  CBC  WBC 4.0 - 10.5 K/uL 4.6  4.4  5.2   Hemoglobin 12.0 - 15.0 g/dL 14.7  14.4  14.8   Hematocrit 36.0 - 46.0 % 41.6  41.1  43.8   Platelets 150 - 400 K/uL 229  247  247         Latest Ref Rng & Units 07/05/2019    2:12 PM 04/27/2019   11:29 AM 05/23/2018    9:01 AM  CMP  Glucose 70 - 99 mg/dL 80  108  114   BUN 6 -  23 mg/dL 16  15  16    Creatinine 0.40 - 1.20 mg/dL 1.08  0.76  0.80   Sodium 135 - 145 mEq/L 141  141  140   Potassium 3.5 - 5.1 mEq/L 3.9  4.1  4.0   Chloride 96 - 112 mEq/L 104  106  104   CO2 19 - 32 mEq/L 31  28  29    Calcium 8.4 - 10.5 mg/dL 9.5  9.4  9.5   Total Protein 6.0 - 8.3 g/dL 6.7  6.9  6.8   Total Bilirubin 0.2 - 1.2 mg/dL 0.6  0.6  0.8   Alkaline Phos 39 - 117 U/L 55  59  57   AST 0 - 37 U/L 15  17  17    ALT 0 - 35 U/L 11  13  12        RADIOGRAPHIC STUDIES: I have personally reviewed the radiological images as listed and agreed with the findings in the report. No results found.   ASSESSMENT & PLAN: Anita Schmidt is a 67 y.o. female with      1. 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 She has been followed by her GI only until 01/2019. Her ferritin has been in the range of 150-290, iron saturation 60-72% and higher before beginning phlebotomies -Her MRI from 04/30/19 shows iron deposition in the liver and spleen, compatible with hemochromatosis. Her 06/2018 ECHO was normal, repeat q3 years.  -She is currently being treated with therapeutic Phlebotomies as needed with target ferritin<50 and transferrin saturation less than 50%. She tolerates well as long as she can control anxiety -Anita Schmidt appears stable. She required 2 phlebotomies in the past year. She feels well overall -will repeat abd [place] and echo this month, I will call her with results -Continue same regimen which includes lab q3 months and phlebotomy if ferritin >50 or transferrin sat >50%.  -I encouraged her to continue healthy active lifestyle and age-appropriate health maintenance/cancer screenings.  I reviewed the health risks of increased alcohol and encouraged her to cut back -F/up in 12 months, or sooner if needed   2. Anxiety -she is seen by mental health provider at [facility] on her own.  -She is hesitant to take medications that are processed in the  liver  -She uses alcohol to cope with anxiety. I cautioned her on this and encouraged her to consider other ways to cope   3. Mildly low Vitamin B12  -Seen on labs with 06/2019 -continue oral B12     Plan: -CBC reviewed, we will follow-up the pending ferritin and iron studies from today -screening Echo and  ABD [place] in 1-2 weeks, I will call with results -Lab every 3 months x4, phlebotomy if ferritin > 50 or transferrin saturation > 50% -Follow-up in 12 months, or sooner if needed  Orders Placed This Encounter  Procedures   US Abdomen Complete    Standing Status:   Future    Standing Expiration Date:   03/04/2023    Order Specific Question:   Reason for Exam (SYMPTOM  OR DIAGNOSIS REQUIRED)    Answer:   Hereditary hemochromatosis, rule out cirrhosis    Order Specific Question:   Preferred imaging location?    Answer:   [facility]   ECHOCARDIOGRAM COMPLETE    Standing Status:   Future    Standing Expiration Date:   03/05/2023    Order Specific Question:   Where should this test be performed    Answer:   [place]    Order Specific Question:   Perflutren DEFINITY (image enhancing agent) should be administered unless hypersensitivity or allergy exist    Answer:   Administer Perflutren    Order Specific Question:   Reason for exam-Echo    Answer:   Other-Full Diagnosis List    Order Specific Question:   Full ICD-10/Reason for Exam    Answer:   Hereditary hemochromatosis ([place]) [275.01.ICD-9-CM]    All questions were answered. The patient knows to call the clinic with any problems, questions or concerns. No barriers to learning were detected.      Alla Feeling, NP 03/04/22

## 2022-03-03 NOTE — Assessment & Plan Note (Signed)
In comparison to patient's exam in January per office dictation note patient has greatly improved range of motion and strength.  Recommend continuation with yoga, strength and stretching exercises for her cervical spine, rotator cuff and scapula.   I anticipate some of her rotator cuff weakness and scapular dyskinesis was secondary to her cervical degenerative disc disease. At this time I do not see any clinical signs of Parkinson's disease.  Patient to follow-up with her primary care provider.

## 2022-03-04 ENCOUNTER — Inpatient Hospital Stay (HOSPITAL_BASED_OUTPATIENT_CLINIC_OR_DEPARTMENT_OTHER): Payer: Medicare Other | Admitting: Nurse Practitioner

## 2022-03-04 ENCOUNTER — Encounter: Payer: Self-pay | Admitting: Nurse Practitioner

## 2022-03-04 ENCOUNTER — Other Ambulatory Visit: Payer: Self-pay

## 2022-03-04 ENCOUNTER — Inpatient Hospital Stay: Payer: Medicare Other | Attending: Nurse Practitioner

## 2022-03-04 DIAGNOSIS — Z85828 Personal history of other malignant neoplasm of skin: Secondary | ICD-10-CM | POA: Diagnosis not present

## 2022-03-04 DIAGNOSIS — F419 Anxiety disorder, unspecified: Secondary | ICD-10-CM | POA: Diagnosis not present

## 2022-03-04 LAB — IRON AND IRON BINDING CAPACITY (CC-WL,HP ONLY)
Iron: 141 ug/dL (ref 28–170)
Saturation Ratios: 46 % — ABNORMAL HIGH (ref 10.4–31.8)
TIBC: 308 ug/dL (ref 250–450)
UIBC: 167 ug/dL (ref 148–442)

## 2022-03-04 LAB — CBC WITH DIFFERENTIAL/PLATELET
Abs Immature Granulocytes: 0 10*3/uL (ref 0.00–0.07)
Basophils Absolute: 0.1 10*3/uL (ref 0.0–0.1)
Basophils Relative: 1 %
Eosinophils Absolute: 0.1 10*3/uL (ref 0.0–0.5)
Eosinophils Relative: 1 %
HCT: 41.6 % (ref 36.0–46.0)
Hemoglobin: 14.7 g/dL (ref 12.0–15.0)
Immature Granulocytes: 0 %
Lymphocytes Relative: 41 %
Lymphs Abs: 1.9 10*3/uL (ref 0.7–4.0)
MCH: 32.2 pg (ref 26.0–34.0)
MCHC: 35.3 g/dL (ref 30.0–36.0)
MCV: 91 fL (ref 80.0–100.0)
Monocytes Absolute: 0.5 10*3/uL (ref 0.1–1.0)
Monocytes Relative: 10 %
Neutro Abs: 2.1 10*3/uL (ref 1.7–7.7)
Neutrophils Relative %: 47 %
Platelets: 229 10*3/uL (ref 150–400)
RBC: 4.57 MIL/uL (ref 3.87–5.11)
RDW: 11.7 % (ref 11.5–15.5)
WBC: 4.6 10*3/uL (ref 4.0–10.5)
nRBC: 0 % (ref 0.0–0.2)

## 2022-03-04 LAB — FERRITIN: Ferritin: 13 ng/mL (ref 11–307)

## 2022-03-10 ENCOUNTER — Ambulatory Visit (HOSPITAL_COMMUNITY)
Admission: RE | Admit: 2022-03-10 | Discharge: 2022-03-10 | Disposition: A | Discharge status: Home / Self Care | Payer: Medicare Other | Source: Ambulatory Visit | Attending: Nurse Practitioner | Admitting: Nurse Practitioner

## 2022-03-10 DIAGNOSIS — I081 Rheumatic disorders of both mitral and tricuspid valves: Secondary | ICD-10-CM | POA: Insufficient documentation

## 2022-03-10 DIAGNOSIS — E785 Hyperlipidemia, unspecified: Secondary | ICD-10-CM | POA: Diagnosis not present

## 2022-03-10 LAB — ECHOCARDIOGRAM COMPLETE
Area-P 1/2: 3.15 cm2
S' Lateral: 1.9 cm

## 2022-03-11 ENCOUNTER — Other Ambulatory Visit (HOSPITAL_COMMUNITY): Payer: Medicare Other

## 2022-03-15 ENCOUNTER — Ambulatory Visit (HOSPITAL_COMMUNITY)
Admission: RE | Admit: 2022-03-15 | Discharge: 2022-03-15 | Disposition: A | Discharge status: Home / Self Care | Payer: Medicare Other | Source: Ambulatory Visit | Attending: Nurse Practitioner | Admitting: Nurse Practitioner

## 2022-03-19 ENCOUNTER — Ambulatory Visit: Payer: Medicare Other | Admitting: Neurology

## 2022-06-03 ENCOUNTER — Inpatient Hospital Stay: Payer: Medicare Other | Attending: Nurse Practitioner

## 2022-06-03 LAB — CBC WITH DIFFERENTIAL (CANCER CENTER ONLY)
Abs Immature Granulocytes: 0 10*3/uL (ref 0.00–0.07)
Basophils Absolute: 0 10*3/uL (ref 0.0–0.1)
Basophils Relative: 1 %
Eosinophils Absolute: 0 10*3/uL (ref 0.0–0.5)
Eosinophils Relative: 1 %
HCT: 42.4 % (ref 36.0–46.0)
Hemoglobin: 14.6 g/dL (ref 12.0–15.0)
Immature Granulocytes: 0 %
Lymphocytes Relative: 39 %
Lymphs Abs: 1.5 10*3/uL (ref 0.7–4.0)
MCH: 31.9 pg (ref 26.0–34.0)
MCHC: 34.4 g/dL (ref 30.0–36.0)
MCV: 92.8 fL (ref 80.0–100.0)
Monocytes Absolute: 0.4 10*3/uL (ref 0.1–1.0)
Monocytes Relative: 11 %
Neutro Abs: 1.9 10*3/uL (ref 1.7–7.7)
Neutrophils Relative %: 48 %
Platelet Count: 294 10*3/uL (ref 150–400)
RBC: 4.57 MIL/uL (ref 3.87–5.11)
RDW: 12.5 % (ref 11.5–15.5)
WBC Count: 3.9 10*3/uL — ABNORMAL LOW (ref 4.0–10.5)
nRBC: 0 % (ref 0.0–0.2)

## 2022-06-03 LAB — FERRITIN: Ferritin: 22 ng/mL (ref 11–307)

## 2022-06-03 LAB — IRON AND IRON BINDING CAPACITY (CC-WL,HP ONLY)
Iron: 160 ug/dL (ref 28–170)
Saturation Ratios: 50 % — ABNORMAL HIGH (ref 10.4–31.8)
TIBC: 323 ug/dL (ref 250–450)
UIBC: 163 ug/dL (ref 148–442)

## 2022-06-22 ENCOUNTER — Other Ambulatory Visit: Payer: Self-pay | Admitting: Family Medicine

## 2022-06-22 DIAGNOSIS — Z1231 Encounter for screening mammogram for malignant neoplasm of breast: Secondary | ICD-10-CM

## 2022-07-05 ENCOUNTER — Encounter: Payer: Self-pay | Admitting: Nurse Practitioner

## 2022-08-09 ENCOUNTER — Encounter: Payer: Self-pay | Admitting: Hematology

## 2022-08-16 ENCOUNTER — Ambulatory Visit: Payer: Medicare Other

## 2022-08-19 ENCOUNTER — Ambulatory Visit: Payer: Medicare Other

## 2022-08-31 ENCOUNTER — Telehealth: Payer: Self-pay | Admitting: Hematology

## 2022-08-31 NOTE — Telephone Encounter (Signed)
Contacted patient to scheduled appointments. Left message with appointment details and a call back number if patient had any questions or could not accommodate the time we provided.   

## 2022-08-31 NOTE — Telephone Encounter (Signed)
Patient called to reschedule Lab only appointment due to testing positive for covid on January 27th. Rescheduled patient, patient is aware of date and time.

## 2022-09-02 ENCOUNTER — Inpatient Hospital Stay: Payer: Medicare HMO

## 2022-09-09 ENCOUNTER — Encounter: Payer: Self-pay | Admitting: Nurse Practitioner

## 2022-09-09 ENCOUNTER — Inpatient Hospital Stay: Payer: Medicare HMO

## 2022-09-09 ENCOUNTER — Inpatient Hospital Stay: Payer: Medicare HMO | Attending: Nurse Practitioner

## 2022-09-09 LAB — CBC WITH DIFFERENTIAL (CANCER CENTER ONLY)
Abs Immature Granulocytes: 0.02 10*3/uL (ref 0.00–0.07)
Basophils Absolute: 0.1 10*3/uL (ref 0.0–0.1)
Basophils Relative: 1 %
Eosinophils Absolute: 0 10*3/uL (ref 0.0–0.5)
Eosinophils Relative: 0 %
HCT: 41.8 % (ref 36.0–46.0)
Hemoglobin: 14.5 g/dL (ref 12.0–15.0)
Immature Granulocytes: 0 %
Lymphocytes Relative: 28 %
Lymphs Abs: 1.9 10*3/uL (ref 0.7–4.0)
MCH: 32.6 pg (ref 26.0–34.0)
MCHC: 34.7 g/dL (ref 30.0–36.0)
MCV: 93.9 fL (ref 80.0–100.0)
Monocytes Absolute: 0.6 10*3/uL (ref 0.1–1.0)
Monocytes Relative: 9 %
Neutro Abs: 4.2 10*3/uL (ref 1.7–7.7)
Neutrophils Relative %: 62 %
Platelet Count: 346 10*3/uL (ref 150–400)
RBC: 4.45 MIL/uL (ref 3.87–5.11)
RDW: 11.8 % (ref 11.5–15.5)
WBC Count: 6.9 10*3/uL (ref 4.0–10.5)
nRBC: 0 % (ref 0.0–0.2)

## 2022-09-09 LAB — IRON AND IRON BINDING CAPACITY (CC-WL,HP ONLY)
Iron: 167 ug/dL (ref 28–170)
Saturation Ratios: 54 % — ABNORMAL HIGH (ref 10.4–31.8)
TIBC: 309 ug/dL (ref 250–450)
UIBC: 142 ug/dL — ABNORMAL LOW (ref 148–442)

## 2022-09-09 LAB — FERRITIN: Ferritin: 34 ng/mL (ref 11–307)

## 2022-09-13 ENCOUNTER — Telehealth: Payer: Self-pay

## 2022-09-13 ENCOUNTER — Other Ambulatory Visit: Payer: Self-pay

## 2022-09-13 ENCOUNTER — Ambulatory Visit
Admission: RE | Admit: 2022-09-13 | Discharge: 2022-09-13 | Disposition: A | Discharge status: Home / Self Care | Payer: Medicare HMO | Source: Ambulatory Visit | Attending: Family Medicine | Admitting: Family Medicine

## 2022-09-13 ENCOUNTER — Ambulatory Visit: Payer: Medicare Other

## 2022-09-13 DIAGNOSIS — Z1231 Encounter for screening mammogram for malignant neoplasm of breast: Secondary | ICD-10-CM

## 2022-09-13 NOTE — Telephone Encounter (Addendum)
Called patient made her aware of Results below as per [name]. Patient agreed to doing phlebotomy so I sent a message to scheduling to set it up.   ----- Message from Truitt Merle, MD sent at 09/10/2022  7:29 AM EST ----- Please let pt know I recommend phlebotomy since her iron saturation is above 50%, please arrange if she agrees. She is recovering from [place], so this can be done anytime in next month. Thanks  Truitt Merle

## 2022-09-15 ENCOUNTER — Telehealth: Payer: Self-pay | Admitting: Nurse Practitioner

## 2022-09-15 NOTE — Telephone Encounter (Signed)
Contacted patient to scheduled appointments. Patient is aware of appointments that are scheduled.   

## 2022-09-17 ENCOUNTER — Ambulatory Visit: Payer: Medicare Other

## 2022-09-24 ENCOUNTER — Inpatient Hospital Stay: Payer: Medicare HMO | Attending: Nurse Practitioner

## 2022-09-24 DIAGNOSIS — Z79899 Other long term (current) drug therapy: Secondary | ICD-10-CM | POA: Insufficient documentation

## 2022-09-24 NOTE — Progress Notes (Signed)
Anita Schmidt presents today for phlebotomy per MD orders ferritin above 50 or sat >50% Phlebotomy procedure started at 1550 and ended at 1557 using phleb kit. 535 grams removed. Patient observed for 60 minutes after procedure without any incident as she had consumed alcohol prior to start of procedure and was driving home. Pt offered food and drink but stated she brought her own Patient tolerated procedure well. IV needle removed intact.  Pt discharged to lobby in stable condition

## 2022-09-24 NOTE — Patient Instructions (Signed)

## 2022-11-30 ENCOUNTER — Telehealth: Payer: Self-pay | Admitting: Hematology

## 2022-12-02 ENCOUNTER — Inpatient Hospital Stay: Payer: Medicare HMO

## 2022-12-09 ENCOUNTER — Inpatient Hospital Stay: Payer: Medicare HMO | Attending: Nurse Practitioner

## 2022-12-09 LAB — CBC WITH DIFFERENTIAL (CANCER CENTER ONLY)
Abs Immature Granulocytes: 0 10*3/uL (ref 0.00–0.07)
Basophils Absolute: 0.1 10*3/uL (ref 0.0–0.1)
Basophils Relative: 1 %
Eosinophils Absolute: 0.1 10*3/uL (ref 0.0–0.5)
Eosinophils Relative: 1 %
HCT: 44.2 % (ref 36.0–46.0)
Hemoglobin: 15.2 g/dL — ABNORMAL HIGH (ref 12.0–15.0)
Immature Granulocytes: 0 %
Lymphocytes Relative: 38 %
Lymphs Abs: 2 10*3/uL (ref 0.7–4.0)
MCH: 32.1 pg (ref 26.0–34.0)
MCHC: 34.4 g/dL (ref 30.0–36.0)
MCV: 93.2 fL (ref 80.0–100.0)
Monocytes Absolute: 0.5 10*3/uL (ref 0.1–1.0)
Monocytes Relative: 9 %
Neutro Abs: 2.6 10*3/uL (ref 1.7–7.7)
Neutrophils Relative %: 51 %
Platelet Count: 296 10*3/uL (ref 150–400)
RBC: 4.74 MIL/uL (ref 3.87–5.11)
RDW: 11.9 % (ref 11.5–15.5)
WBC Count: 5.1 10*3/uL (ref 4.0–10.5)
nRBC: 0 % (ref 0.0–0.2)

## 2022-12-09 LAB — FERRITIN: Ferritin: 13 ng/mL (ref 11–307)

## 2022-12-29 ENCOUNTER — Ambulatory Visit: Payer: Medicare HMO | Admitting: Sports Medicine

## 2022-12-29 VITALS — BP 132/86 | Ht 66.0 in | Wt 132.0 lb

## 2022-12-29 DIAGNOSIS — S76012A Strain of muscle, fascia and tendon of left hip, initial encounter: Secondary | ICD-10-CM

## 2022-12-29 NOTE — Progress Notes (Signed)
PCP: Tracey Harries, MD  Subjective:   HPI: Patient is a 67 y.o. female here for left hip pain.  Patient has had some tightness and soreness around her anterior left hip which started about 2 weeks ago when she was doing yoga.  She was stretching and felt like she might of overdone it.  No clear trauma, pulling or popping during that event.  She has occasional nagging soreness and pain that typically occurs when she is activating her hip flexors.  She specifically notes that when she gets in her gets out of her car, her pain is slightly worse, but then gets better spontaneously.  Pain does not keep her from performing her ADLs or from sleeping.  She is not taking any OTC meds or done PT for this pain yet.  Past Medical History:  Diagnosis Date   Anxiety    severe   Anxiety disorder    Basal cell carcinoma 01/12/2011   BCC LEFT SIDE OF NOSE TX=MOHS   Fibrocystic breast disease    GERD (gastroesophageal reflux disease)    triggered with anxiety-not treated with meds   Gilbert's syndrome    Heart murmur    slight murmur dx by MD- no treatment-EKG clear   Hemochromatosis    High blood pressure    on meds   Hyperlipidemia    diet controlled   Skin cancer, basal cell    nose    Current Outpatient Medications on File Prior to Visit  Medication Sig Dispense Refill   amLODipine (NORVASC) 2.5 MG tablet Take 1 tablet (2.5 mg total) by mouth daily. Overdue for Annual appt w/last must see provider for future refills 30 tablet 0   Cholecalciferol (VITAMIN D3) 25 MCG (1000 UT) CAPS Take 1 capsule by mouth daily.     Cyanocobalamin (VITAMIN B 12 PO) Take 1,000 Units by mouth daily.     No current facility-administered medications on file prior to visit.    Past Surgical History:  Procedure Laterality Date   BREAST BIOPSY Bilateral    COLONOSCOPY     colonscopy  2011   Tics ; Dr Jarold Motto   DILATION AND CURETTAGE OF UTERUS     INGUINAL HERNIA REPAIR     MOHS SURGERY  2012   nose    TONSILLECTOMY     Tracer Pellets  2003   in breast; [facility]   WISDOM TOOTH EXTRACTION      Allergies  Allergen Reactions   Latex Itching    Redness & itching Because of a history of documented adverse serious drug reaction;Medi Alert bracelet  is recommended   Epinephrine     ? jittery   Effexor [Venlafaxine]     Shaking, elevated BP,full blown panic attack  Hypertension with systolic blood pressure 210 with severe nausea   Amoxicillin     REACTION: GI UPSET   Citalopram     ? nausea         01/07/2021   10:11 AM  [facility]  Frequency of aerobic exercise (# of days/week) 5  Average time in minutes 40  Frequency of strengthening activities (# of days/week) 2        Objective:  BP 132/86   Ht 5\' 6"  (1.676 m)   Wt 132 lb (59.9 kg)   LMP 07/19/2008   BMI 21.31 kg/m   Physical Exam:  Gen: NAD, comfortable in exam room  Left hip: No clear deformities, ecchymosis, warmth or edema around the joint.  Full AROM and PROM left hip flexion, abduction, adduction, internal and external rotation.  5/5 strength hip abduction and abduction.  4/5 strength hip flexion, limited by pain.  Reproducible tenderness at anterior hip with hip flexion against resistance.  No pain with compression or palpation around the hip.  No pain to palpation of greater trochanter.  Negative logroll, FABER and FADIR.  Negative Trendelenburg sign.   Assessment & Plan:  1.  Left hip pain: Likely musculoskeletal strain of left hip flexors.  Less likely to be tear given she still has good strength, range of motion.  Discussed treatment options including NSAIDs and x-ray to screen for avulsion fracture and she declined both.  Will continue conservative management at this time. - Ice as needed - Hip flexor PT - Follow-up in 2 weeks and consider x-ray at that time if symptoms have not improved  FELLOW ATTESTATION: I personally evaluated the patient with the resident and agree with the  above documentation with the following emphasis: Pleasant 67 year old female with 2 weeks of left hip pain after overstretching and yoga.  No bruising or swelling.  Only mild tender to palpation at the ASIS/AIIS.  Likely hip flexor strain of the sartorius or rectus femoris.  She conservatively with rest, ice, PT.  She does not want take anti-inflammatories.  Follow-up in 2 weeks if no improvement, will get x-ray to rule out avulsion fracture.  All questions answered she agrees to plan.  Baldemar Friday Rafoth 12/29/2022

## 2023-01-19 ENCOUNTER — Ambulatory Visit: Payer: Medicare HMO | Admitting: Sports Medicine

## 2023-01-19 ENCOUNTER — Telehealth: Payer: Self-pay

## 2023-01-19 NOTE — Telephone Encounter (Signed)
Pt calling to inquire about recall letter she had received in the mail about her B&P exam. States she thought that Dr. Edward Jolly had told her that due to her age and the fact that she is no longer sexually active, that she no longer required an exam with [place].  Per last AEX notes from 12/2020- "F/u annually and prn."  Pt advised that I will inquire from Dr. Edward Jolly when she returns to the office and will get back with her response/advise. She voiced understanding and sends her best.

## 2023-01-28 NOTE — Telephone Encounter (Signed)
Pt returned call saying she received msg and can return call. Will send msg to appt desk to contact pt to schedule B&P.

## 2023-01-28 NOTE — Telephone Encounter (Signed)
I recommend a breast and pelvic exam every 2 years.  She may discontinue pap smears.  I recommend yearly mammograms.

## 2023-01-28 NOTE — Telephone Encounter (Signed)
LDVM on machine per DPR. Advised her to cb to let us know that she received msg and to let us know if she would like for appt desk to give her a call to schedule her next B&P exam.

## 2023-02-09 NOTE — Telephone Encounter (Signed)
Pt scheduled for B&P exam on 05/23/2023. Will route to provider for final review and close.

## 2023-03-03 ENCOUNTER — Encounter: Payer: Self-pay | Admitting: Hematology

## 2023-03-03 ENCOUNTER — Other Ambulatory Visit: Payer: Self-pay

## 2023-03-03 ENCOUNTER — Inpatient Hospital Stay: Payer: Medicare HMO

## 2023-03-03 ENCOUNTER — Inpatient Hospital Stay: Payer: Medicare HMO | Attending: Nurse Practitioner | Admitting: Hematology

## 2023-03-03 DIAGNOSIS — F411 Generalized anxiety disorder: Secondary | ICD-10-CM | POA: Diagnosis not present

## 2023-03-03 DIAGNOSIS — R5383 Other fatigue: Secondary | ICD-10-CM | POA: Insufficient documentation

## 2023-03-03 LAB — CBC WITH DIFFERENTIAL (CANCER CENTER ONLY)
Abs Immature Granulocytes: 0.01 10*3/uL (ref 0.00–0.07)
Basophils Absolute: 0 10*3/uL (ref 0.0–0.1)
Basophils Relative: 1 %
Eosinophils Absolute: 0.1 10*3/uL (ref 0.0–0.5)
Eosinophils Relative: 2 %
HCT: 42.1 % (ref 36.0–46.0)
Hemoglobin: 14.3 g/dL (ref 12.0–15.0)
Immature Granulocytes: 0 %
Lymphocytes Relative: 36 %
Lymphs Abs: 1.7 10*3/uL (ref 0.7–4.0)
MCH: 31.6 pg (ref 26.0–34.0)
MCHC: 34 g/dL (ref 30.0–36.0)
MCV: 93.1 fL (ref 80.0–100.0)
Monocytes Absolute: 0.5 10*3/uL (ref 0.1–1.0)
Monocytes Relative: 10 %
Neutro Abs: 2.4 10*3/uL (ref 1.7–7.7)
Neutrophils Relative %: 51 %
Platelet Count: 251 10*3/uL (ref 150–400)
RBC: 4.52 MIL/uL (ref 3.87–5.11)
RDW: 12 % (ref 11.5–15.5)
WBC Count: 4.7 10*3/uL (ref 4.0–10.5)
nRBC: 0 % (ref 0.0–0.2)

## 2023-03-03 LAB — FERRITIN: Ferritin: 20 ng/mL (ref 11–307)

## 2023-03-03 NOTE — Assessment & Plan Note (Signed)
-  Diagnosed in 2010. She has been followed by her GI only until 01/2019. Her ferritin has been in the range of 150-290, iron saturation 60-72% and higher before beginning phlebotomies -Her MRI from 04/30/19 shows iron deposition in the liver and spleen, compatible with hemochromatosis. Her 06/2018 ECHO was normal, repeat q3 years.  -She is currently being treated with therapeutic Phlebotomies as needed with target ferritin<50 and transferrin saturation less than 50%. She tolerates well as long as she can control anxiety --Continue same regimen which includes lab q3 months and phlebotomy if ferritin >50 or transferrin sat >50%.

## 2023-03-03 NOTE — Assessment & Plan Note (Signed)
-  she is seen by mental health provider at [facility] on her own.  -She is hesitant to take medications that are processed in the liver  -She uses alcohol to cope with anxiety. I cautioned her on this and encouraged her to consider other ways to cope

## 2023-03-03 NOTE — Progress Notes (Signed)
[facility]   Telephone:(336) [phone] Fax:(336) [phone]   Clinic Follow up Note   Patient Care Team: Tracey Harries, MD as PCP - General (Family Medicine) Malachy Mood, MD as Consulting Physician (Hematology) Ardell Isaacs, Forrestine Him, MD as Consulting Physician (Obstetrics and Gynecology) Osborn Coho, MD (Inactive) as Consulting Physician (Otolaryngology) Mckinley Jewel, MD as Consulting Physician (Ophthalmology) Glyn Ade, PA-C as Physician Assistant (Dermatology) Armbruster, Willaim Rayas, MD as Consulting Physician (Gastroenterology)  Date of Service:  03/03/2023  CHIEF COMPLAINT: f/u of 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 female with   Hemochromatosis -Diagnosed in 2010. She has been followed by her GI only until 01/2019. Her ferritin has been in the range of 150-290, iron saturation 60-72% and higher before beginning phlebotomies -Her MRI from 04/30/19 shows iron deposition in the liver and spleen, compatible with hemochromatosis. Her 06/2018 ECHO was normal, repeat q3 years.  -She is currently being treated with therapeutic Phlebotomies as needed with target ferritin<50 and transferrin saturation less than 50%. She tolerates well as long as she can control anxiety --Continue same regimen which includes lab q3 months and phlebotomy if ferritin >50 or transferrin sat >50%.   Anxiety state -she is seen by mental health provider at [facility] on her own.  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 She was last seen by NP Lacie on 03/04/2022. She presents to the  clinic alone. Pt state that she has some fatigue. Pt state that she does see a therapist to manage her anxiety.      All other systems were reviewed with the patient and are negative.  MEDICAL HISTORY:  Past Medical History:  Diagnosis Date   Anxiety    severe   Anxiety disorder    Basal cell carcinoma 01/12/2011   BCC LEFT SIDE OF NOSE TX=MOHS   Fibrocystic breast disease    GERD (gastroesophageal reflux disease)    triggered with anxiety-not treated with meds   Gilbert's syndrome    Heart murmur    slight murmur dx by MD- no treatment-EKG clear   Hemochromatosis    High blood pressure    on meds   Hyperlipidemia    diet controlled   Skin cancer, basal cell    nose    SURGICAL HISTORY: Past Surgical History:  Procedure Laterality Date   BREAST BIOPSY Bilateral    COLONOSCOPY     colonscopy  2011   Tics ; Dr Jarold Motto   DILATION AND CURETTAGE OF UTERUS     INGUINAL HERNIA REPAIR     MOHS SURGERY  2012   nose   TONSILLECTOMY     Tracer Pellets  2003   in breast; [facility]   WISDOM TOOTH EXTRACTION      I have reviewed the social history and family history with the patient and they are unchanged from previous note.  ALLERGIES:  is allergic to latex, epinephrine, effexor [venlafaxine], amoxicillin, and citalopram.  MEDICATIONS:  Current Outpatient Medications  Medication Sig Dispense Refill   amLODipine (NORVASC) 2.5 MG tablet Take 1 tablet (2.5  mg total) by mouth daily. Overdue for Annual appt w/last must see provider for future refills 30 tablet 0   Cholecalciferol (VITAMIN D3) 25 MCG (1000 UT) CAPS Take 1 capsule by mouth daily.     Cyanocobalamin (VITAMIN B 12 PO) Take 1,000 Units by mouth daily.     No current facility-administered medications for this visit.    PHYSICAL EXAMINATION: ECOG PERFORMANCE STATUS: 0 - Asymptomatic  Vitals:   03/03/23 0934  BP: 133/78  Pulse: 65  Resp: 16  Temp: (!) 97.4 F (36.3 C)  SpO2: 100%   Wt Readings from Last 3  Encounters:  03/03/23 135 lb 8 oz (61.5 kg)  12/29/22 132 lb (59.9 kg)  03/04/22 132 lb 7 oz (60.1 kg)     GENERAL:alert, no distress and comfortable SKIN: skin color normal, no rashes or significant lesions EYES: normal, Conjunctiva are pink and non-injected, sclera clear  NEURO: alert & oriented x 3 with fluent speech  LABORATORY DATA:  I have reviewed the data as listed    Latest Ref Rng & Units 03/03/2023    9:02 AM 12/09/2022    9:59 AM 09/09/2022   11:02 AM  CBC  WBC 4.0 - 10.5 K/uL 4.7  5.1  6.9   Hemoglobin 12.0 - 15.0 g/dL 11.9  14.7  82.9   Hematocrit 36.0 - 46.0 % 42.1  44.2  41.8   Platelets 150 - 400 K/uL 251  296  346         Latest Ref Rng & Units 07/05/2019    2:12 PM 04/27/2019   11:29 AM 05/23/2018    9:01 AM  CMP  Glucose 70 - 99 mg/dL 80  562  130   BUN 6 - 23 mg/dL 16  15  16    Creatinine 0.40 - 1.20 mg/dL 8.65  7.84  6.96   Sodium 135 - 145 mEq/L 141  141  140   Potassium 3.5 - 5.1 mEq/L 3.9  4.1  4.0   Chloride 96 - 112 mEq/L 104  106  104   CO2 19 - 32 mEq/L 31  28  29    Calcium 8.4 - 10.5 mg/dL 9.5  9.4  9.5   Total Protein 6.0 - 8.3 g/dL 6.7  6.9  6.8   Total Bilirubin 0.2 - 1.2 mg/dL 0.6  0.6  0.8   Alkaline Phos 39 - 117 U/L 55  59  57   AST 0 - 37 U/L 15  17  17    ALT 0 - 35 U/L 11  13  12        RADIOGRAPHIC STUDIES: I have personally reviewed the radiological images as listed and agreed with the findings in the report. No results found.    Orders Placed This Encounter  Procedures   Iron and Iron Binding Capacity ([facility] only)    Standing Status:   Standing    Number of Occurrences:   2    Standing Expiration Date:   03/02/2024   All questions were answered. The patient knows to call the clinic with any problems, questions or concerns. No barriers to learning was detected. The total time spent in the appointment was 15 minutes.     Malachy Mood, MD 03/03/2023   Carolin Coy, CMA, am acting as scribe for Malachy Mood, MD.    I have reviewed the above documentation for accuracy and completeness, and I agree with the above.

## 2023-03-07 ENCOUNTER — Encounter: Payer: Self-pay | Admitting: Hematology

## 2023-03-08 ENCOUNTER — Other Ambulatory Visit: Payer: Self-pay | Admitting: *Deleted

## 2023-03-08 LAB — IRON AND IRON BINDING CAPACITY (CC-WL,HP ONLY)
Iron: 120 ug/dL (ref 28–170)
Saturation Ratios: 37 % — ABNORMAL HIGH (ref 10.4–31.8)
TIBC: 326 ug/dL (ref 250–450)
UIBC: 206 ug/dL

## 2023-03-22 ENCOUNTER — Encounter: Payer: Self-pay | Admitting: Hematology

## 2023-05-09 NOTE — Progress Notes (Signed)
67 y.o. [id number] Single Caucasian female here for annual exam.    No GYN concerns.   No vaginal bleeding or discharge.  No concern about bladder or bowel function or control.   Not sexually active.   She is dealing with anxiety.  She has declined medication in the past.  She sees a therapist.   Will see neurology for tremor.  Her mother had Parkinson's. She is not certain if it is related to her anxiety.  PCP: Tracey Harries, MD   Patient's last menstrual period was 07/19/2008.           Sexually active: No.  The current method of family planning is post menopausal status.    Exercising: Yes.     Yoga, hand weights, elliptical  Smoker:  no  OB History  Gravida Para Term Preterm AB Living  2       2 0  SAB IAB Ectopic Multiple Live Births               # Outcome Date GA Lbr Len/2nd Weight Sex Type Anes PTL Lv  2 AB           1 AB              Health Maintenance: Pap:  01/30/19 Neg:Neg HR HPV,  12/18/15 Neg:Neg HR HPV, 01/11/12 Neg  History of abnormal Pap:  no MMG: 09/13/22 Breast Density Cat B, BI-RADS CAT 1 neg Colonoscopy:   HM Colonoscopy          Colonoscopy (Every 7 Years) Next due on 11/29/2027    11/28/2020  COLONOSCOPY   Only the first 1 history entries have been loaded, but more history exists.           BMD:  02/26/20 Result  osteoporosis.  Forearm: T -3.2, spine: T -1.7, hip:  -0.7 HIV: 11/08/17 neg Hep C: 11/08/17 neg  Immunization History  Administered Date(s) Administered   Moderna Sars-Covid-2 Vaccination 08/20/2019, 09/18/2019, 05/29/2020   Pfizer Covid-19 Vaccine Bivalent Booster 54yrs & up 07/08/2021   Td 07/19/2002   Tdap 07/07/2012     reports that she has never smoked. She has never used smokeless tobacco. She reports that she does not currently use alcohol. She reports that she does not use drugs.  Past Medical History:  Diagnosis Date   Anxiety    severe   Anxiety disorder    Basal cell carcinoma 01/12/2011   BCC LEFT SIDE OF  NOSE TX=MOHS   Fibrocystic breast disease    GERD (gastroesophageal reflux disease)    triggered with anxiety-not treated with meds   Gilbert's syndrome    Heart murmur    slight murmur dx by MD- no treatment-EKG clear   Hemochromatosis    High blood pressure    on meds   Hyperlipidemia    diet controlled   Skin cancer, basal cell    nose    Past Surgical History:  Procedure Laterality Date   BREAST BIOPSY Bilateral    COLONOSCOPY     colonscopy  2011   Tics ; Dr Jarold Motto   DILATION AND CURETTAGE OF UTERUS     INGUINAL HERNIA REPAIR     MOHS SURGERY  2012   nose   TONSILLECTOMY     Tracer Pellets  2003   in breast; [facility]   WISDOM TOOTH EXTRACTION      Current Outpatient Medications  Medication Sig Dispense Refill   amLODipine (NORVASC) 2.5 MG tablet Take 1 tablet (  2.5 mg total) by mouth daily. Overdue for Annual appt w/last must see provider for future refills 30 tablet 0   Cholecalciferol (VITAMIN D3) 25 MCG (1000 UT) CAPS Take 1 capsule by mouth daily.     Cyanocobalamin (VITAMIN B 12 PO) Take 1,000 Units by mouth daily.     denosumab (PROLIA) 60 MG/ML SOSY injection      ALPRAZolam (XANAX) 0.5 MG tablet TAKE 1/2 TABLET(0.25 MG) BY MOUTH THREE TIMES DAILY AS NEEDED FOR ANXIETY. MAX DAILY AMOUNT: 0.75 MG (Patient not taking: Reported on 05/23/2023)     No current facility-administered medications for this visit.    Family History  Problem Relation Age of Onset   Hypertension Mother        PMH   Alcohol abuse Mother    Parkinson's disease Mother    Anxiety disorder Brother    Depression Brother    Alzheimer's disease Maternal Grandmother    Hypertension Maternal Grandmother    Stroke Paternal Uncle    Alzheimer's disease Paternal Uncle    Diabetes Neg Hx    Colon cancer Neg Hx    Esophageal cancer Neg Hx    Colon polyps Neg Hx    Rectal cancer Neg Hx    Stomach cancer Neg Hx     Review of Systems  All other systems reviewed and are negative.   Exam:    BP 122/74 (BP Location: Left Arm, Patient Position: Sitting, Cuff Size: Normal)   Pulse 69   Ht 5' 5.5" (1.664 m)   Wt 131 lb (59.4 kg)   LMP 07/19/2008   SpO2 99%   BMI 21.47 kg/m     General appearance: alert, cooperative and appears stated age Head: normocephalic, without obvious abnormality, atraumatic Neck: no adenopathy, supple, symmetrical, trachea midline and thyroid normal to inspection and palpation Lungs: clear to auscultation bilaterally Breasts: normal appearance, no masses or tenderness, No nipple retraction or dimpling, No nipple discharge or bleeding, No axillary adenopathy Heart: regular rate and rhythm.  Systolic murmur. Abdomen: soft, non-tender; no masses, no organomegaly Extremities: extremities normal, atraumatic, no cyanosis or edema Skin: skin color, texture, turgor normal. No rashes or lesions Lymph nodes: cervical, supraclavicular, and axillary nodes normal. Neurologic: grossly normal  Pelvic: External genitalia:  no lesions              No abnormal inguinal nodes palpated.              Urethra:  normal appearing urethra with no masses, tenderness or lesions              Bartholins and Skenes: normal                 Vagina: normal appearing vagina with normal color and discharge, no lesions              Cervix: no lesions              Pap taken: no Bimanual Exam:  Uterus:  normal size, contour, position, consistency, mobility, non-tender              Adnexa: no mass, fullness, tenderness              Rectal exam: yes.  Confirms.              Anus:  normal sphincter tone, no lesions  Chaperone was present for exam:  Warren Lacy, CMA   Assessment:  Encounter for breast and pelvic exam.  Cardiac murmur.  Osteoporosis.  On Prolia.  Hemochromatosis. Anxiety.   Plan: Mammogram screening discussed. Self breast awareness reviewed. Guidelines for Calcium, Vitamin D, regular exercise program including cardiovascular and weight bearing exercise. Pap and HR  HPV not indicated.  We discussed that medication to treat anxiety can be helpful to improve quality of life and that it does not have to be a forever medication if it does not agree with her.  Next BMD in 2025.  She will follow up with her PCP regarding her murmur. FU here in 2 years and prn.  10 min  total time was spent for this patient encounter, including preparation, face-to-face counseling with the patient, coordination of care, and documentation of the encounter in addition to doing the breast and pelvic exam.

## 2023-05-11 IMAGING — CR DG CERVICAL SPINE 2 OR 3 VIEWS
3 series · 3 of 3 positions shown · non-contrast
Comparison: None.

CLINICAL DATA: Neck pain and right upper extremity paresthesia.

EXAM:
CERVICAL SPINE - 2-3 VIEW

[w cervical spine lat]
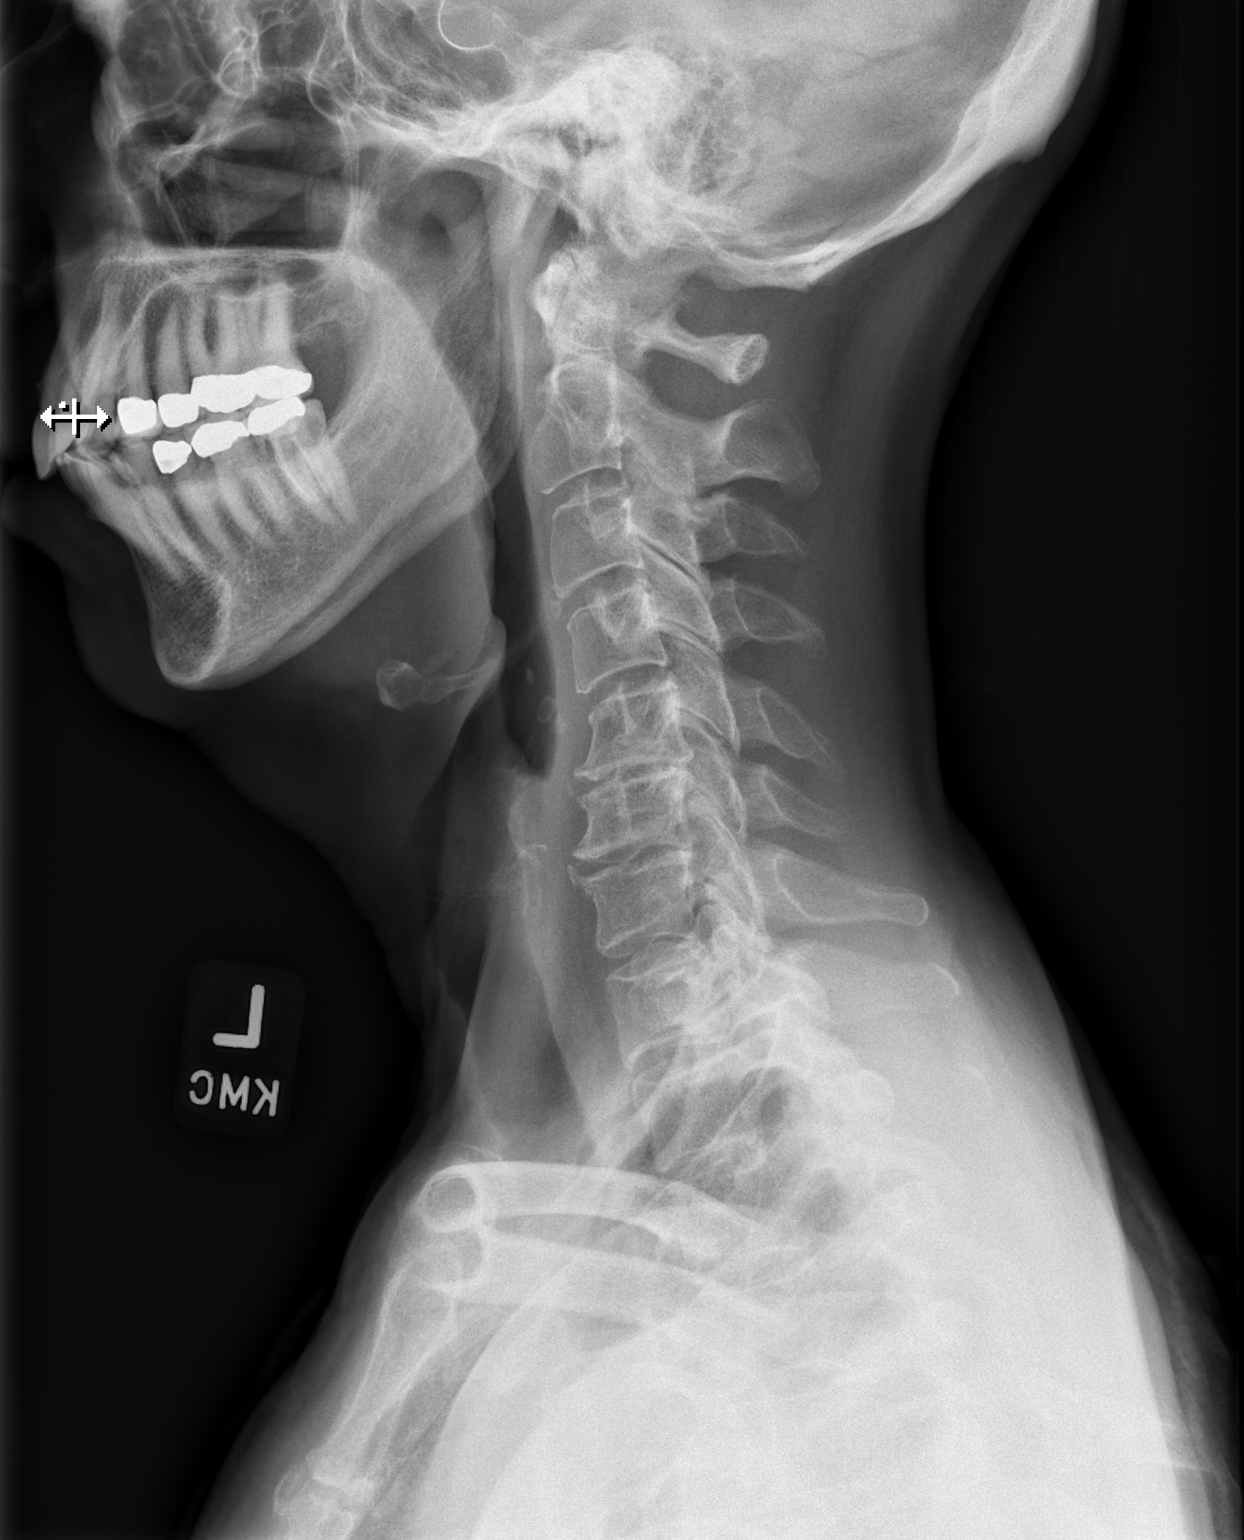

[w cervical spine ap]
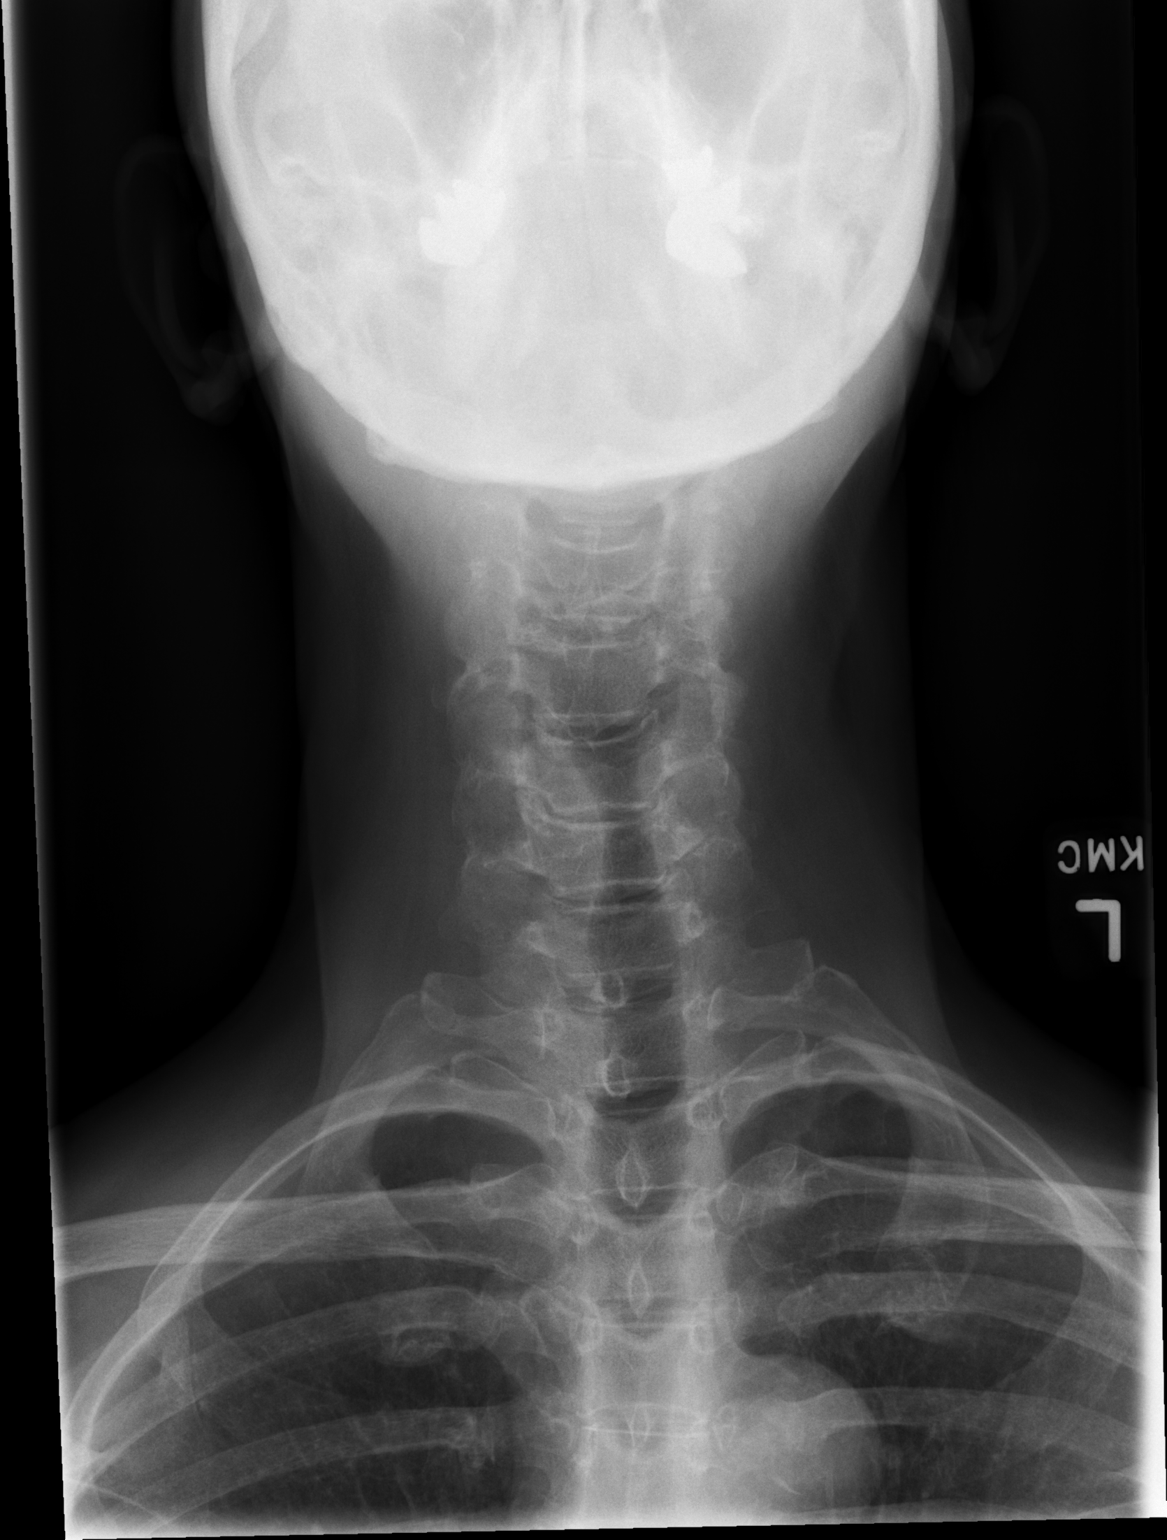

[w cervical spine odontoid]
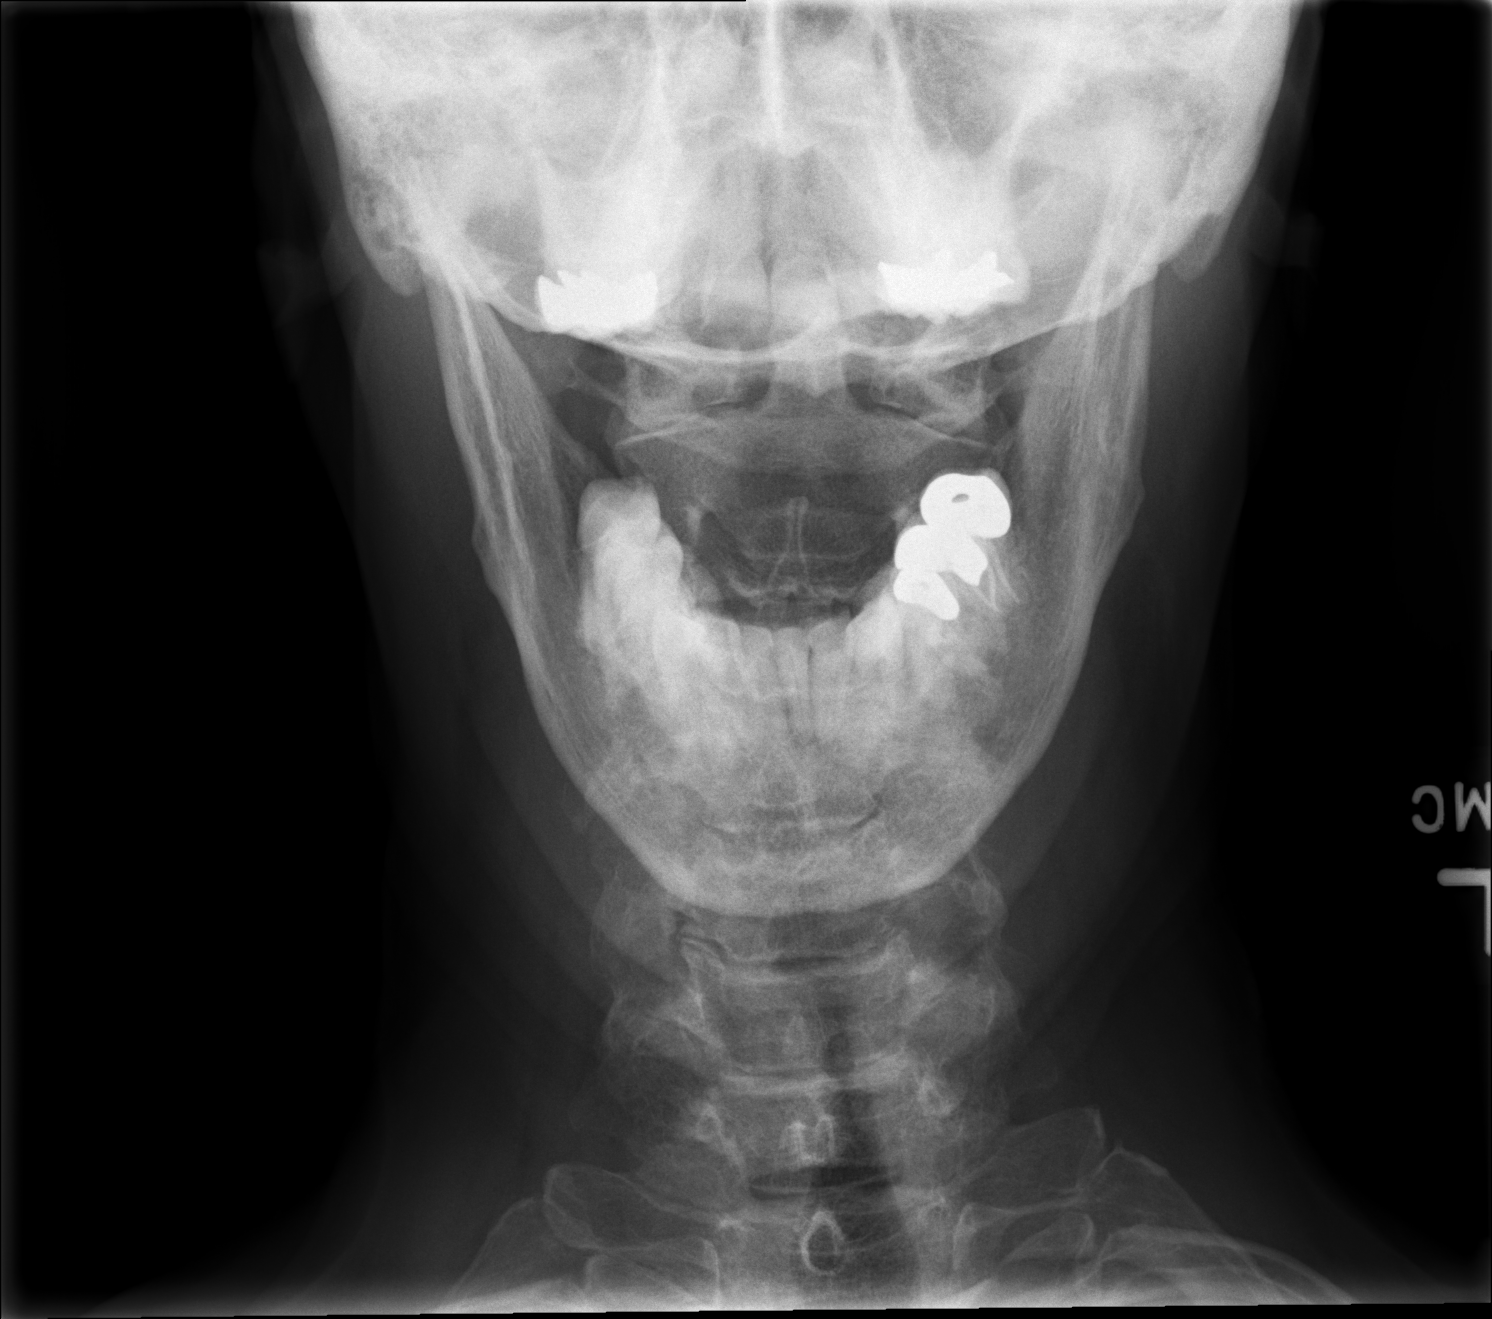

[3 of 3 positions shown; findings below may reference images not displayed]

FINDINGS: Mild osteopenia. Straightened and slightly reversed cervical
lordosis noted present, without listhesis. There is also a slight
dextroscoliosis. Evidence of fractures is not seen.

Degenerative disc collapse and bidirectional osteophytes are present
at C5-6 and C6-7. Other discs are maintained in height without other
significant findings of spondylosis. There is uncinate joint and
facet hypertrophy from C3-4 through C6-7.

No precervical soft tissue thickening is seen. Lung apices are clear
with COPD change.
IMPRESSION: 1. Osteopenia and degenerative change without evidence of fractures.
2. Slight cervical kyphodextroscoliosis.

## 2023-05-23 ENCOUNTER — Encounter: Payer: Self-pay | Admitting: Obstetrics and Gynecology

## 2023-05-23 ENCOUNTER — Ambulatory Visit (INDEPENDENT_AMBULATORY_CARE_PROVIDER_SITE_OTHER): Payer: Medicare HMO | Admitting: Obstetrics and Gynecology

## 2023-05-23 VITALS — BP 122/74 | HR 69 | Ht 65.5 in | Wt 131.0 lb

## 2023-05-23 DIAGNOSIS — Z01419 Encounter for gynecological examination (general) (routine) without abnormal findings: Secondary | ICD-10-CM | POA: Diagnosis not present

## 2023-05-23 DIAGNOSIS — M810 Age-related osteoporosis without current pathological fracture: Secondary | ICD-10-CM

## 2023-05-23 DIAGNOSIS — F411 Generalized anxiety disorder: Secondary | ICD-10-CM | POA: Diagnosis not present

## 2023-05-23 NOTE — Patient Instructions (Signed)

## 2023-05-31 ENCOUNTER — Encounter: Payer: Self-pay | Admitting: Neurology

## 2023-05-31 ENCOUNTER — Telehealth: Payer: Self-pay | Admitting: Neurology

## 2023-05-31 ENCOUNTER — Ambulatory Visit: Payer: Medicare HMO | Admitting: Neurology

## 2023-05-31 VITALS — BP 140/79 | HR 68 | Ht 66.0 in | Wt 131.5 lb

## 2023-05-31 DIAGNOSIS — R292 Abnormal reflex: Secondary | ICD-10-CM

## 2023-05-31 DIAGNOSIS — G20C Parkinsonism, unspecified: Secondary | ICD-10-CM

## 2023-05-31 DIAGNOSIS — G20A1 Parkinson's disease without dyskinesia, without mention of fluctuations: Secondary | ICD-10-CM | POA: Insufficient documentation

## 2023-05-31 DIAGNOSIS — F419 Anxiety disorder, unspecified: Secondary | ICD-10-CM | POA: Diagnosis not present

## 2023-05-31 MED ORDER — RASAGILINE MESYLATE 0.5 MG PO TABS
0.5000 mg | ORAL_TABLET | Freq: Every day | ORAL | 11 refills | Status: DC
Start: 2023-05-31 — End: 2023-06-30

## 2023-05-31 NOTE — Telephone Encounter (Signed)
Pt forgot to ask during appointment if she should continue physical therapy. Please advise, thank you!

## 2023-05-31 NOTE — Progress Notes (Signed)
Chief Complaint  Patient presents with   New Patient (Initial Visit)    Rm14, alone, referral for muscle weakness: right arm , cogwheel rigidity, concern for PD Tracey Harries MD [facility]   ASSESSMENT AND PLAN  Anita Schmidt is a 67 y.o. 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   MEDICAL HISTORY:  Anita Schmidt, is a 66 year old female, seen in request by primary care physician Dr. Tracey Harries for evaluation of parkinsonism, initial evaluation was on May 31 2023  History is obtained from the patient and review of electronic medical records. I personally reviewed pertinent available imaging films in PACS.   PMHx of  HTN Osteoporosis Anxiety  She lives alone, used to be very physically active, last couple years, she noticed that her gait was getting sloppy, does not swing her right arm, clumsiness of the right hand,  She also complains loss sense of smell, sleeps well, denies orthostatic hypotension, constipation,  She has a family history of Parkinson's disease, worried about the diagnosis, positive from her evaluation  PHYSICAL EXAM:   Vitals:   05/31/23 0814  BP: (!) 140/79  Pulse: 68  Weight: 131 lb 8 oz (59.6 kg)  Height: 5\' 6"  (1.676 m)   Body mass index is 21.22 kg/m.  PHYSICAL EXAMNIATION:  Gen: NAD, conversant, well nourised, well groomed                     Cardiovascular: Regular  rate rhythm, no peripheral edema, warm, nontender. Eyes: Conjunctivae clear without exudates or hemorrhage Neck: Supple, no carotid bruits. Pulmonary: Clear to auscultation bilaterally   NEUROLOGICAL EXAM:  MENTAL STATUS: Speech/cognition: Tearful, Awake, alert, oriented to history taking and casual conversation CRANIAL NERVES: CN II: Visual fields are full to confrontation. Pupils are round equal and briskly reactive to light. CN III, IV, VI: extraocular movement are normal. No ptosis. CN V: Facial sensation is intact to light touch CN VII: Face is symmetric with normal eye closure  CN VIII: Hearing is normal to causal conversation. CN IX, X: Phonation is normal. CN XI: Head turning and shoulder shrug are intact  MOTOR: Bulk, strength, no significant resting tremor, mild right more than left rigidity, bradykinesia  REFLEXES: Reflexes are 2+ and symmetric at the biceps, triceps, knees, and ankles. Plantar responses are extensor bilaterally  SENSORY: Intact to light touch, pinprick and vibratory sensation are intact in fingers and toes.  COORDINATION: There is no trunk or limb dysmetria noted.  GAIT/STANCE:  mildly decreased swing right arm, moderate stride, mild retropulse instability  REVIEW OF SYSTEMS:  Full 14 system review of systems performed and notable only for as above All other review of systems were negative.   ALLERGIES: Allergies  Allergen Reactions   Latex Itching    Redness & itching Because of a history of documented adverse serious drug reaction;Medi Alert bracelet  is recommended   Epinephrine     ? jittery   Effexor [Venlafaxine]     Shaking, elevated BP,full blown panic  attack  Hypertension with systolic blood pressure 210 with severe nausea   Amoxicillin     REACTION: GI UPSET   Citalopram     ? nausea    HOME MEDICATIONS: Current Outpatient Medications  Medication Sig Dispense Refill   amLODipine (NORVASC) 2.5 MG tablet Take 1 tablet (2.5  mg total) by mouth daily. Overdue for Annual appt w/last must see provider for future refills 30 tablet 0   Cholecalciferol (VITAMIN D3) 25 MCG (1000 UT) CAPS Take 1 capsule by mouth daily.     Cyanocobalamin (VITAMIN B 12 PO) Take 1,000 Units by mouth daily.     denosumab (PROLIA) 60 MG/ML SOSY injection      No current facility-administered medications for this visit.    PAST MEDICAL HISTORY: Past Medical History:  Diagnosis Date   Anxiety    severe   Anxiety disorder    Basal cell carcinoma 01/12/2011   BCC LEFT SIDE OF NOSE TX=MOHS   Fibrocystic breast disease    GERD (gastroesophageal reflux disease)    triggered with anxiety-not treated with meds   Gilbert's syndrome    Heart murmur    slight murmur dx by MD- no treatment-EKG clear   Hemochromatosis    High blood pressure    on meds   Hyperlipidemia    diet controlled   Skin cancer, basal cell    nose    PAST SURGICAL HISTORY: Past Surgical History:  Procedure Laterality Date   BREAST BIOPSY Bilateral    COLONOSCOPY     colonscopy  2011   Tics ; Dr Jarold Motto   DILATION AND CURETTAGE OF UTERUS     INGUINAL HERNIA REPAIR     MOHS SURGERY  2012   nose   TONSILLECTOMY     Tracer Pellets  2003   in breast; [facility]   WISDOM TOOTH EXTRACTION      FAMILY HISTORY: Family History  Problem Relation Age of Onset   Hypertension Mother        PMH   Alcohol abuse Mother    Parkinson's disease Mother    Anxiety disorder Brother    Depression Brother    Alzheimer's disease Maternal Grandmother    Hypertension Maternal Grandmother    Stroke Paternal Uncle    Alzheimer's disease Paternal Uncle    Diabetes Neg Hx    Colon cancer Neg Hx    Esophageal cancer Neg Hx    Colon polyps Neg Hx    Rectal cancer Neg Hx    Stomach cancer Neg Hx     SOCIAL HISTORY: Social History   Socioeconomic History   Marital status: Single    Spouse name: Not on file   Number of children: Not on file   Years of education: Not on  file   Highest education level: Not on file  Occupational History   Occupation: TEXTILE DESIGNER    Employer: [employer]  Tobacco Use   Smoking status: Never   Smokeless tobacco: Never  Vaping Use   Vaping status: Never Used  Substance and Sexual Activity   Alcohol use: Not Currently   Drug use: No   Sexual activity: Not Currently    Partners: Male    Birth control/protection: Post-menopausal    Comment: less than 5, IC after 16, no abnormal pap, no STD, no DES exposure  Other Topics Concern   Not on file  Social History Narrative   GETS REGULAR EXERCISE         Diet: No Beef  Do you drink/ eat things with caffeine? Sometimes      Marital status:   Single                            What year were you married ?       Do you live in a house, apartment,assistred living, condo, trailer, etc.)? Townhouse      Is it one or more stories? 2      How many persons live in your home ? 1      Do you have any pets in your home ?(please list)  No      Highest Level of education completed: Masters Degree      Current or past profession:       Do you exercise?     Yes                         Type & how often   Yoga, Walking      ADVANCED DIRECTIVES (Please bring copies)      Do you have a living will? Yes      Do you have a DNR form? Yes                     If not, do you want to discuss one?       Do you have signed POA?HPOA forms?    Yes             If so, please bring to your appointment      FUNCTIONAL STATUS- To be completed by Spouse / child / Staff       Do you have difficulty bathing or dressing yourself ?      Do you have difficulty preparing food or eating ?      Do you have difficulty managing your mediation ?      Do you have difficulty managing your finances ?      Do you have difficulty affording your medication ?      Social Determinants of Health   Financial Resource Strain: Low Risk  (12/17/2022)   Received from [facility], [facility]   Overall  Financial Resource Strain (CARDIA)    Difficulty of Paying Living Expenses: Not hard at all  Food Insecurity: No Food Insecurity (12/17/2022)   Received from [facility], [facility]   Hunger Vital Sign    Worried About Running Out of Food in the Last Year: Never true    Ran Out of Food in the Last Year: Never true  Transportation Needs: No Transportation Needs (12/17/2022)   Received from [facility], [facility]   PRAPARE - Transportation    Lack of Transportation (Medical): No    Lack of Transportation (Non-Medical): No  Physical Activity: Insufficiently Active (12/17/2022)   Received from [facility], [facility]   Exercise Vital Sign    Days of Exercise per Week: 2 days    Minutes of Exercise per Session: 60 min  Stress: No Stress Concern Present (12/17/2022)   Received from [facility], [facility] - Occupational Stress Questionnaire    Feeling of Stress : Not at all  Social Connections: Somewhat Isolated (12/17/2022)   Received from [facility], [facility]   Social Network    How would you rate your social network (family, work, friends)?: Restricted participation with some  degree of social isolation  Intimate Partner Violence: Not At Risk (12/17/2022)   Received from [facility], [facility]   HITS    Over the last 12 months how often did your partner physically hurt you?: Never    Over the last 12 months how often did your partner insult you or talk down to you?: Never    Over the last 12 months how often did your partner threaten you with physical harm?: Never    Over the last 12 months how often did your partner scream or curse at you?: Never      Levert Feinstein, M.D. Ph.D.  [facility] [address], [address] [place], [place] [address] Ph: [phone] Fax: [phone]  CC:  Tracey Harries, MD [address] [place],  [place] [address]  Tracey Harries, MD

## 2023-05-31 NOTE — Telephone Encounter (Signed)
She should continue PT

## 2023-06-02 ENCOUNTER — Encounter: Payer: Self-pay | Admitting: Neurology

## 2023-06-02 ENCOUNTER — Telehealth: Payer: Self-pay | Admitting: Neurology

## 2023-06-02 LAB — SEDIMENTATION RATE: Sed Rate: 2 mm/h (ref 0–40)

## 2023-06-02 LAB — RPR: RPR Ser Ql: NONREACTIVE

## 2023-06-02 LAB — VITAMIN B12: Vitamin B-12: 1693 pg/mL — ABNORMAL HIGH (ref 232–1245)

## 2023-06-02 LAB — TSH: TSH: 3.11 u[IU]/mL (ref 0.450–4.500)

## 2023-06-02 LAB — ANA W/REFLEX IF POSITIVE: Anti Nuclear Antibody (ANA): NEGATIVE

## 2023-06-02 LAB — C-REACTIVE PROTEIN: CRP: <1 mg/L (ref 0–10)

## 2023-06-02 LAB — CK: Total CK: 88 U/L (ref 32–182)

## 2023-06-02 NOTE — Telephone Encounter (Signed)
sent to [facility] they obtain [employer] medicare auth [id number] 

## 2023-06-13 ENCOUNTER — Ambulatory Visit
Admission: RE | Admit: 2023-06-13 | Discharge: 2023-06-13 | Disposition: A | Discharge status: Home / Self Care | Payer: Medicare HMO | Source: Ambulatory Visit | Attending: Neurology | Admitting: Neurology

## 2023-06-13 DIAGNOSIS — G20C Parkinsonism, unspecified: Secondary | ICD-10-CM

## 2023-06-13 DIAGNOSIS — R292 Abnormal reflex: Secondary | ICD-10-CM

## 2023-06-20 ENCOUNTER — Ambulatory Visit: Payer: Medicare HMO | Admitting: Neurology

## 2023-06-30 ENCOUNTER — Other Ambulatory Visit: Payer: Self-pay | Admitting: Neurology

## 2023-07-04 ENCOUNTER — Encounter: Payer: Self-pay | Admitting: Neurology

## 2023-07-04 DIAGNOSIS — G20C Parkinsonism, unspecified: Secondary | ICD-10-CM

## 2023-07-04 NOTE — Telephone Encounter (Signed)
Orders Placed This Encounter  Procedures   Ambulatory referral to Physical Therapy   Ambulatory referral to Occupational Therapy    

## 2023-07-08 ENCOUNTER — Encounter: Payer: Self-pay | Admitting: Neurology

## 2023-07-26 ENCOUNTER — Ambulatory Visit: Payer: Medicare HMO

## 2023-07-26 NOTE — Therapy (Signed)
 OUTPATIENT OCCUPATIONAL THERAPY PARKINSON'S EVALUATION  Patient Name: Anita Schmidt MRN: [id number] DOB:01-May-1956, 68 y.o., female Today's Date: 07/27/2023  PCP: Dr. Pura REFERRING PROVIDER: Dr. Onita  END OF SESSION:  OT End of Session - 07/27/23 1446     Visit Number 1    Number of Visits 25    Date for OT Re-Evaluation 10/19/23    Authorization Type [employer]    Authorization Time Period 12 weeks    Authorization - Visit Number 1    Progress Note Due on Visit 10    OT Start Time 1448    OT Stop Time 1528    OT Time Calculation (min) 40 min    Activity Tolerance Patient tolerated treatment well    Behavior During Therapy WFL for tasks assessed/performed             Past Medical History:  Diagnosis Date   Anxiety    severe   Anxiety disorder    Basal cell carcinoma 01/12/2011   BCC LEFT SIDE OF NOSE TX=MOHS   Fibrocystic breast disease    GERD (gastroesophageal reflux disease)    triggered with anxiety-not treated with meds   Gilbert's syndrome    Heart murmur    slight murmur dx by MD- no treatment-EKG clear   Hemochromatosis    High blood pressure    on meds   Hyperlipidemia    diet controlled   Skin cancer, basal cell    nose   Past Surgical History:  Procedure Laterality Date   BREAST BIOPSY Bilateral    COLONOSCOPY     colonscopy  2011   Tics ; Dr Jakie   DILATION AND CURETTAGE OF UTERUS     INGUINAL HERNIA REPAIR     MOHS SURGERY  2012   nose   TONSILLECTOMY     Tracer Pellets  2003   in breast; [facility]   WISDOM TOOTH EXTRACTION     Patient Active Problem List   Diagnosis Date Noted   Hyperreflexia 05/31/2023   Parkinsonism (HCC) 05/31/2023   Right leg weakness 01/07/2021   Weakness of right hip 01/07/2021   Affective disorder (HCC) 10/18/2019   Abnormal EKG 07/03/2018   Infraspinatus strain 04/20/2018   It band syndrome, left 04/20/2018   Cough 10/27/2017   Wheezing 10/27/2017   Anxiety 10/27/2017   Hyperglycemia  04/07/2017   Insect bite 04/07/2017   Cardiac murmur 12/29/2016   Pes anserine bursitis 10/19/2016   Scapular dyskinesis 10/19/2016   Preventative health care 03/11/2016   Generalized anxiety disorder 05/24/2013   Benign essential hypertension 05/23/2013   Diverticulosis of colon without hemorrhage 04/27/2013   Malignant basal cell neoplasm of skin 04/27/2013   Hemochromatosis    Bee sting allergy 03/26/2011   IBS 02/10/2010   Anxiety state 12/25/2008   DISC DISEASE, CERVICAL 11/19/2008   Allergic rhinitis 09/19/2008   Hyperlipidemia 08/01/2008   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   MENOPAUSAL SYNDROME 08/01/2008   Personal history of disease 08/01/2008    ONSET DATE: 07/04/23  REFERRING DIAG:  Diagnosis  G20.C (ICD-10-CM) - Parkinsonism, unspecified Parkinsonism type (HCC)    THERAPY DIAG:  Other symptoms and signs involving the nervous system - Plan: Ot plan of care cert/re-cert  Other lack of coordination - Plan: Ot plan of care cert/re-cert  Stiffness of right elbow, not elsewhere classified - Plan: Ot plan of care cert/re-cert  Other abnormalities of gait and mobility - Plan: Ot plan of care cert/re-cert  Rationale for Evaluation  and Treatment: Rehabilitation  SUBJECTIVE:   SUBJECTIVE STATEMENT: Pt reports she does not swing her right arm when walking Pt accompanied by: self  PERTINENT HISTORY:  Parkinsonism, recently diagnosed  November 2024, however symptomatic with decreased arm swing for several years. PMHx of HTN, ostoporosis, anxiety    PRECAUTIONS: Fall  WEIGHT BEARING RESTRICTIONS: No  PAIN:  Are you having pain? No  FALLS: Has patient fallen in last 6 months? No  LIVING ENVIRONMENT: Lives with: lives alone Lives in: House/apartment Stairs: Yes: Internal  PLOF: Independent  PATIENT GOALS: maintain independence  OBJECTIVE:  Note: Objective measures were completed at Evaluation unless otherwise noted.  HAND DOMINANCE:  Right  ADLs: Overall ADLs: mod I with all basic ADLs. Transfers/ambulation related to ADLs: Eating: mod I Grooming: mod I UB Dressing: mod I LB Dressing: mod I Toileting: mod  Bathing: mod I Tub Shower transfers: mod I   IADLs:Pt reports she is mod I with all home management S Handwriting: 100% legible, however pt reports micrographia as she writes  for longer time period.  MOBILITY STATUS: Independent     FUNCTIONAL OUTCOME MEASURES: Fastening/unfastening 3 buttons: 41.40 Physical performance test: PPT#2 (simulated eating) 14.75 secs  & PPT#4 (donning/doffing jacket): 22.42 secs  COORDINATION: 9 Hole Peg test: Right: 30.76 sec; Left: 32.94 sec Box and Blocks:  Right 36 blocks, Left 50 blocks  UE ROM:   shoulder flexion RUE 140, LUE 140, Elbow extension RUE -10, LUE -5    SENSATION: WFL  MUSCLE TONE: RUE: Rigidity  COGNITION: Overall cognitive status: Within functional limits for tasks assessed  OBSERVATIONS: Bradykinesia                                                                                                                    TREATMENT DATE: 07/27/23- eval     PATIENT EDUCATION: Education details: role of OT, potential goals  Person educated: Patient Education method: Explanation Education comprehension: verbalized understanding  HOME EXERCISE PROGRAM: not issued yet  GOALS: Goals reviewed with patient? Yes  SHORT TERM GOALS: Target date: 08/27/23  I with PD specific HEP  Goal status: INITIAL  2. Pt will demonstrate increased ease with dressing as evidenced by decreasing PPT#4(don/ doff jacket) to 19 secs or less Baseline: 22.42 sec Goal status: INITIAL  3.  Pt will write a short paragraph with 100% legibility and no significant decrease in letter size Baseline, pt reports micrographia when writing a paragraph Goal status: INITIAL  4.  Pt will demonstrate improved RUE functional use as evidenced by increasing box/ blocks score to 40  blocks or greater,  Goal status: INITIAL  5.  I with adapted strategies for ADLS to maximize pt safety and I.   Goal status: INITIAL   LONG TERM GOALS: Target date: 10/19/23  Pt will demonstrate improved ease with fastening buttons as evidenced by decreasing 3 button/ unbutton time to :38 secs or less  Baseline: 41.40 Goal status: INITIAL  2.   Pt will verbalize understanding of ways to  prevent future PD related complications and PD community resources.        Goal status: INITIAL  3.  Pt will demonstrate improved ease with feeding as evidenced by decreasing PPT#2 to 11 secs or less. Baseline: 14.75 Goal status: INITIAL  ASSESSMENT:  CLINICAL IMPRESSION: Patient is a 68 y.o. female who was seen today for occupational therapy evaluation for Parkinsonism. PMH HTN, Osteoporosis, Anxiety Pt is very active, she reports weightlifting, yoga and PD cycle.  PERFORMANCE DEFICITS: in functional skills including ADLs, IADLs, coordination, dexterity, tone, ROM, flexibility, Fine motor control, Gross motor control, mobility, balance, decreased knowledge of use of DME, and UE functional use, brafykinesia, rigidity, and psychosocial skills including coping strategies, environmental adaptation, habits, interpersonal interactions, and routines and behaviors.   IMPAIRMENTS: are limiting patient from ADLs, IADLs, rest and sleep, play, leisure, and social participation.   COMORBIDITIES:  may have co-morbidities  that affects occupational performance. Patient will benefit from skilled OT to address above impairments and improve overall function.  MODIFICATION OR ASSISTANCE TO COMPLETE EVALUATION: No modification of tasks or assist necessary to complete an evaluation.  OT OCCUPATIONAL PROFILE AND HISTORY: Detailed assessment: Review of records and additional review of physical, cognitive, psychosocial history related to current functional performance.  CLINICAL DECISION MAKING: LOW - limited treatment  options, no task modification necessary  REHAB POTENTIAL: Good  EVALUATION COMPLEXITY: Low    PLAN:  OT FREQUENCY: 2x/week  OT DURATION: 12 weeks (anticipate d/c after 8 weeks dependent on progress)  PLANNED INTERVENTIONS: 02831 OT Re-evaluation, 97535 self care/ADL training, 02889 therapeutic exercise, 97530 therapeutic activity, 97112 neuromuscular re-education, 97140 manual therapy, 97035 ultrasound, 97018 paraffin, 02989 moist heat, 97010 cryotherapy, 97034 contrast bath, passive range of motion, balance training, energy conservation, coping strategies training, patient/family education, and DME and/or AE instructions  RECOMMENDED OTHER SERVICES: PT  CONSULTED AND AGREED WITH PLAN OF CARE: Patient  PLAN FOR NEXT SESSION: PWR! moves, standing or seated, PWR! hands, safety recommendations for exercise(pt is using weights)   Ignatius Kloos, OT 07/27/2023, 5:04 PM

## 2023-07-27 ENCOUNTER — Ambulatory Visit: Payer: Medicare HMO | Admitting: Physical Therapy

## 2023-07-27 ENCOUNTER — Encounter: Payer: Self-pay | Admitting: Physical Therapy

## 2023-07-27 ENCOUNTER — Ambulatory Visit: Payer: Medicare HMO | Attending: Neurology | Admitting: Occupational Therapy

## 2023-07-27 ENCOUNTER — Encounter: Payer: Self-pay | Admitting: Occupational Therapy

## 2023-07-27 DIAGNOSIS — M25621 Stiffness of right elbow, not elsewhere classified: Secondary | ICD-10-CM | POA: Insufficient documentation

## 2023-07-27 DIAGNOSIS — R2689 Other abnormalities of gait and mobility: Secondary | ICD-10-CM | POA: Insufficient documentation

## 2023-07-27 DIAGNOSIS — R2681 Unsteadiness on feet: Secondary | ICD-10-CM | POA: Insufficient documentation

## 2023-07-27 DIAGNOSIS — R29818 Other symptoms and signs involving the nervous system: Secondary | ICD-10-CM | POA: Insufficient documentation

## 2023-07-27 DIAGNOSIS — R262 Difficulty in walking, not elsewhere classified: Secondary | ICD-10-CM | POA: Insufficient documentation

## 2023-07-27 DIAGNOSIS — G20A1 Parkinson's disease without dyskinesia, without mention of fluctuations: Secondary | ICD-10-CM | POA: Diagnosis present

## 2023-07-27 DIAGNOSIS — G20C Parkinsonism, unspecified: Secondary | ICD-10-CM | POA: Insufficient documentation

## 2023-07-27 DIAGNOSIS — R278 Other lack of coordination: Secondary | ICD-10-CM | POA: Insufficient documentation

## 2023-07-27 NOTE — Therapy (Signed)
 OUTPATIENT PHYSICAL THERAPY NEURO EVALUATION   Patient Name: Anita Schmidt MRN: [id number] DOB:[birth year], 68 y.o., female Today's Date: 07/27/2023   PCP: Pura Lenis, MD REFERRING PROVIDER: Onita Duos, MD   END OF SESSION:  PT End of Session - 07/27/23 1735     Visit Number 1    Date for PT Re-Evaluation 10/05/23    PT Start Time 1545    PT Stop Time 1625    PT Time Calculation (min) 40 min    Activity Tolerance Patient tolerated treatment well    Behavior During Therapy WFL for tasks assessed/performed             Past Medical History:  Diagnosis Date   Anxiety    severe   Anxiety disorder    Basal cell carcinoma 01/12/2011   BCC LEFT SIDE OF NOSE TX=MOHS   Fibrocystic breast disease    GERD (gastroesophageal reflux disease)    triggered with anxiety-not treated with meds   Gilbert's syndrome    Heart murmur    slight murmur dx by MD- no treatment-EKG clear   Hemochromatosis    High blood pressure    on meds   Hyperlipidemia    diet controlled   Skin cancer, basal cell    nose   Past Surgical History:  Procedure Laterality Date   BREAST BIOPSY Bilateral    COLONOSCOPY     colonscopy  2011   Tics ; Dr Jakie   DILATION AND CURETTAGE OF UTERUS     INGUINAL HERNIA REPAIR     MOHS SURGERY  2012   nose   TONSILLECTOMY     Tracer Pellets  2003   in breast; [facility]   WISDOM TOOTH EXTRACTION     Patient Active Problem List   Diagnosis Date Noted   Hyperreflexia 05/31/2023   Parkinsonism (HCC) 05/31/2023   Right leg weakness 01/07/2021   Weakness of right hip 01/07/2021   Affective disorder (HCC) 10/18/2019   Abnormal EKG 07/03/2018   Infraspinatus strain 04/20/2018   It band syndrome, left 04/20/2018   Cough 10/27/2017   Wheezing 10/27/2017   Anxiety 10/27/2017   Hyperglycemia 04/07/2017   Insect bite 04/07/2017   Cardiac murmur 12/29/2016   Pes anserine bursitis 10/19/2016   Scapular dyskinesis 10/19/2016   Preventative health care  03/11/2016   Generalized anxiety disorder 05/24/2013   Benign essential hypertension 05/23/2013   Diverticulosis of colon without hemorrhage 04/27/2013   Malignant basal cell neoplasm of skin 04/27/2013   Hemochromatosis    Bee sting allergy 03/26/2011   IBS 02/10/2010   Anxiety state 12/25/2008   DISC DISEASE, CERVICAL 11/19/2008   Allergic rhinitis 09/19/2008   Hyperlipidemia 08/01/2008   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 She has been receiving PT for that, but noted eventually that she was showing symptoms of Parkinson's and was tested. She does resistance training twice a week for fitness.  Pt accompanied by: self  PERTINENT HISTORY:  Per referring phaysican note: Ericha Whittingham is a 68 y.o. 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 Yes: NPRS  scale: patient did not state. Pain location: R shoulder Pain description: N/A Aggravating factors: certain motions Relieving factors: avoiding the aggravating movements.  PRECAUTIONS: Fall  RED FLAGS: None   WEIGHT BEARING RESTRICTIONS: No  FALLS: Has patient fallen in last 6 months? No  LIVING ENVIRONMENT: Lives with: lives alone Lives in: House/apartment Stairs: Yes: Internal: 14 steps; on left going up and External: 4 steps; bilateral but cannot reach both Has following equipment at home: None  PLOF: Independent  PATIENT GOALS: Patient would like to identify any problems, Parkinson's education to minimize the impact on her daily life.  OBJECTIVE:  Note: Objective measures were completed at Evaluation unless otherwise noted.  DIAGNOSTIC FINDINGS:  Cerv CT 12/24 IMPRESSION: 1. No acute intracranial process. 2. No acute fracture or traumatic listhesis in the cervical spine. 3. Multilevel degenerative changes in the cervical spine, with mild-to-moderate spinal canal stenosis at C5-C6 and mild spinal canal stenosis at C6-C7. 4. Multilevel neural foraminal narrowing, severe on the right at C5-C6 and moderate bilaterally at C3-C4 and on the left at C5-C6. 5. 1.9 cm hypodense lesion in the right thyroid  lobe, with areas of calcification. If this has not previously been evaluated, a non-emergent ultrasound of the thyroid  is recommended. (Reference: J Am Coll Radiol. 2015 Feb;12(2): 143-50) 6. 7 mm ground-glass nodule in the right lung. Initial follow-up with CT at 6 months is recommended to confirm persistence. If persistent, repeat CT is recommended every 2 years until 5 years of stability has been established.  COGNITION: Overall cognitive status: Within functional limits for tasks assessed   SENSATION: Patient denies any change  COORDINATION: Heel on shin WNL  EDEMA:  swelling  MUSCLE TONE: no rigidity noted  MUSCLE LENGTH: Hamstrings: Right 85 deg; Left 75  deg Thomas test: Mildly tight  POSTURE: rounded shoulders and R scapula winging, patient reports spinal curvature  LOWER EXTREMITY ROM:  WNL B    LOWER EXTREMITY MMT:  5/5 BLE  BED MOBILITY:  I  TRANSFERS: I RAMP:  I  CURB:  I  STAIRS: Level of Assistance: Modified independence Stair Negotiation Technique: Step to Pattern with Single Rail on Left Number of Stairs: 14  Comments: Patient reports that she does fine on steps as long as she uses a rail.  GAIT: Gait pattern: step through pattern, decreased arm swing- Right, decreased arm swing- Left, decreased step length- Right, and decreased step length- Left Distance walked: In clinic distances Assistive  device utilized: None Level of assistance: Complete Independence Comments: Patient reports that she tries to focus on longer steps.  FUNCTIONAL TESTS:  5 times sit to stand: 11.69 Timed up and go (TUG): 8.24, cog 8.84, dual 9.37 Functional gait assessment: 24/30                                                                                                                             TREATMENT DATE:  07/27/23 Education  PATIENT EDUCATION: Education details: POC Person educated: Patient Education method: Explanation Education comprehension: verbalized understanding  HOME EXERCISE PROGRAM: TBD  GOALS: Goals reviewed with patient? Yes  SHORT TERM GOALS: Target date: 08/16/23  I with initial HEP Baseline: Goal status: INITIAL  LONG TERM GOALS: Target date: 10/05/23  I with final HEP Baseline:  Goal status: INITIAL  2.  Patient will score 28/30 on FGA to demonstrate decreased fall risk Baseline:  Goal status: INITIAL  3.  Patient will achieve 100% on M-CTSIB Baseline:  Goal status: INITIAL  4.  Patient will demonstrate normalized gait pattern while ambulating at least 800' on level and unlevel surfaces including normalized step length, no shuffling or festination. I gait Baseline:  Goal status:  INITIAL  5.  Patient will demonstrate the ability to climb up or down 12 steps going step over step, no UE support Baseline:  Goal status: INITIAL  6.  Patient will identify appropriate activities and the appropriate balance of activities to address her Parkinson's with living her daily life.  Baseline: Currently reports that her treatment activities are a bit overwhelming. Goal status: INITIAL  ASSESSMENT:  CLINICAL IMPRESSION: Patient is a 68 y.o. who was seen today for physical therapy evaluation and treatment for Parkinsonism. She is very proactive and is already performing yoga, spinning classes and walks daily. Her strength is good and her balance is mildly impaired. She will benefit from PT to further assess higher level balance and to educate her to PWR moves and any additional activities she can perform to minimize the progress and effects of he Parkinson's and decrease her fall risk.  OBJECTIVE IMPAIRMENTS: Abnormal gait, decreased activity tolerance, decreased balance, decreased coordination, decreased endurance, difficulty walking, decreased ROM, decreased strength, impaired flexibility, and impaired tone.   ACTIVITY LIMITATIONS: carrying, lifting, bending, standing, squatting, stairs, transfers, and locomotion level  PARTICIPATION LIMITATIONS: meal prep, cleaning, laundry, driving, shopping, and community activity  PERSONAL FACTORS: Past/current experiences are also affecting patient's functional outcome.   REHAB POTENTIAL: Good  CLINICAL DECISION MAKING: Evolving/moderate complexity  EVALUATION COMPLEXITY: Moderate  PLAN:  PT FREQUENCY: 2x/week  PT DURATION: 10 weeks  PLANNED INTERVENTIONS: 97110-Therapeutic exercises, 97530- Therapeutic activity, 97112- Neuromuscular re-education, 97535- Self Care, 02859- Manual therapy, [phone]- Gait training, Patient/Family education, Balance training, Dry Needling, Joint mobilization, Spinal mobilization, Cryotherapy, and Moist  heat  PLAN FOR NEXT SESSION: Initiate HEP, M-CTSIB assessment   Devere CHRISTELLA Mean, DPT 07/27/2023, 7:02 PM

## 2023-08-02 ENCOUNTER — Ambulatory Visit: Payer: Medicare HMO | Admitting: Occupational Therapy

## 2023-08-02 ENCOUNTER — Ambulatory Visit: Payer: Medicare HMO | Admitting: Physical Therapy

## 2023-08-09 ENCOUNTER — Encounter: Payer: Self-pay | Admitting: Physical Therapy

## 2023-08-09 ENCOUNTER — Ambulatory Visit: Payer: Medicare HMO | Admitting: Physical Therapy

## 2023-08-09 ENCOUNTER — Encounter (INDEPENDENT_AMBULATORY_CARE_PROVIDER_SITE_OTHER): Payer: Medicare HMO | Admitting: Neurology

## 2023-08-09 DIAGNOSIS — G20A1 Parkinson's disease without dyskinesia, without mention of fluctuations: Secondary | ICD-10-CM

## 2023-08-09 DIAGNOSIS — R262 Difficulty in walking, not elsewhere classified: Secondary | ICD-10-CM

## 2023-08-09 DIAGNOSIS — R278 Other lack of coordination: Secondary | ICD-10-CM

## 2023-08-09 DIAGNOSIS — G20C Parkinsonism, unspecified: Secondary | ICD-10-CM | POA: Diagnosis not present

## 2023-08-09 DIAGNOSIS — R29818 Other symptoms and signs involving the nervous system: Secondary | ICD-10-CM | POA: Diagnosis not present

## 2023-08-09 DIAGNOSIS — R2681 Unsteadiness on feet: Secondary | ICD-10-CM

## 2023-08-09 NOTE — Therapy (Incomplete)
OUTPATIENT OCCUPATIONAL THERAPY [facility] treatment  Patient Name: Anita Schmidt MRN: [id number] DOB:[birth year], 68 y.o., female Today's Date: 08/10/2023  PCP: Dr. Everlene Other REFERRING PROVIDER: Dr. Terrace Arabia  END OF SESSION:  OT End of Session - 08/10/23 0942     Visit Number 2    Number of Visits 25    Date for OT Re-Evaluation 10/19/23    Authorization Type [employer]    Authorization Time Period 12 weeks    Authorization - Visit Number 2    Progress Note Due on Visit 10    OT Start Time [phone]    OT Stop Time 0930    OT Time Calculation (min) 39 min    Activity Tolerance Patient tolerated treatment well    Behavior During Therapy WFL for tasks assessed/performed              Past Medical History:  Diagnosis Date   Anxiety    severe   Anxiety disorder    Basal cell carcinoma 01/12/2011   BCC LEFT SIDE OF NOSE TX=MOHS   Fibrocystic breast disease    GERD (gastroesophageal reflux disease)    triggered with anxiety-not treated with meds   Gilbert's syndrome    Heart murmur    slight murmur dx by MD- no treatment-EKG clear   Hemochromatosis    High blood pressure    on meds   Hyperlipidemia    diet controlled   Skin cancer, basal cell    nose   Past Surgical History:  Procedure Laterality Date   BREAST BIOPSY Bilateral    COLONOSCOPY     colonscopy  2011   Tics ; Dr Jarold Motto   DILATION AND CURETTAGE OF UTERUS     INGUINAL HERNIA REPAIR     MOHS SURGERY  2012   nose   TONSILLECTOMY     Tracer Pellets  2003   in breast; [facility]   WISDOM TOOTH EXTRACTION     Patient Active Problem List   Diagnosis Date Noted   Hyperreflexia 05/31/2023   Parkinsonism (HCC) 05/31/2023   Right leg weakness 01/07/2021   Weakness of right hip 01/07/2021   Affective disorder (HCC) 10/18/2019   Abnormal EKG 07/03/2018   Infraspinatus strain 04/20/2018   It band syndrome, left 04/20/2018   Cough 10/27/2017   Wheezing 10/27/2017   Anxiety 10/27/2017   Hyperglycemia  04/07/2017   Insect bite 04/07/2017   Cardiac murmur 12/29/2016   Pes anserine bursitis 10/19/2016   Scapular dyskinesis 10/19/2016   Preventative health care 03/11/2016   Generalized anxiety disorder 05/24/2013   Benign essential hypertension 05/23/2013   Diverticulosis of colon without hemorrhage 04/27/2013   Malignant basal cell neoplasm of skin 04/27/2013   Hemochromatosis    Bee sting allergy 03/26/2011   IBS 02/10/2010   Anxiety state 12/25/2008   DISC DISEASE, CERVICAL 11/19/2008   Allergic rhinitis 09/19/2008   Hyperlipidemia 08/01/2008   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   MENOPAUSAL SYNDROME 08/01/2008   Personal history of disease 08/01/2008    ONSET DATE: 07/04/23  REFERRING DIAG:  Diagnosis  G20.C (ICD-10-CM) - Parkinsonism, unspecified Parkinsonism type (HCC)    THERAPY DIAG:  Other lack of coordination  Other symptoms and signs involving the nervous system  Stiffness of right elbow, not elsewhere classified  Other abnormalities of gait and mobility  Rationale for Evaluation and Treatment: Rehabilitation  SUBJECTIVE:   SUBJECTIVE STATEMENT: Pt denies pain Pt accompanied by: self  PERTINENT HISTORY:  Parkinsonism, recently diagnosed  November 2024, however  symptomatic with decreased arm swing for several years. PMHx of HTN, ostoporosis, anxiety    PRECAUTIONS: Fall  WEIGHT BEARING RESTRICTIONS: No  PAIN:  Are you having pain? No  FALLS: Has patient fallen in last 6 months? No  LIVING ENVIRONMENT: Lives with: lives alone Lives in: House/apartment Stairs: Yes: Internal  PLOF: Independent  PATIENT GOALS: maintain independence  OBJECTIVE:  Note: Objective measures were completed at Evaluation unless otherwise noted.  HAND DOMINANCE: Right  ADLs: Overall ADLs: mod I with all basic ADLs. Transfers/ambulation related to ADLs: Eating: mod I Grooming: mod I UB Dressing: mod I LB Dressing: mod I Toileting: mod  Bathing: mod  I Tub Shower transfers: mod I   IADLs:Pt reports she is mod I with all home management S Handwriting: 100% legible, however pt reports micrographia as she writes  for longer time period.  MOBILITY STATUS: Independent     FUNCTIONAL OUTCOME MEASURES: Fastening/unfastening 3 buttons: 41.40 Physical performance test: PPT#2 (simulated eating) 14.75 secs  & PPT#4 (donning/doffing jacket): 22.42 secs  COORDINATION: 9 Hole Peg test: Right: 30.76 sec; Left: 32.94 sec Box and Blocks:  Right 36 blocks, Left 50 blocks  UE ROM:   shoulder flexion RUE 140, LUE 140, Elbow extension RUE -10, LUE -5    SENSATION: WFL  MUSCLE TONE: RUE: Rigidity  COGNITION: Overall cognitive status: Within functional limits for tasks assessed  OBSERVATIONS: Bradykinesia                                                                                                                    TREATMENT DATE: 08/10/23- see pt. education Handwiring actiives with continuous"l" and education regarding positioning  07/27/23- eval     PATIENT EDUCATION:08/10/23 Education details: PWR! moves quadraped, 10 reps each, min-mod v.c and demonstration, v.c for positioning to minimize overuse of wrists and pt was shown modifications, PWR! hands basic 4,  10 reps each, min-mod v.c , handwriting strateiges with practice Person educated: Patient Education method: Explanation, demonstration, handouts, v.c  Education comprehension: verbalized understanding, returned demonstration  HOME EXERCISE PROGRAM: PWR hands, PWR! bsic 4 modified quadraped-08/10/23 handwriting with PD- 08/10/23  GOALS: Goals reviewed with patient? Yes  SHORT TERM GOALS: Target date: 08/27/23  I with PD specific HEP  Goal status:  ongoing  2. Pt will demonstrate increased ease with dressing as evidenced by decreasing PPT#4(don/ doff jacket) to 19 secs or less Baseline: 22.42 sec Goal status:  ongoing  3.  Pt will write a short paragraph with 100%  legibility and no significant decrease in letter size Baseline, pt reports micrographia when writing a paragraph Goal status: ongpoing  4.  Pt will demonstrate improved RUE functional use as evidenced by increasing box/ blocks score to 40 blocks or greater,  Goal status: ongoing  5.  I with adapted strategies for ADLS to maximize pt safety and I.   Goal status: ongoing   LONG TERM GOALS: Target date: 10/19/23  Pt will demonstrate improved ease with fastening buttons as evidenced by decreasing  3 button/ unbutton time to :38 secs or less  Baseline: 41.40 Goal status: INITIAL  2.   Pt will verbalize understanding of ways to prevent future PD related complications and PD community resources.        Goal status: INITIAL  3.  Pt will demonstrate improved ease with feeding as evidenced by decreasing PPT#2 to 11 secs or less. Baseline: 14.75 Goal status: INITIAL  ASSESSMENT:  CLINICAL IMPRESSION: Pt. is progressing towards goals. She demonstrates understanding of inital HEPs following education. she will benefit from reinforcement of large amplitude movements. Pt was encourged to consistenlty brush teeth with RUE rather than switching to LUE.  PERFORMANCE DEFICITS: in functional skills including ADLs, IADLs, coordination, dexterity, tone, ROM, flexibility, Fine motor control, Gross motor control, mobility, balance, decreased knowledge of use of DME, and UE functional use, brafykinesia, rigidity, and psychosocial skills including coping strategies, environmental adaptation, habits, interpersonal interactions, and routines and behaviors.   IMPAIRMENTS: are limiting patient from ADLs, IADLs, rest and sleep, play, leisure, and social participation.   COMORBIDITIES:  may have co-morbidities  that affects occupational performance. Patient will benefit from skilled OT to address above impairments and improve overall function.  MODIFICATION OR ASSISTANCE TO COMPLETE EVALUATION: No modification of  tasks or assist necessary to complete an evaluation.  OT OCCUPATIONAL PROFILE AND HISTORY: Detailed assessment: Review of records and additional review of physical, cognitive, psychosocial history related to current functional performance.  CLINICAL DECISION MAKING: LOW - limited treatment options, no task modification necessary  REHAB POTENTIAL: Good  EVALUATION COMPLEXITY: Low    PLAN:  OT FREQUENCY: 2x/week  OT DURATION: 12 weeks (anticipate d/c after 8 weeks dependent on progress)  PLANNED INTERVENTIONS: 86578 OT Re-evaluation, 97535 self care/ADL training, 46962 therapeutic exercise, 97530 therapeutic activity, 97112 neuromuscular re-education, 97140 manual therapy, 97035 ultrasound, 97018 paraffin, 95284 moist heat, 97010 cryotherapy, 97034 contrast bath, passive range of motion, balance training, energy conservation, coping strategies training, patient/family education, and DME and/or AE instructions  RECOMMENDED OTHER SERVICES: PT  CONSULTED AND AGREED WITH PLAN OF CARE: Patient  PLAN FOR NEXT SESSION:  reinforce big movement strategies   Zahari Fazzino, OT 08/10/2023, 9:43 AM

## 2023-08-09 NOTE — Therapy (Signed)
OUTPATIENT PHYSICAL THERAPY NEURO EVALUATION   Patient Name: Anita Schmidt MRN: [id number] DOB:[birth year], 68 y.o., female Today's Date: 08/09/2023   PCP: Anita Harries, MD REFERRING PROVIDER: Levert Feinstein, MD   END OF SESSION:  PT End of Session - 08/09/23 1237     Visit Number 2    Date for PT Re-Evaluation 10/05/23    PT Start Time 1230    PT Stop Time 1310    PT Time Calculation (min) 40 min    Activity Tolerance Patient tolerated treatment well    Behavior During Therapy WFL for tasks assessed/performed            Past Medical History:  Diagnosis Date   Anxiety    severe   Anxiety disorder    Basal cell carcinoma 01/12/2011   BCC LEFT SIDE OF NOSE TX=MOHS   Fibrocystic breast disease    GERD (gastroesophageal reflux disease)    triggered with anxiety-not treated with meds   Gilbert's syndrome    Heart murmur    slight murmur dx by MD- no treatment-EKG clear   Hemochromatosis    High blood pressure    on meds   Hyperlipidemia    diet controlled   Skin cancer, basal cell    nose   Past Surgical History:  Procedure Laterality Date   BREAST BIOPSY Bilateral    COLONOSCOPY     colonscopy  2011   Tics ; Anita Schmidt   DILATION AND CURETTAGE OF UTERUS     INGUINAL HERNIA REPAIR     MOHS SURGERY  2012   nose   TONSILLECTOMY     Tracer Pellets  2003   in breast; Anita Schmidt   WISDOM TOOTH EXTRACTION     Patient Active Problem List   Diagnosis Date Noted   Hyperreflexia 05/31/2023   Parkinsonism (HCC) 05/31/2023   Right leg weakness 01/07/2021   Weakness of right hip 01/07/2021   Affective disorder (HCC) 10/18/2019   Abnormal EKG 07/03/2018   Infraspinatus strain 04/20/2018   It band syndrome, left 04/20/2018   Cough 10/27/2017   Wheezing 10/27/2017   Anxiety 10/27/2017   Hyperglycemia 04/07/2017   Insect bite 04/07/2017   Cardiac murmur 12/29/2016   Pes anserine bursitis 10/19/2016   Scapular dyskinesis 10/19/2016   Preventative health care  03/11/2016   Generalized anxiety disorder 05/24/2013   Benign essential hypertension 05/23/2013   Diverticulosis of colon without hemorrhage 04/27/2013   Malignant basal cell neoplasm of skin 04/27/2013   Hemochromatosis    Bee sting allergy 03/26/2011   IBS 02/10/2010   Anxiety state 12/25/2008   DISC DISEASE, CERVICAL 11/19/2008   Allergic rhinitis 09/19/2008   Hyperlipidemia 08/01/2008   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 She has been receiving PT for that, but noted eventually that she was showing symptoms of Parkinson's and was tested. She does resistance training twice a week for fitness.  Pt accompanied by: self  PERTINENT HISTORY:  Per referring phaysican note: Anita Schmidt is a 68 y.o. female   Parkinsonism             Involving right more than left, most likely idiopathic Parkinson's disease             CT head without contrast, claustrophobia             Laboratory evaluation including thyroid functional test,             Azilect 0.5 mg daily Hyperreflexia on examination, bilateral Babinski signs, x-ray of cervical spine showed degenerative changes             CT cervical spine, claustrophobia to MRI Anxiety  PAIN:  Are  you having pain? Yes: NPRS scale: patient did not state. Pain location: R shoulder Pain description: N/A Aggravating factors: certain motions Relieving factors: avoiding the aggravating movements.  PRECAUTIONS: Fall  RED FLAGS: None   WEIGHT BEARING RESTRICTIONS: No  FALLS: Has patient fallen in last 6 months? No  LIVING ENVIRONMENT: Lives with: lives alone Lives in: House/apartment Stairs: Yes: Internal: 14 steps; on left going up and External: 4 steps; bilateral but cannot reach both Has following equipment at home: None  PLOF: Independent  PATIENT GOALS: Patient would like to identify any problems, Parkinson's education to minimize the impact on her daily life.  OBJECTIVE:  Note: Objective measures were completed at Evaluation unless otherwise noted.  DIAGNOSTIC FINDINGS:  Cerv CT 12/24 IMPRESSION: 1. No acute intracranial process. 2. No acute fracture or traumatic listhesis in the cervical spine. 3. Multilevel degenerative changes in the cervical spine, with mild-to-moderate spinal canal stenosis at C5-C6 and mild spinal canal stenosis at C6-C7. 4. Multilevel neural foraminal narrowing, severe on the right at C5-C6 and moderate bilaterally at C3-C4 and on the left at C5-C6. 5. 1.9 cm hypodense lesion in the right thyroid lobe, with areas of calcification. If this has not previously been evaluated, a non-emergent ultrasound of the thyroid is recommended. (Reference: J Am Coll Radiol. 2015 Feb;12(2): 143-50) 6. 7 mm ground-glass nodule in the right lung. Initial follow-up with CT at 6 months is recommended to confirm persistence. If persistent, repeat CT is recommended every 2 years until 5 years of stability has been established.  COGNITION: Overall cognitive status: Within functional limits for tasks assessed   SENSATION: Patient denies any change  COORDINATION: Heel on shin WNL  EDEMA:  swelling  MUSCLE TONE: no rigidity noted  MUSCLE  LENGTH: Hamstrings: Right 85 deg; Left 75 deg Thomas test: Mildly tight  POSTURE: rounded shoulders and R scapula winging, patient reports spinal curvature  LOWER EXTREMITY ROM:  WNL B    LOWER EXTREMITY MMT:  5/5 BLE  BED MOBILITY:  I  TRANSFERS: I RAMP:  I  CURB:  I  STAIRS: Level of Assistance: Modified independence Stair Negotiation Technique: Step to Pattern with Single Rail on Left Number of Stairs: 14  Comments: Patient reports that she does fine on steps as long as she uses a rail.  GAIT: Gait pattern: step through pattern, decreased arm swing- Right, decreased arm swing- Left, decreased step length- Right, and decreased step length- Left Distance walked: In clinic distances Assistive  device utilized: None Level of assistance: Complete Independence Comments: Patient reports that she tries to focus on longer steps.  FUNCTIONAL TESTS:  5 times sit to stand: 11.69 Timed up and go (TUG): 8.24, cog 8.84, dual 9.37 Functional gait assessment: 24/30                                                                                                                             TREATMENT DATE:  08/09/23 Bike L3 x 5 minutes M-CTSIB screen- Completed 1 x 30 sec at each level, S, mildly increased sway with eyes closed on level ground and on airex, but no LOB PWR moves- standing, x 10 reps each SLS pivots to touch mat and return to start at approximately 24", then 20", slightly more difficulty in L stance Heel raises on step, B, x 20 reps, with UE support  07/27/23 Education  PATIENT EDUCATION: Education details: POC Person educated: Patient Education method: Explanation Education comprehension: verbalized understanding  HOME EXERCISE PROGRAM: Standing PWR moves 08/09/23  GOALS: Goals reviewed with patient? Yes  SHORT TERM GOALS: Target date: 08/16/23  I with initial HEP Baseline: Goal status: 08/08/22-Provided patient with written PWR move instructions for standing  activities, to try them at home, ongoing  LONG TERM GOALS: Target date: 10/05/23  I with final HEP Baseline:  Goal status: INITIAL  2.  Patient will score 28/30 on FGA to demonstrate decreased fall risk Baseline:  Goal status: INITIAL  3.  Patient will achieve 100% on M-CTSIB Baseline:  Goal status: 08/09/23-passed screen, met  4.  Patient will demonstrate normalized gait pattern while ambulating at least 800' on level and unlevel surfaces including normalized step length, no shuffling or festination. I gait Baseline:  Goal status: INITIAL  5.  Patient will demonstrate the ability to climb up or down 12 steps going step over step, no UE support Baseline:  Goal status: INITIAL  6.  Patient will identify appropriate activities and the appropriate balance of activities to address her Parkinson's with living her daily life.  Baseline: Currently reports that her treatment activities are a bit overwhelming. Goal status: INITIAL  ASSESSMENT:  CLINICAL IMPRESSION: Patient arrives stating she has had sinus issues and has not exercised in a week. She feels she may have some dust in her home causing the issue because she feels better when she gets out. Her breathing improved through today's session. Initiated standing PWR moves, able to complete each one x 10 reps. Provided a lot of education on activity level as she seems to be at risk for doing too much between yoga, spin classes and weight classes. In discussion with the patient, she does appear to be paying attention to her fatigue, but admits that she frequently experiences mental fatigue in the morning due to depression and her Parkinson's medication. Encouraged her to take a rest day at leas 1 day a week.  OBJECTIVE IMPAIRMENTS: Abnormal gait, decreased activity tolerance, decreased balance, decreased coordination,  decreased endurance, difficulty walking, decreased ROM, decreased strength, impaired flexibility, and impaired tone.    ACTIVITY LIMITATIONS: carrying, lifting, bending, standing, squatting, stairs, transfers, and locomotion level  PARTICIPATION LIMITATIONS: meal prep, cleaning, laundry, driving, shopping, and community activity  PERSONAL FACTORS: Past/current experiences are also affecting patient's functional outcome.   REHAB POTENTIAL: Good  CLINICAL DECISION MAKING: Evolving/moderate complexity  EVALUATION COMPLEXITY: Moderate  PLAN:  PT FREQUENCY: 2x/week  PT DURATION: 10 weeks  PLANNED INTERVENTIONS: 97110-Therapeutic exercises, 97530- Therapeutic activity, 97112- Neuromuscular re-education, 97535- Self Care, 46962- Manual therapy, [phone]- Gait training, Patient/Family education, Balance training, Dry Needling, Joint mobilization, Spinal mobilization, Cryotherapy, and Moist heat  PLAN FOR NEXT SESSION: Initiate HEP, M-CTSIB assessment   Iona Beard, DPT 08/09/2023, 1:24 PM

## 2023-08-10 ENCOUNTER — Ambulatory Visit: Payer: Medicare HMO

## 2023-08-10 ENCOUNTER — Ambulatory Visit: Payer: Medicare HMO | Admitting: Occupational Therapy

## 2023-08-10 ENCOUNTER — Other Ambulatory Visit: Payer: Self-pay

## 2023-08-10 DIAGNOSIS — R29818 Other symptoms and signs involving the nervous system: Secondary | ICD-10-CM

## 2023-08-10 DIAGNOSIS — M25621 Stiffness of right elbow, not elsewhere classified: Secondary | ICD-10-CM

## 2023-08-10 DIAGNOSIS — R2689 Other abnormalities of gait and mobility: Secondary | ICD-10-CM

## 2023-08-10 DIAGNOSIS — R278 Other lack of coordination: Secondary | ICD-10-CM

## 2023-08-10 DIAGNOSIS — R262 Difficulty in walking, not elsewhere classified: Secondary | ICD-10-CM

## 2023-08-10 DIAGNOSIS — G20A1 Parkinson's disease without dyskinesia, without mention of fluctuations: Secondary | ICD-10-CM

## 2023-08-10 NOTE — Patient Instructions (Signed)
PWR! Hands  With arms stretched out in front of you (elbows straight), perform the following: PWR! Rock: Move wrists up and down BIG PWR! Twist: Twist palms up and down BIG  Then, start with elbows bent and hands closed. PWR! Step: Touch index finger to thumb while keeping other fingers straight. Flick fingers out BIG (thumb out/straighten fingers). Repeat with other fingers. (Step your thumb to each finger). PWR! Hands: Push hands out BIG. Elbows straight, wrists up, fingers open and spread apart BIG. (Can also perform by pushing down on table, chair, knees. Push above head, out to the side, behind you, in front of you.)   ** Make each movement big and deliberate so that you feel the movement.  Perform at least 10 repetitions 1x/day, but perform PWR! hands throughout the day when you are having trouble using your hands (picking up/manipulating small objects, writing, eating, typing, sewing, buttoning, etc.). 

## 2023-08-10 NOTE — Therapy (Signed)
OUTPATIENT PHYSICAL THERAPY NEURO TREATMENT   Patient Name: Anita Schmidt MRN: [id number] DOB:Jun 14, 1956, 68 y.o., female Today's Date: 08/10/2023   PCP: Tracey Harries, MD REFERRING PROVIDER: Levert Feinstein, MD   END OF SESSION:  PT End of Session - 08/10/23 0932     Visit Number 3    Date for PT Re-Evaluation 10/05/23    Progress Note Due on Visit 10    PT Start Time 0932    PT Stop Time 1015    PT Time Calculation (min) 43 min    Activity Tolerance Patient tolerated treatment well    Behavior During Therapy WFL for tasks assessed/performed             Past Medical History:  Diagnosis Date   Anxiety    severe   Anxiety disorder    Basal cell carcinoma 01/12/2011   BCC LEFT SIDE OF NOSE TX=MOHS   Fibrocystic breast disease    GERD (gastroesophageal reflux disease)    triggered with anxiety-not treated with meds   Gilbert's syndrome    Heart murmur    slight murmur dx by MD- no treatment-EKG clear   Hemochromatosis    High blood pressure    on meds   Hyperlipidemia    diet controlled   Skin cancer, basal cell    nose   Past Surgical History:  Procedure Laterality Date   BREAST BIOPSY Bilateral    COLONOSCOPY     colonscopy  2011   Tics ; Dr Jarold Motto   DILATION AND CURETTAGE OF UTERUS     INGUINAL HERNIA REPAIR     MOHS SURGERY  2012   nose   TONSILLECTOMY     Tracer Pellets  2003   in breast; [facility]   WISDOM TOOTH EXTRACTION     Patient Active Problem List   Diagnosis Date Noted   Hyperreflexia 05/31/2023   Parkinsonism (HCC) 05/31/2023   Right leg weakness 01/07/2021   Weakness of right hip 01/07/2021   Affective disorder (HCC) 10/18/2019   Abnormal EKG 07/03/2018   Infraspinatus strain 04/20/2018   It band syndrome, left 04/20/2018   Cough 10/27/2017   Wheezing 10/27/2017   Anxiety 10/27/2017   Hyperglycemia 04/07/2017   Insect bite 04/07/2017   Cardiac murmur 12/29/2016   Pes anserine bursitis 10/19/2016   Scapular dyskinesis  10/19/2016   Preventative health care 03/11/2016   Generalized anxiety disorder 05/24/2013   Benign essential hypertension 05/23/2013   Diverticulosis of colon without hemorrhage 04/27/2013   Malignant basal cell neoplasm of skin 04/27/2013   Hemochromatosis    Bee sting allergy 03/26/2011   IBS 02/10/2010   Anxiety state 12/25/2008   DISC DISEASE, CERVICAL 11/19/2008   Allergic rhinitis 09/19/2008   Hyperlipidemia 08/01/2008   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   MENOPAUSAL SYNDROME 08/01/2008   Personal history of disease 08/01/2008    ONSET DATE: 07/24/23  REFERRING DIAG: G20.C (ICD-10-CM) - Parkinsonism, unspecified Parkinsonism type (HCC)   THERAPY DIAG:  Other symptoms and signs involving the nervous system  Other lack of coordination  Stiffness of right elbow, not elsewhere classified  Difficulty in walking, not elsewhere classified  Parkinson's disease without dyskinesia, unspecified whether manifestations fluctuate (HCC)  Rationale for Evaluation and Treatment: Rehabilitation  SUBJECTIVE:  SUBJECTIVE STATEMENT: chief concern is of R arm weakness and coordination.   Patient reports that she noted R arm was not functioning normally. She has been receiving PT for that, but noted eventually that she was showing symptoms of Parkinson's and was tested. She does resistance training twice a week for fitness.  Pt accompanied by: self  PERTINENT HISTORY:  Per referring phaysican note: Madsion Schmidt is a 68 y.o. 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 Yes: NPRS scale: patient did not state. Pain location: R shoulder Pain description: N/A Aggravating factors: certain motions Relieving factors: avoiding the aggravating movements.  PRECAUTIONS: Fall  RED FLAGS: None   WEIGHT BEARING RESTRICTIONS: No  FALLS: Has patient fallen in last 6 months? No  LIVING ENVIRONMENT: Lives with: lives alone Lives in: House/apartment Stairs: Yes: Internal: 14 steps; on left going up and External: 4 steps; bilateral but cannot reach both Has following equipment at home: None  PLOF: Independent  PATIENT GOALS: Patient would like to identify any problems, Parkinson's education to minimize the impact on her daily life.  OBJECTIVE:  Note: Objective measures were completed at Evaluation unless otherwise noted.  DIAGNOSTIC FINDINGS:  Cerv CT 12/24 IMPRESSION: 1. No acute intracranial process. 2. No acute fracture or traumatic listhesis in the cervical spine. 3. Multilevel degenerative changes in the cervical spine, with mild-to-moderate spinal canal stenosis at C5-C6 and mild spinal canal stenosis at C6-C7. 4. Multilevel neural foraminal narrowing, severe on the right at C5-C6 and moderate bilaterally at C3-C4 and on the left at C5-C6. 5. 1.9 cm hypodense lesion in the right thyroid lobe, with areas of calcification. If this has not previously been evaluated, a non-emergent ultrasound of the thyroid is recommended. (Reference: J Am Coll Radiol. 2015 Feb;12(2): 143-50) 6. 7 mm ground-glass nodule in the right lung. Initial follow-up with CT at 6 months is recommended to confirm persistence. If persistent, repeat CT is recommended every 2 years until 5 years of stability has been established.  COGNITION: Overall cognitive status: Within  functional limits for tasks assessed   SENSATION: Patient denies any change  COORDINATION: Heel on shin WNL  EDEMA:  swelling  MUSCLE TONE: no rigidity noted  MUSCLE LENGTH: Hamstrings: Right 85 deg; Left 75 deg Thomas test: Mildly tight  POSTURE: rounded shoulders and R scapula winging, patient reports spinal curvature  LOWER EXTREMITY ROM:  WNL B    LOWER EXTREMITY MMT:  5/5 BLE  BED MOBILITY:  I  TRANSFERS: I RAMP:  I  CURB:  I  STAIRS: Level of Assistance: Modified independence Stair Negotiation Technique: Step to Pattern with Single Rail on Left Number of Stairs: 14  Comments: Patient reports that she does fine on steps as long as she uses a rail.  GAIT: Gait pattern: step through pattern, decreased arm swing- Right, decreased arm swing- Left, decreased step length- Right,  and decreased step length- Left Distance walked: In clinic distances Assistive device utilized: None Level of assistance: Complete Independence Comments: Patient reports that she tries to focus on longer steps.  FUNCTIONAL TESTS:  5 times sit to stand: 11.69 Timed up and go (TUG): 8.24, cog 8.84, dual 9.37 Functional gait assessment: 24/30                                                                                                                             TREATMENT DATE:  08/10/23:  Nustep level 5, 5 min 30 sec, LE's only, did not like the UE movement, too abrupt Reviewed and practiced the PWR moves that were provided last session.  She performed well.  We discussed performing in front of mirror to monitor her position particularly for R arm Supine for manual stretching for B hamstrings 60 sec holds, once each leg Prone for R shoulder horizontal abd palm towards floor for engaging posterior shoulder musculature Practiced gait stepping over different height boxes, initially she tended to sling her leg around the box but corrected easily with instruction.  Single leg stance, able  to stand for over 30 sec each foot.  08/09/23 Bike L3 x 5 minutes M-CTSIB screen- Completed 1 x 30 sec at each level, S, mildly increased sway with eyes closed on level ground and on airex, but no LOB PWR moves- standing, x 10 reps each SLS pivots to touch mat and return to start at approximately 24", then 20", slightly more difficulty in L stance Heel raises on step, B, x 20 reps, with UE support  07/27/23 Education  PATIENT EDUCATION: Education details: POC Person educated: Patient Education method: Explanation Education comprehension: verbalized understanding  HOME EXERCISE PROGRAM: Standing PWR moves 08/09/23  GOALS: Goals reviewed with patient? Yes  SHORT TERM GOALS: Target date: 08/16/23  I with initial HEP Baseline: Goal status: 08/08/22-Provided patient with written PWR move instructions for standing activities, to try them at home, ongoing  LONG TERM GOALS: Target date: 10/05/23  I with final HEP Baseline:  Goal status: INITIAL  2.  Patient will score 28/30 on FGA to demonstrate decreased fall risk Baseline:  Goal status: INITIAL  3.  Patient will achieve 100% on M-CTSIB Baseline:  Goal status: 08/09/23-passed screen, met  4.  Patient will demonstrate normalized gait pattern while ambulating at least 800' on level and unlevel surfaces including normalized step length, no shuffling or festination. I gait Baseline:  Goal status: INITIAL  5.  Patient will demonstrate the ability to climb up or down 12 steps going step over step, no UE support Baseline:  Goal status: INITIAL  6.  Patient will identify appropriate activities and the appropriate balance of activities to address her Parkinson's with living her daily life.  Baseline: Currently reports that her treatment activities are a bit overwhelming. Goal status: INITIAL  ASSESSMENT:  CLINICAL IMPRESSION: Patient arrives stating she still has sinus issues and has not exercised in a  week. Provided a lot of  education on activity level again.  Advised by OT and PT to perform the standing power moves in am to engage pre learned movement patterns, to assist with motor control prior to performing her ADL's. She is going to try attending yoga again has missed several due to sinuses.  OBJECTIVE IMPAIRMENTS: Abnormal gait, decreased activity tolerance, decreased balance, decreased coordination, decreased endurance, difficulty walking, decreased ROM, decreased strength, impaired flexibility, and impaired tone.   ACTIVITY LIMITATIONS: carrying, lifting, bending, standing, squatting, stairs, transfers, and locomotion level  PARTICIPATION LIMITATIONS: meal prep, cleaning, laundry, driving, shopping, and community activity  PERSONAL FACTORS: Past/current experiences are also affecting patient's functional outcome.   REHAB POTENTIAL: Good  CLINICAL DECISION MAKING: Evolving/moderate complexity  EVALUATION COMPLEXITY: Moderate  PLAN:  PT FREQUENCY: 2x/week  PT DURATION: 10 weeks  PLANNED INTERVENTIONS: 97110-Therapeutic exercises, 97530- Therapeutic activity, 97112- Neuromuscular re-education, 97535- Self Care, 63875- Manual therapy, [phone]- Gait training, Patient/Family education, Balance training, Dry Needling, Joint mobilization, Spinal mobilization, Cryotherapy, and Moist heat  PLAN FOR NEXT SESSION: continue to progress with large amplitude movements, emphasis on slowed and controlled moves.  Trevyn Lumpkin Arta Silence, PT, DPT, OCS 08/10/2023, 12:16 PM

## 2023-08-11 NOTE — Telephone Encounter (Signed)
Please see the MyChart message reply(ies) for my assessment and plan.    This patient gave consent for this Medical Advice Message and is aware that it may result in a bill to [employer], as well as the possibility of receiving a bill for a co-payment or deductible. They are an established patient, but are not seeking medical advice exclusively about a problem treated during an in person or video visit in the last seven days. I did not recommend an in person or video visit within seven days of my reply.    I spent a total of 15 minutes cumulative time within 7 days through [employer].  Marcial Pacas, MD

## 2023-08-16 ENCOUNTER — Encounter: Payer: Self-pay | Admitting: Physical Therapy

## 2023-08-16 ENCOUNTER — Ambulatory Visit: Payer: Medicare HMO | Admitting: Occupational Therapy

## 2023-08-16 ENCOUNTER — Ambulatory Visit: Payer: Medicare HMO | Admitting: Physical Therapy

## 2023-08-16 DIAGNOSIS — R29818 Other symptoms and signs involving the nervous system: Secondary | ICD-10-CM

## 2023-08-16 DIAGNOSIS — R2689 Other abnormalities of gait and mobility: Secondary | ICD-10-CM

## 2023-08-16 DIAGNOSIS — R278 Other lack of coordination: Secondary | ICD-10-CM

## 2023-08-16 DIAGNOSIS — R262 Difficulty in walking, not elsewhere classified: Secondary | ICD-10-CM

## 2023-08-16 DIAGNOSIS — G20A1 Parkinson's disease without dyskinesia, without mention of fluctuations: Secondary | ICD-10-CM

## 2023-08-16 DIAGNOSIS — M25621 Stiffness of right elbow, not elsewhere classified: Secondary | ICD-10-CM

## 2023-08-16 DIAGNOSIS — R2681 Unsteadiness on feet: Secondary | ICD-10-CM

## 2023-08-16 NOTE — Therapy (Unsigned)
OUTPATIENT OCCUPATIONAL THERAPY PARKINSON'S treatment  Patient Name: Anita Schmidt MRN: [id number] DOB:04-Jul-1956, 68 y.o., female Today's Date: 08/17/2023  PCP: Dr. Everlene Other REFERRING PROVIDER: Dr. Terrace Arabia  END OF SESSION:  OT End of Session - 08/17/23 0905     Visit Number 3    Number of Visits 25    Date for OT Re-Evaluation 10/19/23    Authorization Time Period 12 weeks    Authorization - Visit Number 3    Progress Note Due on Visit 10    OT Start Time 1401    OT Stop Time 1444    OT Time Calculation (min) 43 min    Activity Tolerance Patient tolerated treatment well    Behavior During Therapy WFL for tasks assessed/performed               Past Medical History:  Diagnosis Date   Anxiety    severe   Anxiety disorder    Basal cell carcinoma 01/12/2011   BCC LEFT SIDE OF NOSE TX=MOHS   Fibrocystic breast disease    GERD (gastroesophageal reflux disease)    triggered with anxiety-not treated with meds   Gilbert's syndrome    Heart murmur    slight murmur dx by MD- no treatment-EKG clear   Hemochromatosis    High blood pressure    on meds   Hyperlipidemia    diet controlled   Skin cancer, basal cell    nose   Past Surgical History:  Procedure Laterality Date   BREAST BIOPSY Bilateral    COLONOSCOPY     colonscopy  2011   Tics ; Dr Jarold Motto   DILATION AND CURETTAGE OF UTERUS     INGUINAL HERNIA REPAIR     MOHS SURGERY  2012   nose   TONSILLECTOMY     Tracer Pellets  2003   in breast; [facility]   WISDOM TOOTH EXTRACTION     Patient Active Problem List   Diagnosis Date Noted   Hyperreflexia 05/31/2023   Parkinsonism (HCC) 05/31/2023   Right leg weakness 01/07/2021   Weakness of right hip 01/07/2021   Affective disorder (HCC) 10/18/2019   Abnormal EKG 07/03/2018   Infraspinatus strain 04/20/2018   It band syndrome, left 04/20/2018   Cough 10/27/2017   Wheezing 10/27/2017   Anxiety 10/27/2017   Hyperglycemia 04/07/2017   Insect bite 04/07/2017    Cardiac murmur 12/29/2016   Pes anserine bursitis 10/19/2016   Scapular dyskinesis 10/19/2016   Preventative health care 03/11/2016   Generalized anxiety disorder 05/24/2013   Benign essential hypertension 05/23/2013   Diverticulosis of colon without hemorrhage 04/27/2013   Malignant basal cell neoplasm of skin 04/27/2013   Hemochromatosis    Bee sting allergy 03/26/2011   IBS 02/10/2010   Anxiety state 12/25/2008   DISC DISEASE, CERVICAL 11/19/2008   Allergic rhinitis 09/19/2008   Hyperlipidemia 08/01/2008   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 hands Pt accompanied by: self  PERTINENT HISTORY:  Parkinsonism, recently diagnosed  November  2024, however symptomatic with decreased arm swing for several years. PMHx of HTN, ostoporosis, anxiety    PRECAUTIONS: Fall  WEIGHT BEARING RESTRICTIONS: No  PAIN:  Are you having pain? No  FALLS: Has patient fallen in last 6 months? No  LIVING ENVIRONMENT: Lives with: lives alone Lives in: House/apartment Stairs: Yes: Internal  PLOF: Independent  PATIENT GOALS: maintain independence  OBJECTIVE:  Note: Objective measures were completed at Evaluation unless otherwise noted.  HAND DOMINANCE: Right  ADLs: Overall ADLs: mod I with all basic ADLs. Transfers/ambulation related to ADLs: Eating: mod I Grooming: mod I UB Dressing: mod I LB Dressing: mod I Toileting: mod  Bathing:  mod I Tub Shower transfers: mod I   IADLs:Pt reports she is mod I with all home management S Handwriting: 100% legible, however pt reports micrographia as she writes  for longer time period.  MOBILITY STATUS: Independent     FUNCTIONAL OUTCOME MEASURES: Fastening/unfastening 3 buttons: 41.40 Physical performance test: PPT#2 (simulated eating) 14.75 secs  & PPT#4 (donning/doffing jacket): 22.42 secs  COORDINATION: 9 Hole Peg test: Right: 30.76 sec; Left: 32.94 sec Box and Blocks:  Right 36 blocks, Left 50 blocks  UE ROM:   shoulder flexion RUE 140, LUE 140, Elbow extension RUE -10, LUE -5    SENSATION: WFL  MUSCLE TONE: RUE: Rigidity  COGNITION: Overall cognitive status: Within functional limits for tasks assessed  OBSERVATIONS: Bradykinesia                                                                                                                    TREATMENT DATE: 08/16/23- Reviewed PWR! hands basic 4, 5- 10 reps each, min v.c and demonstration. Pt was instructed in coordination HEP, min-mod v.c for amplitude. Beginning discussion regarding big movements with ADL tasks. Pt practiced opening and closing containers with larger amplitude movement strategies.   08/10/23- see pt. education Handwriting actiives with continuous"l" and education regarding positioning  07/27/23- eval     PATIENT EDUCATION: Education details: PWR! hands basic 4, coordination HEP- see pt instructions, beginning education regarding big movment strategies with ADLs. Person educated: Patient Education method: Explanation, demonstration, handouts, v.c  Education comprehension: verbalized understanding, returned demonstration  HOME EXERCISE PROGRAM: PWR hands, PWR! basic 4 modified quadraped-08/10/23 handwriting with PD- 08/10/23 coordination 08/16/23  GOALS: Goals reviewed with patient? Yes  SHORT TERM GOALS: Target date: 08/27/23  I with PD specific HEP  Goal status:  ongoing  2. Pt  will demonstrate increased ease with dressing as evidenced by decreasing PPT#4(don/ doff jacket) to 19 secs or less Baseline: 22.42 sec Goal status:  ongoing  3.  Pt will write a short paragraph with 100% legibility and no significant decrease in letter size Baseline, pt reports micrographia when writing a paragraph Goal status: ongpoing  4.  Pt will demonstrate improved RUE functional use as evidenced by increasing box/ blocks score to 40 blocks or greater,  Goal status: ongoing  5.  I with adapted strategies for ADLS to maximize pt safety  and I.   Goal status: ongoing   LONG TERM GOALS: Target date: 10/19/23  Pt will demonstrate improved ease with fastening buttons as evidenced by decreasing 3 button/ unbutton time to :38 secs or less  Baseline: 41.40 Goal status: INITIAL  2.   Pt will verbalize understanding of ways to prevent future PD related complications and PD community resources.        Goal status: INITIAL  3.  Pt will demonstrate improved ease with feeding as evidenced by decreasing PPT#2 to 11 secs or less. Baseline: 14.75 Goal status: INITIAL  ASSESSMENT:  CLINICAL IMPRESSION: Pt. is progressing towards goals. she demonstrates understanding of coordiantion HEP issued today. she responds well to cueing for larger amplitude movement strategies with ADLs. PERFORMANCE DEFICITS: in functional skills including ADLs, IADLs, coordination, dexterity, tone, ROM, flexibility, Fine motor control, Gross motor control, mobility, balance, decreased knowledge of use of DME, and UE functional use, brafykinesia, rigidity, and psychosocial skills including coping strategies, environmental adaptation, habits, interpersonal interactions, and routines and behaviors.   IMPAIRMENTS: are limiting patient from ADLs, IADLs, rest and sleep, play, leisure, and social participation.   COMORBIDITIES:  may have co-morbidities  that affects occupational performance. Patient will benefit from skilled  OT to address above impairments and improve overall function.  MODIFICATION OR ASSISTANCE TO COMPLETE EVALUATION: No modification of tasks or assist necessary to complete an evaluation.  OT OCCUPATIONAL PROFILE AND HISTORY: Detailed assessment: Review of records and additional review of physical, cognitive, psychosocial history related to current functional performance.  CLINICAL DECISION MAKING: LOW - limited treatment options, no task modification necessary  REHAB POTENTIAL: Good  EVALUATION COMPLEXITY: Low    PLAN:  OT FREQUENCY: 2x/week  OT DURATION: 12 weeks (anticipate d/c after 8 weeks dependent on progress)  PLANNED INTERVENTIONS: 16109 OT Re-evaluation, 97535 self care/ADL training, 60454 therapeutic exercise, 97530 therapeutic activity, 97112 neuromuscular re-education, 97140 manual therapy, 97035 ultrasound, 97018 paraffin, 09811 moist heat, 97010 cryotherapy, 97034 contrast bath, passive range of motion, balance training, energy conservation, coping strategies training, patient/family education, and DME and/or AE instructions  RECOMMENDED OTHER SERVICES: PT  CONSULTED AND AGREED WITH PLAN OF CARE: Patient  PLAN FOR NEXT SESSION:  reinforce big movement strategies for ADLs/I ADLs issue handout   Ariba Lehnen, OT 08/17/2023, 9:06 AM

## 2023-08-16 NOTE — Therapy (Signed)
OUTPATIENT PHYSICAL THERAPY NEURO TREATMENT   Patient Name: Anita Schmidt MRN: [id number] DOB:1956-04-20, 68 y.o., female Today's Date: 08/16/2023   PCP: Tracey Harries, MD REFERRING PROVIDER: Levert Feinstein, MD   END OF SESSION:  PT End of Session - 08/16/23 1323     Visit Number 4    Date for PT Re-Evaluation 10/05/23    PT Start Time 1318    PT Stop Time 1358    PT Time Calculation (min) 40 min            Past Medical History:  Diagnosis Date   Anxiety    severe   Anxiety disorder    Basal cell carcinoma 01/12/2011   BCC LEFT SIDE OF NOSE TX=MOHS   Fibrocystic breast disease    GERD (gastroesophageal reflux disease)    triggered with anxiety-not treated with meds   Gilbert's syndrome    Heart murmur    slight murmur dx by MD- no treatment-EKG clear   Hemochromatosis    High blood pressure    on meds   Hyperlipidemia    diet controlled   Skin cancer, basal cell    nose   Past Surgical History:  Procedure Laterality Date   BREAST BIOPSY Bilateral    COLONOSCOPY     colonscopy  2011   Tics ; Dr Jarold Motto   DILATION AND CURETTAGE OF UTERUS     INGUINAL HERNIA REPAIR     MOHS SURGERY  2012   nose   TONSILLECTOMY     Tracer Pellets  2003   in breast; [facility]   WISDOM TOOTH EXTRACTION     Patient Active Problem List   Diagnosis Date Noted   Hyperreflexia 05/31/2023   Parkinsonism (HCC) 05/31/2023   Right leg weakness 01/07/2021   Weakness of right hip 01/07/2021   Affective disorder (HCC) 10/18/2019   Abnormal EKG 07/03/2018   Infraspinatus strain 04/20/2018   It band syndrome, left 04/20/2018   Cough 10/27/2017   Wheezing 10/27/2017   Anxiety 10/27/2017   Hyperglycemia 04/07/2017   Insect bite 04/07/2017   Cardiac murmur 12/29/2016   Pes anserine bursitis 10/19/2016   Scapular dyskinesis 10/19/2016   Preventative health care 03/11/2016   Generalized anxiety disorder 05/24/2013   Benign essential hypertension 05/23/2013   Diverticulosis of  colon without hemorrhage 04/27/2013   Malignant basal cell neoplasm of skin 04/27/2013   Hemochromatosis    Bee sting allergy 03/26/2011   IBS 02/10/2010   Anxiety state 12/25/2008   DISC DISEASE, CERVICAL 11/19/2008   Allergic rhinitis 09/19/2008   Hyperlipidemia 08/01/2008   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   Patient reports that she got back to her spinning class today. She still feels it is very weak  Pt accompanied by: self  PERTINENT HISTORY:  Per referring phaysican note: Airi Copado is a 68 y.o. 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 Yes: NPRS scale: patient did not state. Pain location: R shoulder Pain description: N/A Aggravating factors: certain motions Relieving factors: avoiding  the aggravating movements.  PRECAUTIONS: Fall  RED FLAGS: None   WEIGHT BEARING RESTRICTIONS: No  FALLS: Has patient fallen in last 6 months? No  LIVING ENVIRONMENT: Lives with: lives alone Lives in: House/apartment Stairs: Yes: Internal: 14 steps; on left going up and External: 4 steps; bilateral but cannot reach both Has following equipment at home: None  PLOF: Independent  PATIENT GOALS: Patient would like to identify any problems, Parkinson's education to minimize the impact on her daily life.  OBJECTIVE:  Note: Objective measures were completed at Evaluation unless otherwise noted.  DIAGNOSTIC FINDINGS:  Cerv CT 12/24 IMPRESSION: 1. No acute intracranial process. 2. No acute fracture or traumatic listhesis in the cervical spine. 3. Multilevel degenerative changes in the cervical spine, with mild-to-moderate spinal canal stenosis at C5-C6 and mild spinal canal stenosis at C6-C7. 4. Multilevel neural foraminal narrowing, severe on the right at C5-C6 and moderate bilaterally at C3-C4 and on the left at C5-C6. 5. 1.9 cm hypodense lesion in the right thyroid lobe, with areas of calcification. If this has not previously been evaluated, a non-emergent ultrasound of the thyroid is recommended. (Reference: J Am Coll Radiol. 2015 Feb;12(2): 143-50) 6. 7 mm ground-glass nodule in the right lung. Initial follow-up with CT at 6 months is recommended to confirm persistence. If persistent, repeat CT is recommended every 2 years until 5 years of stability has been established.  COGNITION: Overall cognitive status: Within functional limits for tasks assessed   SENSATION: Patient denies any change  COORDINATION: Heel on shin WNL  EDEMA:  swelling  MUSCLE TONE: no rigidity noted  MUSCLE LENGTH: Hamstrings: Right 85 deg; Left 75 deg Thomas test: Mildly tight  POSTURE: rounded shoulders and R scapula winging, patient reports spinal curvature  LOWER EXTREMITY ROM:  WNL B     LOWER EXTREMITY MMT:  5/5 BLE  BED MOBILITY:  I  TRANSFERS: I RAMP:  I  CURB:  I  STAIRS: Level of Assistance: Modified independence Stair Negotiation Technique: Step to Pattern with Single Rail on Left Number of Stairs: 14  Comments: Patient reports that she does fine on steps as long as she uses a rail.  GAIT: Gait pattern: step through pattern, decreased arm swing- Right, decreased arm swing- Left, decreased step length- Right, and decreased step length- Left Distance walked: In clinic distances Assistive device utilized: None Level of assistance: Complete Independence Comments: Patient reports  that she tries to focus on longer steps.  FUNCTIONAL TESTS:  5 times sit to stand: 11.69 Timed up and go (TUG): 8.24, cog 8.84, dual 9.37 Functional gait assessment: 24/30                                                                                                                             TREATMENT DATE:  08/16/23 Bike L4 x 4 min warm up AR with 5#, 1 x 10 reps each side Ball on wall, UUE, 5x each direction, x 2 Standing on upside down BOSU, static, lateral weight shifts, ant/post weight shifts, clock face. B side step onto and off airex pad with wide steps, x 10 each direction. B side step on airex plank x 5 each way. SLS-forward "I" with arms overhead, x 10 on each leg B side step against G tband 5 x 8 steps in each direction, band started at knees, but moved to mid shin to increase resistance.  08/10/23:  Nustep level 5, 5 min 30 sec, LE's only, did not like the UE movement, too abrupt Reviewed and practiced the PWR moves that were provided last session.  She performed well.  We discussed performing in front of mirror to monitor her position particularly for R arm Supine for manual stretching for B hamstrings 60 sec holds, once each leg Prone for R shoulder horizontal abd palm towards floor for engaging posterior shoulder musculature Practiced gait stepping over  different height boxes, initially she tended to sling her leg around the box but corrected easily with instruction.  Single leg stance, able to stand for over 30 sec each foot.  08/09/23 Bike L3 x 5 minutes M-CTSIB screen- Completed 1 x 30 sec at each level, S, mildly increased sway with eyes closed on level ground and on airex, but no LOB PWR moves- standing, x 10 reps each SLS pivots to touch mat and return to start at approximately 24", then 20", slightly more difficulty in L stance Heel raises on step, B, x 20 reps, with UE support  07/27/23 Education  PATIENT EDUCATION: Education details: POC Person educated: Patient Education method: Explanation Education comprehension: verbalized understanding  HOME EXERCISE PROGRAM: Standing PWR moves 08/09/23  GOALS: Goals reviewed with patient? Yes  SHORT TERM GOALS: Target date: 08/16/23  I with initial HEP Baseline: Goal status: 08/15/22-Provided patient with written PWR move instructions for standing activities, met  LONG TERM GOALS: Target date: 10/05/23  I with final HEP Baseline:  Goal status: INITIAL  2.  Patient will score 28/30 on FGA to demonstrate decreased fall risk Baseline:  Goal status: INITIAL  3.  Patient will achieve 100% on M-CTSIB Baseline:  Goal status: 08/09/23-passed screen, met  4.  Patient will demonstrate normalized gait pattern while ambulating at least 800' on level and unlevel surfaces including normalized step length, no shuffling or festination. I gait Baseline:  Goal status: INITIAL  5.  Patient will demonstrate the ability to climb  up or down 12 steps going step over step, no UE support Baseline:  Goal status: INITIAL  6.  Patient will identify appropriate activities and the appropriate balance of activities to address her Parkinson's with living her daily life.  Baseline: Currently reports that her treatment activities are a bit overwhelming. Goal status: INITIAL  ASSESSMENT:  CLINICAL  IMPRESSION: Patient reports that she has started back to yoga and her cycling class. Her RUE strength remains her biggest challenge. Initiated some scapular stabilization exercises, then deferred additional UE treatment to OT, whom she saw after PT. Moved to balance, big movements, and strength for BLE and trunk. Patient able to perform fairly high level challenges. She consistently verbalizes fear that she is not doing enough and fear of the future. RE-assured her that her current activity level is very appropriate for her needs. Encouraged her to be more kind to herself and acknowledge her activities.  OBJECTIVE IMPAIRMENTS: Abnormal gait, decreased activity tolerance, decreased balance, decreased coordination, decreased endurance, difficulty walking, decreased ROM, decreased strength, impaired flexibility, and impaired tone.   ACTIVITY LIMITATIONS: carrying, lifting, bending, standing, squatting, stairs, transfers, and locomotion level  PARTICIPATION LIMITATIONS: meal prep, cleaning, laundry, driving, shopping, and community activity  PERSONAL FACTORS: Past/current experiences are also affecting patient's functional outcome.   REHAB POTENTIAL: Good  CLINICAL DECISION MAKING: Evolving/moderate complexity  EVALUATION COMPLEXITY: Moderate  PLAN:  PT FREQUENCY: 2x/week  PT DURATION: 10 weeks  PLANNED INTERVENTIONS: 97110-Therapeutic exercises, 97530- Therapeutic activity, 97112- Neuromuscular re-education, 97535- Self Care, 16109- Manual therapy, [phone]- Gait training, Patient/Family education, Balance training, Dry Needling, Joint mobilization, Spinal mobilization, Cryotherapy, and Moist heat  PLAN FOR NEXT SESSION: continue to progress with large amplitude movements, emphasis on slowed and controlled moves.  Amy Arta Silence, PT, DP 08/16/2023, 2:07 PM

## 2023-08-16 NOTE — Patient Instructions (Signed)
Coordination Exercises  Perform the following exercises for 20 minutes 1 times per day. Perform with both hand(s). Perform using big movements.  Flipping Cards: Place deck of cards on the table. Flip cards over by opening your hand big to grasp and then turn your palm up big. Deal cards: Hold 1/2 or whole deck in your hand. Use thumb to push card off top of deck with one big push. Rotate ball with fingertips: Pick up with fingers/thumb and move as much as you can with each turn/movement (clockwise and counter-clockwise). Toss ball from one hand to the other: Toss big/high. Toss ball in the air and catch with the same hand: Toss big/high. Pick up coins and place in coin bank or container: Pick up with big, intentional movements. Do not drag coin to the edge. Pick up coins and stack one at a time: Pick up with big, intentional movements. Do not drag coin to the edge. (5-10 in a stack) Pick up 5-10 coins one at a time and hold in palm. Then, move coins from palm to fingertips one at time and place in coin bank/container. Pick up 5-10 coins one at a time and hold in palm. Then, move coins from palm to fingertips one at a time to stack. Practice writing: Slow down, write big, and focus on forming each letter. Perform "Flicks"/hand stretches (PWR! Hands): Close hands then flick out your fingers with focus on opening hands, pulling wrists back, and extending elbows like you are pushing. Pick up various small objects that are different shapes/sizes and place in container. (paperclips, buttons, keys, dried beans/pasta, coins, screws, nuts/bolts, washers, board game pieces, etc.)

## 2023-08-18 ENCOUNTER — Encounter: Payer: Self-pay | Admitting: Physical Therapy

## 2023-08-18 ENCOUNTER — Ambulatory Visit: Payer: Medicare HMO | Admitting: Physical Therapy

## 2023-08-18 ENCOUNTER — Ambulatory Visit: Payer: Medicare HMO | Admitting: Occupational Therapy

## 2023-08-18 DIAGNOSIS — R29818 Other symptoms and signs involving the nervous system: Secondary | ICD-10-CM | POA: Diagnosis not present

## 2023-08-18 DIAGNOSIS — R2681 Unsteadiness on feet: Secondary | ICD-10-CM

## 2023-08-18 DIAGNOSIS — R278 Other lack of coordination: Secondary | ICD-10-CM

## 2023-08-18 DIAGNOSIS — R2689 Other abnormalities of gait and mobility: Secondary | ICD-10-CM

## 2023-08-18 DIAGNOSIS — M25621 Stiffness of right elbow, not elsewhere classified: Secondary | ICD-10-CM

## 2023-08-18 DIAGNOSIS — G20A1 Parkinson's disease without dyskinesia, without mention of fluctuations: Secondary | ICD-10-CM

## 2023-08-18 NOTE — Therapy (Signed)
OUTPATIENT OCCUPATIONAL THERAPY PARKINSON'S treatment  Patient Name: Anita Schmidt MRN: [id number] DOB:01-23-1956, 68 y.o., female Today's Date: 08/18/2023  PCP: Dr. Everlene Other REFERRING PROVIDER: Dr. Terrace Arabia  END OF SESSION:  OT End of Session - 08/18/23 1503     Visit Number 4    Number of Visits 25    Authorization Type [employer]    Authorization Time Period 12 weeks    Progress Note Due on Visit 10    OT Start Time 1319    OT Stop Time 1403    OT Time Calculation (min) 44 min               Past Medical History:  Diagnosis Date   Anxiety    severe   Anxiety disorder    Basal cell carcinoma 01/12/2011   BCC LEFT SIDE OF NOSE TX=MOHS   Fibrocystic breast disease    GERD (gastroesophageal reflux disease)    triggered with anxiety-not treated with meds   Gilbert's syndrome    Heart murmur    slight murmur dx by MD- no treatment-EKG clear   Hemochromatosis    High blood pressure    on meds   Hyperlipidemia    diet controlled   Skin cancer, basal cell    nose   Past Surgical History:  Procedure Laterality Date   BREAST BIOPSY Bilateral    COLONOSCOPY     colonscopy  2011   Tics ; Dr Jarold Motto   DILATION AND CURETTAGE OF UTERUS     INGUINAL HERNIA REPAIR     MOHS SURGERY  2012   nose   TONSILLECTOMY     Tracer Pellets  2003   in breast; [facility]   WISDOM TOOTH EXTRACTION     Patient Active Problem List   Diagnosis Date Noted   Hyperreflexia 05/31/2023   Parkinsonism (HCC) 05/31/2023   Right leg weakness 01/07/2021   Weakness of right hip 01/07/2021   Affective disorder (HCC) 10/18/2019   Abnormal EKG 07/03/2018   Infraspinatus strain 04/20/2018   It band syndrome, left 04/20/2018   Cough 10/27/2017   Wheezing 10/27/2017   Anxiety 10/27/2017   Hyperglycemia 04/07/2017   Insect bite 04/07/2017   Cardiac murmur 12/29/2016   Pes anserine bursitis 10/19/2016   Scapular dyskinesis 10/19/2016   Preventative health care 03/11/2016   Generalized  anxiety disorder 05/24/2013   Benign essential hypertension 05/23/2013   Diverticulosis of colon without hemorrhage 04/27/2013   Malignant basal cell neoplasm of skin 04/27/2013   Hemochromatosis    Bee sting allergy 03/26/2011   IBS 02/10/2010   Anxiety state 12/25/2008   DISC DISEASE, CERVICAL 11/19/2008   Allergic rhinitis 09/19/2008   Hyperlipidemia 08/01/2008   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 hands Pt accompanied by: self  PERTINENT HISTORY:  Parkinsonism, recently diagnosed  November 2024, however symptomatic with decreased arm swing for several years. PMHx of HTN, ostoporosis, anxiety    PRECAUTIONS: Fall  WEIGHT BEARING RESTRICTIONS: No  PAIN:  Are you having pain? No  FALLS: Has patient fallen in last 6 months? No  LIVING ENVIRONMENT: Lives  with: lives alone Lives in: House/apartment Stairs: Yes: Internal  PLOF: Independent  PATIENT GOALS: maintain independence  OBJECTIVE:  Note: Objective measures were completed at Evaluation unless otherwise noted.  HAND DOMINANCE: Right  ADLs: Overall ADLs: mod I with all basic ADLs. Transfers/ambulation related to ADLs: Eating: mod I Grooming: mod I UB Dressing: mod I LB Dressing: mod I Toileting: mod  Bathing: mod I Tub Shower transfers: mod I   IADLs:Pt reports she is mod I with all home management S Handwriting: 100% legible, however pt reports micrographia as she writes  for longer time period.  MOBILITY STATUS: Independent     FUNCTIONAL OUTCOME  MEASURES: Fastening/unfastening 3 buttons: 41.40 Physical performance test: PPT#2 (simulated eating) 14.75 secs  & PPT#4 (donning/doffing jacket): 22.42 secs  COORDINATION: 9 Hole Peg test: Right: 30.76 sec; Left: 32.94 sec Box and Blocks:  Right 36 blocks, Left 50 blocks  UE ROM:   shoulder flexion RUE 140, LUE 140, Elbow extension RUE -10, LUE -5    SENSATION: WFL  MUSCLE TONE: RUE: Rigidity  COGNITION: Overall cognitive status: Within functional limits for tasks assessed  OBSERVATIONS: Bradykinesia                                                                                                                    TREATMENT DATE:08/18/23- see pt. education section,  Bag exercises for simulated ADLS: donning socks, donning shirt, tucking in shirt, min-mod v.c for amplitude and RUE functional use.  08/16/23- Reviewed PWR! hands basic 4, 5- 10 reps each, min v.c and demonstration. Pt was instructed in coordination HEP, min-mod v.c for amplitude. Beginning discussion regarding big movements with ADL tasks. Pt practiced opening and closing containers with larger amplitude movement strategies.   08/10/23- see pt. education Handwriting actiives with continuous"l" and education regarding positioning  07/27/23- eval     PATIENT EDUCATION: Education details: 08/18/23 PWR! hands basic 4,Education regarding big movments with ADLS, handout provided, practice of cutting food, washing hair, tucking in shirt, washing back and hands, with large amplitude movements. Person educated: Patient Education method: Explanation, demonstration, handouts, v.c  Education comprehension: verbalized understanding, returned demonstration  HOME EXERCISE PROGRAM: PWR hands, PWR! basic 4 modified quadraped-08/10/23 handwriting with PD- 08/10/23 coordination 08/16/23 ADL strategies with big movments-08/18/23 GOALS: Goals reviewed with patient? Yes  SHORT TERM GOALS: Target date: 08/27/23  I with PD specific  HEP  Goal status:  ongoing  2. Pt will demonstrate increased ease with dressing as evidenced by decreasing PPT#4(don/ doff jacket) to 19 secs or less Baseline: 22.42 sec Goal status:  ongoing  3.  Pt will write a short paragraph with 100% legibility and no significant decrease in letter size Baseline, pt reports micrographia when writing a paragraph Goal status: ongpoing  4.  Pt will demonstrate improved RUE functional use as evidenced by increasing box/ blocks score to 40 blocks or greater,  Goal status: ongoing  5.  I with adapted strategies for ADLS to maximize pt safety and I.   Goal  status: ongoing   LONG TERM GOALS: Target date: 10/19/23  Pt will demonstrate improved ease with fastening buttons as evidenced by decreasing 3 button/ unbutton time to :38 secs or less  Baseline: 41.40 Goal status: INITIAL  2.   Pt will verbalize understanding of ways to prevent future PD related complications and PD community resources.        Goal status: INITIAL  3.  Pt will demonstrate improved ease with feeding as evidenced by decreasing PPT#2 to 11 secs or less. Baseline: 14.75 Goal status: INITIAL  ASSESSMENT:  CLINICAL IMPRESSION: Pt. is progressing towards goals. She is demonstrating a better understanding of how big movement strategies can help with daily tasks.Pt was encouraged to continue to do the things she wants to do and not to get weighed down by negative thoughts about PD.PERFORMANCE DEFICITS: in functional skills including ADLs, IADLs, coordination, dexterity, tone, ROM, flexibility, Fine motor control, Gross motor control, mobility, balance, decreased knowledge of use of DME, and UE functional use, brafykinesia, rigidity, and psychosocial skills including coping strategies, environmental adaptation, habits, interpersonal interactions, and routines and behaviors.   IMPAIRMENTS: are limiting patient from ADLs, IADLs, rest and sleep, play, leisure, and social participation.    COMORBIDITIES:  may have co-morbidities  that affects occupational performance. Patient will benefit from skilled OT to address above impairments and improve overall function.  MODIFICATION OR ASSISTANCE TO COMPLETE EVALUATION: No modification of tasks or assist necessary to complete an evaluation.  OT OCCUPATIONAL PROFILE AND HISTORY: Detailed assessment: Review of records and additional review of physical, cognitive, psychosocial history related to current functional performance.  CLINICAL DECISION MAKING: LOW - limited treatment options, no task modification necessary  REHAB POTENTIAL: Good  EVALUATION COMPLEXITY: Low    PLAN:  OT FREQUENCY: 2x/week  OT DURATION: 12 weeks (anticipate d/c after 8 weeks dependent on progress)  PLANNED INTERVENTIONS: 04540 OT Re-evaluation, 97535 self care/ADL training, 98119 therapeutic exercise, 97530 therapeutic activity, 97112 neuromuscular re-education, 97140 manual therapy, 97035 ultrasound, 97018 paraffin, 14782 moist heat, 97010 cryotherapy, 97034 contrast bath, passive range of motion, balance training, energy conservation, coping strategies training, patient/family education, and DME and/or AE instructions  RECOMMENDED OTHER SERVICES: PT  CONSULTED AND AGREED WITH PLAN OF CARE: Patient  PLAN FOR NEXT SESSION:  reinforce big movement strategies for ADLs/I ADLs , PWR! moves supine?   Laporchia Nakajima, OT 08/18/2023, 3:05 PM

## 2023-08-18 NOTE — Therapy (Signed)
OUTPATIENT PHYSICAL THERAPY NEURO TREATMENT   Patient Name: Anita Schmidt MRN: [id number] DOB:1955-12-03, 68 y.o., female Today's Date: 08/18/2023   PCP: Tracey Harries, MD REFERRING PROVIDER: Levert Feinstein, MD   END OF SESSION:  PT End of Session - 08/18/23 1536     Visit Number 5    Date for PT Re-Evaluation 10/05/23    PT Start Time 1407    PT Stop Time 1445    PT Time Calculation (min) 38 min    Activity Tolerance Patient tolerated treatment well    Behavior During Therapy WFL for tasks assessed/performed             Past Medical History:  Diagnosis Date   Anxiety    severe   Anxiety disorder    Basal cell carcinoma 01/12/2011   BCC LEFT SIDE OF NOSE TX=MOHS   Fibrocystic breast disease    GERD (gastroesophageal reflux disease)    triggered with anxiety-not treated with meds   Gilbert's syndrome    Heart murmur    slight murmur dx by MD- no treatment-EKG clear   Hemochromatosis    High blood pressure    on meds   Hyperlipidemia    diet controlled   Skin cancer, basal cell    nose   Past Surgical History:  Procedure Laterality Date   BREAST BIOPSY Bilateral    COLONOSCOPY     colonscopy  2011   Tics ; Dr Jarold Motto   DILATION AND CURETTAGE OF UTERUS     INGUINAL HERNIA REPAIR     MOHS SURGERY  2012   nose   TONSILLECTOMY     Tracer Pellets  2003   in breast; [facility]   WISDOM TOOTH EXTRACTION     Patient Active Problem List   Diagnosis Date Noted   Hyperreflexia 05/31/2023   Parkinsonism (HCC) 05/31/2023   Right leg weakness 01/07/2021   Weakness of right hip 01/07/2021   Affective disorder (HCC) 10/18/2019   Abnormal EKG 07/03/2018   Infraspinatus strain 04/20/2018   It band syndrome, left 04/20/2018   Cough 10/27/2017   Wheezing 10/27/2017   Anxiety 10/27/2017   Hyperglycemia 04/07/2017   Insect bite 04/07/2017   Cardiac murmur 12/29/2016   Pes anserine bursitis 10/19/2016   Scapular dyskinesis 10/19/2016   Preventative health care  03/11/2016   Generalized anxiety disorder 05/24/2013   Benign essential hypertension 05/23/2013   Diverticulosis of colon without hemorrhage 04/27/2013   Malignant basal cell neoplasm of skin 04/27/2013   Hemochromatosis    Bee sting allergy 03/26/2011   IBS 02/10/2010   Anxiety state 12/25/2008   DISC DISEASE, CERVICAL 11/19/2008   Allergic rhinitis 09/19/2008   Hyperlipidemia 08/01/2008   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  Pt accompanied by: self  PERTINENT HISTORY:  Per referring phaysican note: Lamica Mccart is a 68 y.o. 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 Yes: NPRS scale: patient did not state. Pain location: R shoulder Pain description: N/A Aggravating factors: certain motions Relieving factors:  avoiding the aggravating movements.  PRECAUTIONS: Fall  RED FLAGS: None   WEIGHT BEARING RESTRICTIONS: No  FALLS: Has patient fallen in last 6 months? No  LIVING ENVIRONMENT: Lives with: lives alone Lives in: House/apartment Stairs: Yes: Internal: 14 steps; on left going up and External: 4 steps; bilateral but cannot reach both Has following equipment at home: None  PLOF: Independent  PATIENT GOALS: Patient would like to identify any problems, Parkinson's education to minimize the impact on her daily life.  OBJECTIVE:  Note: Objective measures were completed at Evaluation unless otherwise noted.  DIAGNOSTIC FINDINGS:  Cerv CT 12/24 IMPRESSION: 1. No acute intracranial process. 2. No acute fracture or traumatic listhesis in the cervical spine. 3. Multilevel degenerative changes in the cervical spine, with mild-to-moderate spinal canal stenosis at C5-C6 and mild spinal canal stenosis at C6-C7. 4. Multilevel neural foraminal narrowing, severe on the right at C5-C6 and moderate bilaterally at C3-C4 and on the left at C5-C6. 5. 1.9 cm hypodense lesion in the right thyroid lobe, with areas of calcification. If this has not previously been evaluated, a non-emergent ultrasound of the thyroid is recommended. (Reference: J Am Coll Radiol. 2015 Feb;12(2): 143-50) 6. 7 mm ground-glass nodule in the right lung. Initial follow-up with CT at 6 months is recommended to confirm persistence. If persistent, repeat CT is recommended every 2 years until 5 years of stability has been established.  COGNITION: Overall cognitive status: Within functional limits for tasks assessed   SENSATION: Patient denies any change  COORDINATION: Heel on shin WNL  EDEMA:  swelling  MUSCLE TONE: no rigidity noted  MUSCLE LENGTH: Hamstrings: Right 85 deg; Left 75 deg Thomas test: Mildly tight  POSTURE: rounded shoulders and R scapula winging, patient reports spinal curvature  LOWER EXTREMITY  ROM:  WNL B    LOWER EXTREMITY MMT:  5/5 BLE  BED MOBILITY:  I  TRANSFERS: I RAMP:  I  CURB:  I  STAIRS: Level of Assistance: Modified independence Stair Negotiation Technique: Step to Pattern with Single Rail on Left Number of Stairs: 14  Comments: Patient reports that she does fine on steps as long as she uses a rail.  GAIT: Gait pattern: step through pattern, decreased arm swing- Right, decreased arm swing- Left, decreased step length- Right, and decreased step length- Left Distance walked: In clinic distances Assistive device utilized: None Level of assistance: Complete Independence Comments: Patient reports that she tries to focus on longer steps.  FUNCTIONAL TESTS:  5 times sit to stand: 11.69 Timed up and go (TUG): 8.24, cog 8.84,  dual 9.37 Functional gait assessment: 24/30                                                                                                                             TREATMENT DATE:  08/18/23 Ambulated outdoors on unlevel surfaces, up and down curbs, fast walking speed, up and down inclines, no unsteadiness noted. Alternating cone taps while standing on airex pad, progressed to multiple taps, then taps with rotation.  1 foot on stool, rolling stool side to side with rotation in SLS. 4 square stepping, first clockwise, then counter clockwise, then changing directions unexpectedly. She got caught leaning the wrong way multiple times, but was able to adjust with no LOB. Heel rise, toes raise on airex plank, able to shift through full range.  08/16/23 Bike L4 x 4 min warm up AR with 5#, 1 x 10 reps each side Ball on wall, UUE, 5x each direction, x 2 Standing on upside down BOSU, static, lateral weight shifts, ant/post weight shifts, clock face. B side step onto and off airex pad with wide steps, x 10 each direction. B side step on airex plank x 5 each way. SLS-forward "I" with arms overhead, x 10 on each leg B side step against G tband 5  x 8 steps in each direction, band started at knees, but moved to mid shin to increase resistance.  08/10/23:  Nustep level 5, 5 min 30 sec, LE's only, did not like the UE movement, too abrupt Reviewed and practiced the PWR moves that were provided last session.  She performed well.  We discussed performing in front of mirror to monitor her position particularly for R arm Supine for manual stretching for B hamstrings 60 sec holds, once each leg Prone for R shoulder horizontal abd palm towards floor for engaging posterior shoulder musculature Practiced gait stepping over different height boxes, initially she tended to sling her leg around the box but corrected easily with instruction.  Single leg stance, able to stand for over 30 sec each foot.  08/09/23 Bike L3 x 5 minutes M-CTSIB screen- Completed 1 x 30 sec at each level, S, mildly increased sway with eyes closed on level ground and on airex, but no LOB PWR moves- standing, x 10 reps each SLS pivots to touch mat and return to start at approximately 24", then 20", slightly more difficulty in L stance Heel raises on step, B, x 20 reps, with UE support  07/27/23 Education  PATIENT EDUCATION: Education details: POC Person educated: Patient Education method: Explanation Education comprehension: verbalized understanding  HOME EXERCISE PROGRAM: Standing PWR moves 08/09/23  GOALS: Goals reviewed with patient? Yes  SHORT TERM GOALS: Target date: 08/16/23  I with initial HEP Baseline: Goal status: 08/15/22-Provided patient with written PWR move instructions for standing activities, met  LONG TERM GOALS: Target date: 10/05/23  I with final HEP Baseline:  Goal status: INITIAL  2.  Patient will score 28/30 on FGA to demonstrate decreased  fall risk Baseline:  Goal status: INITIAL  3.  Patient will achieve 100% on M-CTSIB Baseline:  Goal status: 08/09/23-passed screen, met  4.  Patient will demonstrate normalized gait pattern while  ambulating at least 800' on level and unlevel surfaces including normalized step length, no shuffling or festination. I gait Baseline:  Goal status: INITIAL  5.  Patient will demonstrate the ability to climb up or down 12 steps going step over step, no UE support Baseline:  Goal status: INITIAL  6.  Patient will identify appropriate activities and the appropriate balance of activities to address her Parkinson's with living her daily life.  Baseline: Currently reports that her treatment activities are a bit overwhelming. Goal status: INITIAL  ASSESSMENT:  CLINICAL IMPRESSION: Patient continues to improve in her balance and speed of movement. She seems to be aware of her activity level and is improving in her understanding of her strengths. Trying to decrease the level of stress she feels from the Parkinson's to move and just find athletic activities she enjoys. Treatment focused on high level blaance training including SLS and speed of movement.  OBJECTIVE IMPAIRMENTS: Abnormal gait, decreased activity tolerance, decreased balance, decreased coordination, decreased endurance, difficulty walking, decreased ROM, decreased strength, impaired flexibility, and impaired tone.   ACTIVITY LIMITATIONS: carrying, lifting, bending, standing, squatting, stairs, transfers, and locomotion level  PARTICIPATION LIMITATIONS: meal prep, cleaning, laundry, driving, shopping, and community activity  PERSONAL FACTORS: Past/current experiences are also affecting patient's functional outcome.   REHAB POTENTIAL: Good  CLINICAL DECISION MAKING: Evolving/moderate complexity  EVALUATION COMPLEXITY: Moderate  PLAN:  PT FREQUENCY: 2x/week  PT DURATION: 10 weeks  PLANNED INTERVENTIONS: 97110-Therapeutic exercises, 97530- Therapeutic activity, 97112- Neuromuscular re-education, 97535- Self Care, 16109- Manual therapy, [phone]- Gait training, Patient/Family education, Balance training, Dry Needling, Joint  mobilization, Spinal mobilization, Cryotherapy, and Moist heat  PLAN FOR NEXT SESSION: continue to progress with large amplitude movements, emphasis on slowed and controlled moves.  Oley Balm DPT 08/18/23 3:38 PM  08/18/2023, 3:38 PM

## 2023-08-23 ENCOUNTER — Ambulatory Visit: Payer: Medicare HMO | Admitting: Physical Therapy

## 2023-08-23 ENCOUNTER — Encounter: Payer: Self-pay | Admitting: Physical Therapy

## 2023-08-23 ENCOUNTER — Ambulatory Visit: Payer: Medicare HMO

## 2023-08-23 ENCOUNTER — Ambulatory Visit: Payer: Medicare HMO | Attending: Neurology | Admitting: Occupational Therapy

## 2023-08-23 DIAGNOSIS — R278 Other lack of coordination: Secondary | ICD-10-CM | POA: Diagnosis present

## 2023-08-23 DIAGNOSIS — R29818 Other symptoms and signs involving the nervous system: Secondary | ICD-10-CM

## 2023-08-23 DIAGNOSIS — G20A1 Parkinson's disease without dyskinesia, without mention of fluctuations: Secondary | ICD-10-CM

## 2023-08-23 DIAGNOSIS — R2689 Other abnormalities of gait and mobility: Secondary | ICD-10-CM | POA: Insufficient documentation

## 2023-08-23 DIAGNOSIS — M25621 Stiffness of right elbow, not elsewhere classified: Secondary | ICD-10-CM | POA: Diagnosis present

## 2023-08-23 DIAGNOSIS — R262 Difficulty in walking, not elsewhere classified: Secondary | ICD-10-CM | POA: Insufficient documentation

## 2023-08-23 DIAGNOSIS — R2681 Unsteadiness on feet: Secondary | ICD-10-CM | POA: Insufficient documentation

## 2023-08-23 NOTE — Therapy (Signed)
 OUTPATIENT PHYSICAL THERAPY NEURO TREATMENT   Patient Name: Anita Schmidt MRN: [id number] DOB:Aug 30, 1955, 68 y.o., female Today's Date: 08/23/2023   PCP: Anita Lenis, MD REFERRING PROVIDER: Onita Duos, MD   END OF SESSION:  PT End of Session - 08/23/23 1457     Visit Number 6    Date for PT Re-Evaluation 10/05/23    PT Start Time 1445    PT Stop Time 1530    PT Time Calculation (min) 45 min    Activity Tolerance Patient tolerated treatment well    Behavior During Therapy WFL for tasks assessed/performed             Past Medical History:  Diagnosis Date   Anxiety    severe   Anxiety disorder    Basal cell carcinoma 01/12/2011   BCC LEFT SIDE OF NOSE TX=MOHS   Fibrocystic breast disease    GERD (gastroesophageal reflux disease)    triggered with anxiety-not treated with meds   Gilbert's syndrome    Heart murmur    slight murmur dx by MD- no treatment-EKG clear   Hemochromatosis    High blood pressure    on meds   Hyperlipidemia    diet controlled   Skin cancer, basal cell    nose   Past Surgical History:  Procedure Laterality Date   BREAST BIOPSY Bilateral    COLONOSCOPY     colonscopy  2011   Tics ; Dr Jakie   DILATION AND CURETTAGE OF UTERUS     INGUINAL HERNIA REPAIR     MOHS SURGERY  2012   nose   TONSILLECTOMY     Tracer Pellets  2003   in breast; [facility]   WISDOM TOOTH EXTRACTION     Patient Active Problem List   Diagnosis Date Noted   Hyperreflexia 05/31/2023   Parkinsonism (HCC) 05/31/2023   Right leg weakness 01/07/2021   Weakness of right hip 01/07/2021   Affective disorder (HCC) 10/18/2019   Abnormal EKG 07/03/2018   Infraspinatus strain 04/20/2018   It band syndrome, left 04/20/2018   Cough 10/27/2017   Wheezing 10/27/2017   Anxiety 10/27/2017   Hyperglycemia 04/07/2017   Insect bite 04/07/2017   Cardiac murmur 12/29/2016   Pes anserine bursitis 10/19/2016   Scapular dyskinesis 10/19/2016   Preventative health care  03/11/2016   Generalized anxiety disorder 05/24/2013   Benign essential hypertension 05/23/2013   Diverticulosis of colon without hemorrhage 04/27/2013   Malignant basal cell neoplasm of skin 04/27/2013   Hemochromatosis    Bee sting allergy 03/26/2011   IBS 02/10/2010   Anxiety state 12/25/2008   DISC DISEASE, CERVICAL 11/19/2008   Allergic rhinitis 09/19/2008   Hyperlipidemia 08/01/2008   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  Pt accompanied by: self  PERTINENT HISTORY:  Per referring phaysican note: Anita Schmidt is a 68 y.o. 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 Yes: NPRS scale: patient did not state. Pain location: R shoulder Pain description: N/A Aggravating factors: certain  motions Relieving factors: avoiding the aggravating movements.  PRECAUTIONS: Fall  RED FLAGS: None   WEIGHT BEARING RESTRICTIONS: No  FALLS: Has patient fallen in last 6 months? No  LIVING ENVIRONMENT: Lives with: lives alone Lives in: House/apartment Stairs: Yes: Internal: 14 steps; on left going up and External: 4 steps; bilateral but cannot reach both Has following equipment at home: None  PLOF: Independent  PATIENT GOALS: Patient would like to identify any problems, Parkinson's education to minimize the impact on her daily life.  OBJECTIVE:  Note: Objective measures were completed at Evaluation unless otherwise noted.  DIAGNOSTIC FINDINGS:  Cerv CT 12/24 IMPRESSION: 1. No acute intracranial process. 2. No acute fracture or traumatic listhesis in the cervical spine. 3. Multilevel degenerative changes in the cervical spine, with mild-to-moderate spinal canal stenosis at C5-C6 and mild spinal canal stenosis at C6-C7. 4. Multilevel neural foraminal narrowing, severe on the right at C5-C6 and moderate bilaterally at C3-C4 and on the left at C5-C6. 5. 1.9 cm hypodense lesion in the right thyroid  lobe, with areas of calcification. If this has not previously been evaluated, a non-emergent ultrasound of the thyroid  is recommended. (Reference: J Am Coll Radiol. 2015 Feb;12(2): 143-50) 6. 7 mm ground-glass nodule in the right lung. Initial follow-up with CT at 6 months is recommended to confirm persistence. If persistent, repeat CT is recommended every 2 years until 5 years of stability has been established.  COGNITION: Overall cognitive status: Within functional limits for tasks assessed   SENSATION: Patient denies any change  COORDINATION: Heel on shin WNL  EDEMA:  swelling  MUSCLE TONE: no rigidity noted  MUSCLE LENGTH: Hamstrings: Right 85 deg; Left 75 deg Thomas test: Mildly tight  POSTURE: rounded shoulders and R scapula winging, patient reports spinal  curvature  LOWER EXTREMITY ROM:  WNL B    LOWER EXTREMITY MMT:  5/5 BLE  BED MOBILITY:  I  TRANSFERS: I RAMP:  I  CURB:  I  STAIRS: Level of Assistance: Modified independence Stair Negotiation Technique: Step to Pattern with Single Rail on Left Number of Stairs: 14  Comments: Patient reports that she does fine on steps as long as she uses a rail.  GAIT: Gait pattern: step through pattern, decreased arm swing- Right, decreased arm swing- Left, decreased step length- Right, and decreased step length- Left Distance walked: In clinic distances Assistive device utilized: None Level of assistance: Complete Independence Comments: Patient reports that she tries to focus on longer steps.  FUNCTIONAL TESTS:  5 times sit to stand: 11.69 Timed up and go (TUG): 8.24, cog 8.84,  dual 9.37 Functional gait assessment: 24/30                                                                                                                             TREATMENT DATE:  08/23/23 Ambulated brisk pace around the back building x 2 some cues for right arm swing Back building stairs 2 full flights step over step On bosu two ways ball toss, volley ball On wobble board balance On bosu 2 ways reaching for numbers on wall with some math Skipping, power skipping LE stretches  08/18/23 Ambulated outdoors on unlevel surfaces, up and down curbs, fast walking speed, up and down inclines, no unsteadiness noted. Alternating cone taps while standing on airex pad, progressed to multiple taps, then taps with rotation.  1 foot on stool, rolling stool side to side with rotation in SLS. 4 square stepping, first clockwise, then counter clockwise, then changing directions unexpectedly. She got caught leaning the wrong way multiple times, but was able to adjust with no LOB. Heel rise, toes raise on airex plank, able to shift through full range.  08/16/23 Bike L4 x 4 min warm up AR with 5#, 1 x 10 reps each  side Ball on wall, UUE, 5x each direction, x 2 Standing on upside down BOSU, static, lateral weight shifts, ant/post weight shifts, clock face. B side step onto and off airex pad with wide steps, x 10 each direction. B side step on airex plank x 5 each way. SLS-forward I with arms overhead, x 10 on each leg B side step against G tband 5 x 8 steps in each direction, band started at knees, but moved to mid shin to increase resistance.  08/10/23:  Nustep level 5, 5 min 30 sec, LE's only, did not like the UE movement, too abrupt Reviewed and practiced the PWR moves that were provided last session.  She performed well.  We discussed performing in front of mirror to monitor her position particularly for R arm Supine for manual stretching for B hamstrings 60 sec holds, once each leg Prone for R shoulder horizontal abd palm towards floor for engaging posterior shoulder musculature Practiced gait stepping over different height boxes, initially she tended to sling her leg around the box but corrected easily with instruction.  Single leg stance, able to stand for over 30 sec each foot.  08/09/23 Bike L3 x 5 minutes M-CTSIB screen- Completed 1 x 30 sec at each level, S, mildly increased sway with eyes closed on level ground and on airex, but no LOB PWR moves- standing, x 10 reps each SLS pivots to touch mat and return to start at approximately 24, then 20, slightly more difficulty in L stance Heel raises on step, B, x 20 reps, with UE support  07/27/23 Education  PATIENT EDUCATION: Education details: POC Person educated: Patient Education method: Explanation Education comprehension: verbalized understanding  HOME EXERCISE PROGRAM: Standing PWR moves 08/09/23  GOALS: Goals reviewed with patient? Yes  SHORT TERM GOALS: Target date: 08/16/23  I with initial HEP Baseline: Goal status: 08/15/22-Provided patient with written PWR move instructions for standing activities, met  LONG TERM GOALS:  Target date: 10/05/23  I with final HEP Baseline:  Goal status: INITIAL  2.  Patient will score 28/30 on FGA to demonstrate decreased fall risk Baseline:  Goal status: INITIAL  3.  Patient will achieve 100% on M-CTSIB Baseline:  Goal status: 08/09/23-passed screen, met  4.  Patient will demonstrate normalized gait pattern while ambulating at least 800' on level and unlevel surfaces including normalized step length, no shuffling or festination. I gait Baseline:  Goal status: progressing 08/23/23 still some issues with right arm swing  5.  Patient will demonstrate the ability to climb up or down 12 steps going step over step, no UE support Baseline:  Goal status: progressing 08/23/23 needs hand support  6.  Patient will identify appropriate activities and the appropriate balance of activities to address her Parkinson's with living her daily life.  Baseline: Currently reports that her treatment activities are a bit overwhelming. Goal status: INITIAL  ASSESSMENT:  CLINICAL IMPRESSION: Patient continues to improve in her balance and speed of movement. I added fast motions, high level balance with some math problems, we also ended with some flexibility, she did have some issues with ankle and knee weakness and fatigue toward the end.  Trying to decrease the level of stress she feels from the Parkinson's to move and just find athletic activities she enjoys. Treatment focused on high level blaance training including SLS and speed of movement.  OBJECTIVE IMPAIRMENTS: Abnormal gait, decreased activity tolerance, decreased balance, decreased coordination, decreased endurance, difficulty walking, decreased ROM, decreased strength, impaired flexibility, and impaired tone.   ACTIVITY LIMITATIONS: carrying, lifting, bending, standing, squatting, stairs, transfers, and locomotion level  PARTICIPATION LIMITATIONS: meal prep, cleaning, laundry, driving, shopping, and community activity  PERSONAL  FACTORS: Past/current experiences are also affecting patient's functional outcome.   REHAB POTENTIAL: Good  CLINICAL DECISION MAKING: Evolving/moderate complexity  EVALUATION COMPLEXITY: Moderate  PLAN:  PT FREQUENCY: 2x/week  PT DURATION: 10 weeks  PLANNED INTERVENTIONS: 97110-Therapeutic exercises, 97530- Therapeutic activity, 97112- Neuromuscular re-education, 97535- Self Care, [id number]- Manual therapy, [phone]- Gait training, Patient/Family education, Balance training, Dry Needling, Joint mobilization, Spinal mobilization, Cryotherapy, and Moist heat  PLAN FOR NEXT SESSION: continue to progress with large amplitude movements, emphasis on slowed and controlled moves.  Ozell Mainland, PT 08/23/23 2:58 PM  08/23/2023, 2:58 PM

## 2023-08-23 NOTE — Therapy (Signed)
 OUTPATIENT OCCUPATIONAL THERAPY [facility] treatment  Patient Name: Anita Schmidt MRN: [id number] DOB:March 09, 1956, 68 y.o., female Today's Date: 08/25/2023  PCP: Dr. Pura REFERRING PROVIDER: Dr. Onita  END OF SESSION:  OT End of Session - 08/25/23 0915     Visit Number 5    Number of Visits 25    Date for OT Re-Evaluation 10/19/23    Authorization Time Period 12 weeks    Authorization - Visit Number 4    OT Start Time 1404    OT Stop Time 1444    OT Time Calculation (min) 40 min    Activity Tolerance Patient tolerated treatment well    Behavior During Therapy WFL for tasks assessed/performed                Past Medical History:  Diagnosis Date   Anxiety    severe   Anxiety disorder    Basal cell carcinoma 01/12/2011   BCC LEFT SIDE OF NOSE TX=MOHS   Fibrocystic breast disease    GERD (gastroesophageal reflux disease)    triggered with anxiety-not treated with meds   Gilbert's syndrome    Heart murmur    slight murmur dx by MD- no treatment-EKG clear   Hemochromatosis    High blood pressure    on meds   Hyperlipidemia    diet controlled   Skin cancer, basal cell    nose   Past Surgical History:  Procedure Laterality Date   BREAST BIOPSY Bilateral    COLONOSCOPY     colonscopy  2011   Tics ; Dr Jakie   DILATION AND CURETTAGE OF UTERUS     INGUINAL HERNIA REPAIR     MOHS SURGERY  2012   nose   TONSILLECTOMY     Tracer Pellets  2003   in breast; [facility]   WISDOM TOOTH EXTRACTION     Patient Active Problem List   Diagnosis Date Noted   Hyperreflexia 05/31/2023   Parkinsonism (HCC) 05/31/2023   Right leg weakness 01/07/2021   Weakness of right hip 01/07/2021   Affective disorder (HCC) 10/18/2019   Abnormal EKG 07/03/2018   Infraspinatus strain 04/20/2018   It band syndrome, left 04/20/2018   Cough 10/27/2017   Wheezing 10/27/2017   Anxiety 10/27/2017   Hyperglycemia 04/07/2017   Insect bite 04/07/2017   Cardiac murmur 12/29/2016   Pes  anserine bursitis 10/19/2016   Scapular dyskinesis 10/19/2016   Preventative health care 03/11/2016   Generalized anxiety disorder 05/24/2013   Benign essential hypertension 05/23/2013   Diverticulosis of colon without hemorrhage 04/27/2013   Malignant basal cell neoplasm of skin 04/27/2013   Hemochromatosis    Bee sting allergy 03/26/2011   IBS 02/10/2010   Anxiety state 12/25/2008   DISC DISEASE, CERVICAL 11/19/2008   Allergic rhinitis 09/19/2008   Hyperlipidemia 08/01/2008   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 hands Pt accompanied by: self  PERTINENT HISTORY:  Parkinsonism, recently diagnosed  November 2024, however symptomatic with decreased arm swing for several years. PMHx of  HTN, ostoporosis, anxiety    PRECAUTIONS: Fall  WEIGHT BEARING RESTRICTIONS: No  PAIN:  Are you having pain? No  FALLS: Has patient fallen in last 6 months? No  LIVING ENVIRONMENT: Lives with: lives alone Lives in: House/apartment Stairs: Yes: Internal  PLOF: Independent  PATIENT GOALS: maintain independence  OBJECTIVE:  Note: Objective measures were completed at Evaluation unless otherwise noted.  HAND DOMINANCE: Right  ADLs: Overall ADLs: mod I with all basic ADLs. Transfers/ambulation related to ADLs: Eating: mod I Grooming: mod I UB Dressing: mod I LB Dressing: mod I Toileting: mod  Bathing: mod I Tub Shower transfers: mod I   IADLs:Pt reports  she is mod I with all home management S Handwriting: 100% legible, however pt reports micrographia as she writes  for longer time period.  MOBILITY STATUS: Independent     FUNCTIONAL OUTCOME MEASURES: Fastening/unfastening 3 buttons: 41.40 Physical performance test: PPT#2 (simulated eating) 14.75 secs  & PPT#4 (donning/doffing jacket): 22.42 secs  COORDINATION: 9 Hole Peg test: Right: 30.76 sec; Left: 32.94 sec Box and Blocks:  Right 36 blocks, Left 50 blocks  UE ROM:   shoulder flexion RUE 140, LUE 140, Elbow extension RUE -10, LUE -5    SENSATION: WFL  MUSCLE TONE: RUE: Rigidity  COGNITION: Overall cognitive status: Within functional limits for tasks assessed  OBSERVATIONS: Bradykinesia                                                                                                                    TREATMENT DATE:08/23/23- PWR ! hands followed by placing and removing grooved pegs from pegboard with larger amplitude movements Discussed safety for use of weights, specifically kettlebell, and use of controlled movements and not using too heavy weight to avoid a rotator cuff tear. Therapist demonstrated and pt returned demonstration with controlled movements for bilateral shoulder flexion with kettlebell and then unilateral use.SABRA Nida PWR! rock and twist, 10 reps each , min v.c  discussed ways pt is incorporating RUE inot dailyactivities more with big movements.  08/18/23- see pt. education section,  Bag exercises for simulated ADLS: donning socks, donning shirt, tucking in shirt, min-mod v.c for amplitude and RUE functional use.  08/16/23- Reviewed PWR! hands basic 4, 5- 10 reps each, min v.c and demonstration. Pt was instructed in coordination HEP, min-mod v.c for amplitude. Beginning discussion regarding big movements with ADL tasks. Pt practiced opening and closing containers with larger amplitude movement strategies.   08/10/23- see pt. education Handwriting actiives  with continuousl and education regarding positioning  07/27/23- eval     PATIENT EDUCATION: Education details: safety for use of weight and lobbyist  Person educated: Patient Education method: Explanation, demonstration,  Education comprehension: verbalized understanding, returned demonstration  HOME EXERCISE PROGRAM: PWR hands, PWR! basic 4 modified quadraped-08/10/23 handwriting with PD- 08/10/23 coordination 08/16/23 ADL strategies with big movments-08/18/23 GOALS: Goals reviewed with patient? Yes  SHORT TERM GOALS: Target date: 08/27/23  I with PD specific HEP  Goal status:  ongoing  2. Pt  will demonstrate increased ease with dressing as evidenced by decreasing PPT#4(don/ doff jacket) to 19 secs or less Baseline: 22.42 sec Goal status:  ongoing  3.  Pt will write a short paragraph with 100% legibility and no significant decrease in letter size Baseline, pt reports micrographia when writing a paragraph Goal status: ongpoing  4.  Pt will demonstrate improved RUE functional use as evidenced by increasing box/ blocks score to 40 blocks or greater,  Goal status: ongoing  5.  I with adapted strategies for ADLS to maximize pt safety and I.   Goal status: ongoing   LONG TERM GOALS: Target date: 10/19/23  Pt will demonstrate improved ease with fastening buttons as evidenced by decreasing 3 button/ unbutton time to :38 secs or less  Baseline: 41.40 Goal status: INITIAL  2.   Pt will verbalize understanding of ways to prevent future PD related complications and PD community resources.        Goal status: INITIAL  3.  Pt will demonstrate improved ease with feeding as evidenced by decreasing PPT#2 to 11 secs or less. Baseline: 14.75 Goal status: INITIAL  ASSESSMENT:  CLINICAL IMPRESSION: Pt. is progressing towards goals. Pt reports using big movements with ADLS  and incorporating RUE more.PERFORMANCE DEFICITS: in functional skills including ADLs, IADLs, coordination,  dexterity, tone, ROM, flexibility, Fine motor control, Gross motor control, mobility, balance, decreased knowledge of use of DME, and UE functional use, brafykinesia, rigidity, and psychosocial skills including coping strategies, environmental adaptation, habits, interpersonal interactions, and routines and behaviors.   IMPAIRMENTS: are limiting patient from ADLs, IADLs, rest and sleep, play, leisure, and social participation.   COMORBIDITIES:  may have co-morbidities  that affects occupational performance. Patient will benefit from skilled OT to address above impairments and improve overall function.  MODIFICATION OR ASSISTANCE TO COMPLETE EVALUATION: No modification of tasks or assist necessary to complete an evaluation.  OT OCCUPATIONAL PROFILE AND HISTORY: Detailed assessment: Review of records and additional review of physical, cognitive, psychosocial history related to current functional performance.  CLINICAL DECISION MAKING: LOW - limited treatment options, no task modification necessary  REHAB POTENTIAL: Good  EVALUATION COMPLEXITY: Low    PLAN:  OT FREQUENCY: 2x/week  OT DURATION: 12 weeks (anticipate d/c after 8 weeks dependent on progress)  PLANNED INTERVENTIONS: 02831 OT Re-evaluation, 97535 self care/ADL training, 02889 therapeutic exercise, 97530 therapeutic activity, 97112 neuromuscular re-education, 97140 manual therapy, 97035 ultrasound, 97018 paraffin, 02989 moist heat, 97010 cryotherapy, 97034 contrast bath, passive range of motion, balance training, energy conservation, coping strategies training, patient/family education, and DME and/or AE instructions  RECOMMENDED OTHER SERVICES: PT  CONSULTED AND AGREED WITH PLAN OF CARE: Patient  PLAN FOR NEXT SESSION:  reinforce big movement strategies for ADLs/I ADLs , PWR! moves supine?   Abelardo Seidner, OT 08/25/2023, 9:16 AM

## 2023-08-25 ENCOUNTER — Ambulatory Visit: Payer: Medicare HMO | Admitting: Physical Therapy

## 2023-08-25 ENCOUNTER — Encounter: Payer: Self-pay | Admitting: Physical Therapy

## 2023-08-25 ENCOUNTER — Ambulatory Visit: Payer: Medicare HMO

## 2023-08-25 ENCOUNTER — Ambulatory Visit: Payer: Medicare HMO | Admitting: Occupational Therapy

## 2023-08-25 DIAGNOSIS — R278 Other lack of coordination: Secondary | ICD-10-CM

## 2023-08-25 DIAGNOSIS — R29818 Other symptoms and signs involving the nervous system: Secondary | ICD-10-CM

## 2023-08-25 DIAGNOSIS — R2681 Unsteadiness on feet: Secondary | ICD-10-CM

## 2023-08-25 DIAGNOSIS — G20A1 Parkinson's disease without dyskinesia, without mention of fluctuations: Secondary | ICD-10-CM

## 2023-08-25 DIAGNOSIS — R2689 Other abnormalities of gait and mobility: Secondary | ICD-10-CM

## 2023-08-25 DIAGNOSIS — M25621 Stiffness of right elbow, not elsewhere classified: Secondary | ICD-10-CM

## 2023-08-25 DIAGNOSIS — R262 Difficulty in walking, not elsewhere classified: Secondary | ICD-10-CM

## 2023-08-25 NOTE — Therapy (Signed)
 OUTPATIENT PHYSICAL THERAPY NEURO TREATMENT   Patient Name: Anita Schmidt MRN: [id number] DOB:22-Jun-1956, 68 y.o., female Today's Date: 08/25/2023   PCP: Anita Lenis, MD REFERRING PROVIDER: Onita Duos, MD   END OF SESSION:  PT End of Session - 08/25/23 1313     Visit Number 7    Date for PT Re-Evaluation 10/05/23    PT Start Time 1313    PT Stop Time 1400    PT Time Calculation (min) 47 min    Activity Tolerance Patient tolerated treatment well    Behavior During Therapy WFL for tasks assessed/performed             Past Medical History:  Diagnosis Date   Anxiety    severe   Anxiety disorder    Basal cell carcinoma 01/12/2011   BCC LEFT SIDE OF NOSE TX=MOHS   Fibrocystic breast disease    GERD (gastroesophageal reflux disease)    triggered with anxiety-not treated with meds   Gilbert's syndrome    Heart murmur    slight murmur dx by MD- no treatment-EKG clear   Hemochromatosis    High blood pressure    on meds   Hyperlipidemia    diet controlled   Skin cancer, basal cell    nose   Past Surgical History:  Procedure Laterality Date   BREAST BIOPSY Bilateral    COLONOSCOPY     colonscopy  2011   Tics ; Anita Schmidt   DILATION AND CURETTAGE OF UTERUS     INGUINAL HERNIA REPAIR     MOHS SURGERY  2012   nose   TONSILLECTOMY     Tracer Pellets  2003   in breast; [facility]   WISDOM TOOTH EXTRACTION     Patient Active Problem List   Diagnosis Date Noted   Hyperreflexia 05/31/2023   Parkinsonism (HCC) 05/31/2023   Right leg weakness 01/07/2021   Weakness of right hip 01/07/2021   Affective disorder (HCC) 10/18/2019   Abnormal EKG 07/03/2018   Infraspinatus strain 04/20/2018   It band syndrome, left 04/20/2018   Cough 10/27/2017   Wheezing 10/27/2017   Anxiety 10/27/2017   Hyperglycemia 04/07/2017   Insect bite 04/07/2017   Cardiac murmur 12/29/2016   Pes anserine bursitis 10/19/2016   Scapular dyskinesis 10/19/2016   Preventative health care  03/11/2016   Generalized anxiety disorder 05/24/2013   Benign essential hypertension 05/23/2013   Diverticulosis of colon without hemorrhage 04/27/2013   Malignant basal cell neoplasm of skin 04/27/2013   Hemochromatosis    Bee sting allergy 03/26/2011   IBS 02/10/2010   Anxiety state 12/25/2008   DISC DISEASE, CERVICAL 11/19/2008   Allergic rhinitis 09/19/2008   Hyperlipidemia 08/01/2008   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   MENOPAUSAL SYNDROME 08/01/2008   Personal history of disease 08/01/2008    ONSET DATE: 07/24/23  REFERRING DIAG: G20.C (ICD-10-CM) - Parkinsonism, unspecified Parkinsonism type (HCC)   THERAPY DIAG:  Other symptoms and signs involving the nervous system  Parkinson's disease without dyskinesia, unspecified whether manifestations fluctuate (HCC)  Other abnormalities of gait and mobility  Difficulty in walking, not elsewhere classified  Unsteadiness on feet  Rationale for Evaluation and Treatment: Rehabilitation  SUBJECTIVE:  SUBJECTIVE STATEMENT: Patient reports being tired after the last treatment  Pt accompanied by: self  PERTINENT HISTORY:  Per referring phaysican note: Anita Schmidt is a 68 y.o. 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 Yes: NPRS scale: patient did not state. Pain location: R shoulder Pain description:  N/A Aggravating factors: certain motions Relieving factors: avoiding the aggravating movements.  PRECAUTIONS: Fall  RED FLAGS: None   WEIGHT BEARING RESTRICTIONS: No  FALLS: Has patient fallen in last 6 months? No  LIVING ENVIRONMENT: Lives with: lives alone Lives in: House/apartment Stairs: Yes: Internal: 14 steps; on left going up and External: 4 steps; bilateral but cannot reach both Has following equipment at home: None  PLOF: Independent  PATIENT GOALS: Patient would like to identify any problems, Parkinson's education to minimize the impact on her daily life.  OBJECTIVE:  Note: Objective measures were completed at Evaluation unless otherwise noted.  DIAGNOSTIC FINDINGS:  Cerv CT 12/24 IMPRESSION: 1. No acute intracranial process. 2. No acute fracture or traumatic listhesis in the cervical spine. 3. Multilevel degenerative changes in the cervical spine, with mild-to-moderate spinal canal stenosis at C5-C6 and mild spinal canal stenosis at C6-C7. 4. Multilevel neural foraminal narrowing, severe on the right at C5-C6 and moderate bilaterally at C3-C4 and on the left at C5-C6. 5. 1.9 cm hypodense lesion in the right thyroid  lobe, with areas of calcification. If this has not previously been evaluated, a non-emergent ultrasound of the thyroid  is recommended. (Reference: J Am Coll Radiol. 2015 Feb;12(2): 143-50) 6. 7 mm ground-glass nodule in the right lung. Initial follow-up with CT at 6 months is recommended to confirm persistence. If persistent, repeat CT is recommended every 2 years until 5 years of stability has been established.  COGNITION: Overall cognitive status: Within functional limits for tasks assessed   SENSATION: Patient denies any change  COORDINATION: Heel on shin WNL  EDEMA:  swelling  MUSCLE TONE: no rigidity noted  MUSCLE LENGTH: Hamstrings: Right 85 deg; Left 75 deg Thomas test: Mildly tight  POSTURE: rounded shoulders and R scapula  winging, patient reports spinal curvature  LOWER EXTREMITY ROM:  WNL B    LOWER EXTREMITY MMT:  5/5 BLE  BED MOBILITY:  I  TRANSFERS: I RAMP:  I  CURB:  I  STAIRS: Level of Assistance: Modified independence Stair Negotiation Technique: Step to Pattern with Single Rail on Left Number of Stairs: 14  Comments: Patient reports that she does fine on steps as long as she uses a rail.  GAIT: Gait pattern: step through pattern, decreased arm swing- Right, decreased arm swing- Left, decreased step length- Right, and decreased step length- Left Distance walked: In clinic distances Assistive device utilized: None Level of assistance: Complete Independence Comments: Patient reports that she tries to focus on longer steps.  FUNCTIONAL TESTS:  5 times sit to stand: 11.69 Timed up and  go (TUG): 8.24, cog 8.84, dual 9.37 Functional gait assessment: 24/30                                                                                                                             TREATMENT DATE:  08/25/23 Ambulation outside around the back building 1 lap fast pace On bosu reaching for numbers on wall On bosu head turns On airex ball toss Step turn touch PWR moves rotation Step volley ball 4 step with cues Tmill push fwd and backward Small jumps side to side, front to back  Passive LE stretches  08/23/23 Ambulated brisk pace around the back building x 2 some cues for right arm swing Back building stairs 2 full flights step over step On bosu two ways ball toss, volley ball On wobble board balance On bosu 2 ways reaching for numbers on wall with some math Skipping, power skipping LE stretches  08/18/23 Ambulated outdoors on unlevel surfaces, up and down curbs, fast walking speed, up and down inclines, no unsteadiness noted. Alternating cone taps while standing on airex pad, progressed to multiple taps, then taps with rotation.  1 foot on stool, rolling stool side to side with  rotation in SLS. 4 square stepping, first clockwise, then counter clockwise, then changing directions unexpectedly. She got caught leaning the wrong way multiple times, but was able to adjust with no LOB. Heel rise, toes raise on airex plank, able to shift through full range.  08/16/23 Bike L4 x 4 min warm up AR with 5#, 1 x 10 reps each side Ball on wall, UUE, 5x each direction, x 2 Standing on upside down BOSU, static, lateral weight shifts, ant/post weight shifts, clock face. B side step onto and off airex pad with wide steps, x 10 each direction. B side step on airex plank x 5 each way. SLS-forward I with arms overhead, x 10 on each leg B side step against G tband 5 x 8 steps in each direction, band started at knees, but moved to mid shin to increase resistance.  08/10/23:  Nustep level 5, 5 min 30 sec, LE's only, did not like the UE movement, too abrupt Reviewed and practiced the PWR moves that were provided last session.  She performed well.  We discussed performing in front of mirror to monitor her position particularly for R arm Supine for manual stretching for B hamstrings 60 sec holds, once each leg Prone for R shoulder horizontal abd palm towards floor for engaging posterior shoulder musculature Practiced gait stepping over different height boxes, initially she tended to sling her leg around the box but corrected easily with instruction.  Single leg stance, able to stand for over 30 sec each foot.  08/09/23 Bike L3 x 5 minutes M-CTSIB screen- Completed 1 x 30 sec at each level, S, mildly increased sway with eyes closed on level ground and on airex, but no LOB PWR moves- standing, x 10 reps each SLS pivots to  touch mat and return to start at approximately 24, then 20, slightly more difficulty in L stance Heel raises on step, B, x 20 reps, with UE support  07/27/23 Education  PATIENT EDUCATION: Education details: POC Person educated: Patient Education method:  Explanation Education comprehension: verbalized understanding  HOME EXERCISE PROGRAM: Standing PWR moves 08/09/23  GOALS: Goals reviewed with patient? Yes  SHORT TERM GOALS: Target date: 08/16/23  I with initial HEP Baseline: Goal status: 08/15/22-Provided patient with written PWR move instructions for standing activities, met  LONG TERM GOALS: Target date: 10/05/23  I with final HEP Baseline:  Goal status: INITIAL  2.  Patient will score 28/30 on FGA to demonstrate decreased fall risk Baseline:  Goal status: INITIAL  3.  Patient will achieve 100% on M-CTSIB Baseline:  Goal status: 08/09/23-passed screen, met  4.  Patient will demonstrate normalized gait pattern while ambulating at least 800' on level and unlevel surfaces including normalized step length, no shuffling or festination. I gait Baseline:  Goal status: progressing 08/23/23 still some issues with right arm swing  5.  Patient will demonstrate the ability to climb up or down 12 steps going step over step, no UE support Baseline:  Goal status: progressing 08/23/23 needs hand support  6.  Patient will identify appropriate activities and the appropriate balance of activities to address her Parkinson's with living her daily life.  Baseline: Currently reports that her treatment activities are a bit overwhelming. Goal status: INITIAL  ASSESSMENT:  CLINICAL IMPRESSION: Patient continues to improve in her balance and speed of movement. I continued to add higher level balance activities as well as better reciprocal patterns for the arms and legs, she really does well, fatigues easily and at times will have a little loss of coordination and balance Treatment focused on high level balance training including SLS and speed of movement.  OBJECTIVE IMPAIRMENTS: Abnormal gait, decreased activity tolerance, decreased balance, decreased coordination, decreased endurance, difficulty walking, decreased ROM, decreased strength, impaired  flexibility, and impaired tone.   ACTIVITY LIMITATIONS: carrying, lifting, bending, standing, squatting, stairs, transfers, and locomotion level  PARTICIPATION LIMITATIONS: meal prep, cleaning, laundry, driving, shopping, and community activity  PERSONAL FACTORS: Past/current experiences are also affecting patient's functional outcome.   REHAB POTENTIAL: Good  CLINICAL DECISION MAKING: Evolving/moderate complexity  EVALUATION COMPLEXITY: Moderate  PLAN:  PT FREQUENCY: 2x/week  PT DURATION: 10 weeks  PLANNED INTERVENTIONS: 97110-Therapeutic exercises, 97530- Therapeutic activity, 97112- Neuromuscular re-education, 97535- Self Care, 02859- Manual therapy, [phone]- Gait training, Patient/Family education, Balance training, Dry Needling, Joint mobilization, Spinal mobilization, Cryotherapy, and Moist heat  PLAN FOR NEXT SESSION: continue to progress with large amplitude movements, emphasis on slowed and controlled moves.  Ozell Mainland, PT 08/25/23 1:13 PM  08/25/2023, 1:13 PM

## 2023-08-25 NOTE — Therapy (Signed)
 OUTPATIENT OCCUPATIONAL THERAPY [facility]  Patient Name: Anita Schmidt MRN: [id number] DOB:1955/10/22, 68 y.o., female Today's Date: 08/25/2023  PCP: Dr. Pura REFERRING PROVIDER: Dr. Onita  END OF SESSION:  OT End of Session - 08/25/23 1516     Visit Number 6    Number of Visits 25    Date for OT Re-Evaluation 10/19/23    Authorization Time Period 12 weeks    Authorization - Visit Number 6    Progress Note Due on Visit 10    OT Start Time 1403    OT Stop Time 1445    OT Time Calculation (min) 42 min    Activity Tolerance Patient tolerated treatment well    Behavior During Therapy WFL for tasks assessed/performed                 Past Medical History:  Diagnosis Date   Anxiety    severe   Anxiety disorder    Basal cell carcinoma 01/12/2011   BCC LEFT SIDE OF NOSE TX=MOHS   Fibrocystic breast disease    GERD (gastroesophageal reflux disease)    triggered with anxiety-not treated with meds   Gilbert's syndrome    Heart murmur    slight murmur dx by MD- no treatment-EKG clear   Hemochromatosis    High blood pressure    on meds   Hyperlipidemia    diet controlled   Skin cancer, basal cell    nose   Past Surgical History:  Procedure Laterality Date   BREAST BIOPSY Bilateral    COLONOSCOPY     colonscopy  2011   Tics ; Dr Jakie   DILATION AND CURETTAGE OF UTERUS     INGUINAL HERNIA REPAIR     MOHS SURGERY  2012   nose   TONSILLECTOMY     Tracer Pellets  2003   in breast; [facility]   WISDOM TOOTH EXTRACTION     Patient Active Problem List   Diagnosis Date Noted   Hyperreflexia 05/31/2023   Parkinsonism (HCC) 05/31/2023   Right leg weakness 01/07/2021   Weakness of right hip 01/07/2021   Affective disorder (HCC) 10/18/2019   Abnormal EKG 07/03/2018   Infraspinatus strain 04/20/2018   It band syndrome, left 04/20/2018   Cough 10/27/2017   Wheezing 10/27/2017   Anxiety 10/27/2017   Hyperglycemia 04/07/2017   Insect bite 04/07/2017    Cardiac murmur 12/29/2016   Pes anserine bursitis 10/19/2016   Scapular dyskinesis 10/19/2016   Preventative health care 03/11/2016   Generalized anxiety disorder 05/24/2013   Benign essential hypertension 05/23/2013   Diverticulosis of colon without hemorrhage 04/27/2013   Malignant basal cell neoplasm of skin 04/27/2013   Hemochromatosis    Bee sting allergy 03/26/2011   IBS 02/10/2010   Anxiety state 12/25/2008   DISC DISEASE, CERVICAL 11/19/2008   Allergic rhinitis 09/19/2008   Hyperlipidemia 08/01/2008   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 hands Pt accompanied by: self  PERTINENT HISTORY:  Parkinsonism, recently diagnosed  November 2024, however symptomatic with decreased arm swing for several years. PMHx of HTN, ostoporosis, anxiety    PRECAUTIONS: Fall  WEIGHT BEARING RESTRICTIONS: No  PAIN:  Are you having pain? No  FALLS: Has patient fallen in last 6 months? No  LIVING ENVIRONMENT: Lives with: lives alone Lives in: House/apartment Stairs: Yes: Internal  PLOF: Independent  PATIENT GOALS: maintain independence  OBJECTIVE:  Note: Objective measures were completed at Evaluation unless otherwise noted.  HAND DOMINANCE: Right  ADLs: Overall ADLs: mod I with all basic ADLs. Transfers/ambulation related to ADLs: Eating: mod I Grooming: mod I UB Dressing: mod I LB Dressing: mod I Toileting: mod   Bathing: mod I Tub Shower transfers: mod I   IADLs:Pt reports she is mod I with all home management S Handwriting: 100% legible, however pt reports micrographia as she writes  for longer time period.  MOBILITY STATUS: Independent     FUNCTIONAL OUTCOME MEASURES: Fastening/unfastening 3 buttons: 41.40 Physical performance test: PPT#2 (simulated eating) 14.75 secs  & PPT#4 (donning/doffing jacket): 22.42 secs  COORDINATION: 9 Hole Peg test: Right: 30.76 sec; Left: 32.94 sec Box and Blocks:  Right 36 blocks, Left 50 blocks  UE ROM:   shoulder flexion RUE 140, LUE 140, Elbow extension RUE -10, LUE -5    SENSATION: WFL  MUSCLE TONE: RUE: Rigidity  COGNITION: Overall cognitive status: Within functional limits for tasks assessed  OBSERVATIONS: Bradykinesia                                                                                                                    TREATMENT DATE:08/25/23 UBE x 6 mins level 1 for conditioning, min v.c for equal use of right and left UE's Reveiwed PWR! standing basic 4, 10-20 reps each, min v.c for amplitude. ADL strategies and practice for: scrambling an egg, donning and doffing jacket with larger amplitude movements. flipping and dealing playing cards with min v.c for amplitude, min-mod difficulty with RUE today.  08/23/23- PWR ! hands followed by placing and removing grooved pegs from pegboard with larger amplitude movements Discussed safety for use of weights, specifically kettlebell, and use of controlled movements and not using too heavy weight to avoid a rotator cuff tear. Therapist demonstrated and pt returned demonstration with controlled movements for bilateral shoulder flexion with kettlebell and then unilateral use.SABRA Nida PWR! rock and twist, 10 reps each , min v.c  discussed ways pt is incorporating RUE inot dailyactivities more with big movements.  08/18/23- see pt. education section,  Bag exercises for simulated ADLS:  donning socks, donning shirt, tucking in shirt, min-mod v.c for amplitude and RUE functional use.  08/16/23- Reviewed PWR! hands basic 4, 5- 10 reps each, min v.c and demonstration. Pt was instructed in coordination HEP, min-mod v.c for amplitude. Beginning discussion regarding big movements with ADL tasks. Pt practiced opening and closing containers with larger amplitude movement strategies.   08/10/23- see pt. education Handwriting actiives with continuousl and education regarding positioning  07/27/23- eval  PATIENT EDUCATION: Education details: PWR! moves basic 4 standing, adapted strategies for donning/ doffing jacket, scramble eggs Person educated: Patient Education method: Explanation, demonstration, v.c  Education comprehension: verbalized understanding, returned demonstration  HOME EXERCISE PROGRAM: PWR hands, PWR! basic 4 modified quadraped-08/10/23 handwriting with PD- 08/10/23 coordination 08/16/23 ADL strategies with big movments-08/18/23 PWR! standing GOALS: Goals reviewed with patient? Yes  SHORT TERM GOALS: Target date: 08/27/23  I with PD specific HEP  Goal status:  ongoing  2. Pt will demonstrate increased ease with dressing as evidenced by decreasing PPT#4(don/ doff jacket) to 19 secs or less Baseline: 22.42 sec Goal status:  ongoing  3.  Pt will write a short paragraph with 100% legibility and no significant decrease in letter size Baseline, pt reports micrographia when writing a paragraph Goal status: ongoing  4.  Pt will demonstrate improved RUE functional use as evidenced by increasing box/ blocks score to 40 blocks or greater,  Goal status: ongoing  5.  I with adapted strategies for ADLS to maximize pt safety and I.   Goal status: initiated, will reinforce   LONG TERM GOALS: Target date: 10/19/23  Pt will demonstrate improved ease with fastening buttons as evidenced by decreasing 3 button/ unbutton time to :38 secs or less  Baseline: 41.40 Goal  status: INITIAL  2.   Pt will verbalize understanding of ways to prevent future PD related complications and PD community resources.        Goal status: INITIAL  3.  Pt will demonstrate improved ease with feeding as evidenced by decreasing PPT#2 to 11 secs or less. Baseline: 14.75 Goal status: INITIAL  ASSESSMENT:  CLINICAL IMPRESSION: Pt. is progressing towards goals. Pt r is progressing towards goals with improved RUE functional use.PERFORMANCE DEFICITS: in functional skills including ADLs, IADLs, coordination, dexterity, tone, ROM, flexibility, Fine motor control, Gross motor control, mobility, balance, decreased knowledge of use of DME, and UE functional use, brafykinesia, rigidity, and psychosocial skills including coping strategies, environmental adaptation, habits, interpersonal interactions, and routines and behaviors.   IMPAIRMENTS: are limiting patient from ADLs, IADLs, rest and sleep, play, leisure, and social participation.   COMORBIDITIES:  may have co-morbidities  that affects occupational performance. Patient will benefit from skilled OT to address above impairments and improve overall function.  MODIFICATION OR ASSISTANCE TO COMPLETE EVALUATION: No modification of tasks or assist necessary to complete an evaluation.  OT OCCUPATIONAL PROFILE AND HISTORY: Detailed assessment: Review of records and additional review of physical, cognitive, psychosocial history related to current functional performance.  CLINICAL DECISION MAKING: LOW - limited treatment options, no task modification necessary  REHAB POTENTIAL: Good  EVALUATION COMPLEXITY: Low    PLAN:  OT FREQUENCY: 2x/week  OT DURATION: 12 weeks (anticipate d/c after 8 weeks dependent on progress)  PLANNED INTERVENTIONS: 02831 OT Re-evaluation, 97535 self care/ADL training, 02889 therapeutic exercise, 97530 therapeutic activity, 97112 neuromuscular re-education, 97140 manual therapy, 97035 ultrasound, 97018 paraffin,  02989 moist heat, 97010 cryotherapy, 97034 contrast bath, passive range of motion, balance training, energy conservation, coping strategies training, patient/family education, and DME and/or AE instructions  RECOMMENDED OTHER SERVICES: PT  CONSULTED AND AGREED WITH PLAN OF CARE: Patient  PLAN FOR NEXT SESSION:  check short term goals,reinforce big movement strategies for ADLs/I ADLs ,    Tilden Broz, OT 08/25/2023, 3:18 PM

## 2023-09-01 ENCOUNTER — Encounter: Payer: Self-pay | Admitting: Hematology

## 2023-09-01 ENCOUNTER — Inpatient Hospital Stay: Payer: Medicare HMO | Attending: Hematology

## 2023-09-01 LAB — CBC WITH DIFFERENTIAL (CANCER CENTER ONLY)
Abs Immature Granulocytes: 0.01 10*3/uL (ref 0.00–0.07)
Basophils Absolute: 0.1 10*3/uL (ref 0.0–0.1)
Basophils Relative: 1 %
Eosinophils Absolute: 0.1 10*3/uL (ref 0.0–0.5)
Eosinophils Relative: 1 %
HCT: 44.2 % (ref 36.0–46.0)
Hemoglobin: 15 g/dL (ref 12.0–15.0)
Immature Granulocytes: 0 %
Lymphocytes Relative: 37 %
Lymphs Abs: 1.7 10*3/uL (ref 0.7–4.0)
MCH: 32.1 pg (ref 26.0–34.0)
MCHC: 33.9 g/dL (ref 30.0–36.0)
MCV: 94.4 fL (ref 80.0–100.0)
Monocytes Absolute: 0.5 10*3/uL (ref 0.1–1.0)
Monocytes Relative: 10 %
Neutro Abs: 2.4 10*3/uL (ref 1.7–7.7)
Neutrophils Relative %: 51 %
Platelet Count: 282 10*3/uL (ref 150–400)
RBC: 4.68 MIL/uL (ref 3.87–5.11)
RDW: 11.9 % (ref 11.5–15.5)
WBC Count: 4.7 10*3/uL (ref 4.0–10.5)
nRBC: 0 % (ref 0.0–0.2)

## 2023-09-01 LAB — FERRITIN: Ferritin: 25 ng/mL (ref 11–307)

## 2023-09-01 LAB — IRON AND IRON BINDING CAPACITY (CC-WL,HP ONLY)
Iron: 119 ug/dL (ref 28–170)
Saturation Ratios: 37 % — ABNORMAL HIGH (ref 10.4–31.8)
TIBC: 325 ug/dL (ref 250–450)
UIBC: 206 ug/dL (ref 148–442)

## 2023-09-06 ENCOUNTER — Encounter: Payer: Medicare HMO | Admitting: Physical Therapy

## 2023-09-07 ENCOUNTER — Ambulatory Visit: Payer: Medicare HMO | Admitting: Physical Therapy

## 2023-09-07 ENCOUNTER — Ambulatory Visit: Payer: Medicare HMO | Admitting: Occupational Therapy

## 2023-09-07 ENCOUNTER — Encounter: Payer: Self-pay | Admitting: Occupational Therapy

## 2023-09-07 DIAGNOSIS — R278 Other lack of coordination: Secondary | ICD-10-CM | POA: Diagnosis not present

## 2023-09-07 DIAGNOSIS — R29818 Other symptoms and signs involving the nervous system: Secondary | ICD-10-CM

## 2023-09-07 DIAGNOSIS — M25621 Stiffness of right elbow, not elsewhere classified: Secondary | ICD-10-CM

## 2023-09-07 DIAGNOSIS — R2681 Unsteadiness on feet: Secondary | ICD-10-CM

## 2023-09-07 DIAGNOSIS — R2689 Other abnormalities of gait and mobility: Secondary | ICD-10-CM

## 2023-09-07 NOTE — Therapy (Signed)
OUTPATIENT OCCUPATIONAL THERAPY [facility] treatment  Patient Name: Anita Schmidt MRN: [id number] DOB:1956-05-15, 68 y.o., female Today's Date: 09/07/2023  PCP: Dr. Everlene Other REFERRING PROVIDER: Dr. Terrace Arabia  END OF SESSION:  OT End of Session - 09/07/23 0852     Visit Number 7    Number of Visits 25    Date for OT Re-Evaluation 10/19/23    Authorization Type [employer]    Authorization Time Period 12 weeks    Authorization - Visit Number 7    Progress Note Due on Visit 10    OT Start Time 0850    OT Stop Time 0928    OT Time Calculation (min) 38 min    Activity Tolerance Patient tolerated treatment well    Behavior During Therapy WFL for tasks assessed/performed                 Past Medical History:  Diagnosis Date   Anxiety    severe   Anxiety disorder    Basal cell carcinoma 01/12/2011   BCC LEFT SIDE OF NOSE TX=MOHS   Fibrocystic breast disease    GERD (gastroesophageal reflux disease)    triggered with anxiety-not treated with meds   Gilbert's syndrome    Heart murmur    slight murmur dx by MD- no treatment-EKG clear   Hemochromatosis    High blood pressure    on meds   Hyperlipidemia    diet controlled   Skin cancer, basal cell    nose   Past Surgical History:  Procedure Laterality Date   BREAST BIOPSY Bilateral    COLONOSCOPY     colonscopy  2011   Tics ; Dr Jarold Motto   DILATION AND CURETTAGE OF UTERUS     INGUINAL HERNIA REPAIR     MOHS SURGERY  2012   nose   TONSILLECTOMY     Tracer Pellets  2003   in breast; [facility]   WISDOM TOOTH EXTRACTION     Patient Active Problem List   Diagnosis Date Noted   Hyperreflexia 05/31/2023   Parkinsonism (HCC) 05/31/2023   Right leg weakness 01/07/2021   Weakness of right hip 01/07/2021   Affective disorder (HCC) 10/18/2019   Abnormal EKG 07/03/2018   Infraspinatus strain 04/20/2018   It band syndrome, left 04/20/2018   Cough 10/27/2017   Wheezing 10/27/2017   Anxiety 10/27/2017    Hyperglycemia 04/07/2017   Insect bite 04/07/2017   Cardiac murmur 12/29/2016   Pes anserine bursitis 10/19/2016   Scapular dyskinesis 10/19/2016   Preventative health care 03/11/2016   Generalized anxiety disorder 05/24/2013   Benign essential hypertension 05/23/2013   Diverticulosis of colon without hemorrhage 04/27/2013   Malignant basal cell neoplasm of skin 04/27/2013   Hemochromatosis    Bee sting allergy 03/26/2011   IBS 02/10/2010   Anxiety state 12/25/2008   DISC DISEASE, CERVICAL 11/19/2008   Allergic rhinitis 09/19/2008   Hyperlipidemia 08/01/2008   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  Rationale for Evaluation and Treatment: Rehabilitation  SUBJECTIVE:   SUBJECTIVE STATEMENT: Pt  requests to review PWR! hands Pt accompanied by: self  PERTINENT HISTORY:  Parkinsonism, recently diagnosed  November 2024, however symptomatic with decreased arm swing for several years. PMHx of HTN, ostoporosis, anxiety    PRECAUTIONS: Fall  WEIGHT BEARING RESTRICTIONS: No  PAIN:  Are you having pain? No  FALLS: Has patient fallen in last 6 months? No  LIVING ENVIRONMENT: Lives with: lives alone Lives in: House/apartment Stairs: Yes: Internal  PLOF: Independent  PATIENT GOALS: maintain independence  OBJECTIVE:  Note: Objective measures were completed at Evaluation unless otherwise noted.  HAND DOMINANCE: Right  ADLs: Overall ADLs: mod I with all basic ADLs. Transfers/ambulation related to ADLs: Eating: mod I Grooming: mod I UB Dressing: mod I LB Dressing: mod I Toileting: mod  Bathing: mod I Tub Shower transfers: mod I   IADLs:Pt reports she is mod I with all home management S Handwriting: 100% legible, however  pt reports micrographia as she writes  for longer time period.  MOBILITY STATUS: Independent     FUNCTIONAL OUTCOME MEASURES: Fastening/unfastening 3 buttons: 41.40 Physical performance test: PPT#2 (simulated eating) 14.75 secs  & PPT#4 (donning/doffing jacket): 22.42 secs  COORDINATION: 9 Hole Peg test: Right: 30.76 sec; Left: 32.94 sec Box and Blocks:  Right 36 blocks, Left 50 blocks  UE ROM:   shoulder flexion RUE 140, LUE 140, Elbow extension RUE -10, LUE -5    SENSATION: WFL  MUSCLE TONE: RUE: Rigidity  COGNITION: Overall cognitive status: Within functional limits for tasks assessed  OBSERVATIONS: Bradykinesia                                                                                                                    TREATMENT DATE:09/07/23 PWR! up, rock and twist in supine, 10 reps each min v.c Dynamic stap and reach flipping palaying cards for amplitude, min v.c Stacking coins with bilateral UE's simultaneously for increased coordination, min v.c for amplitude Education regarding community resources  08/25/23 UBE x 6 mins level 1 for conditioning, min v.c for equal use of right and left UE's Reveiwed PWR! standing basic 4, 10-20 reps each, min v.c for amplitude. ADL strategies and practice for: scrambling an egg, donning and doffing jacket with larger amplitude movements. flipping and dealing playing cards with min v.c for amplitude, min-mod difficulty with RUE today.  08/23/23- PWR ! hands followed by placing and removing grooved pegs from pegboard with larger amplitude movements Discussed safety for use of weights, specifically kettlebell, and use of controlled movements and not using too heavy weight to avoid a rotator cuff tear. Therapist demonstrated and pt returned demonstration with controlled movements for bilateral shoulder flexion with kettlebell and then unilateral use.Alinda Deem PWR! rock and twist, 10 reps each , min v.c  discussed ways pt is  incorporating RUE inot dailyactivities more with big movements.  08/18/23- see pt. education section,  Bag exercises for simulated ADLS: donning socks, donning shirt, tucking in shirt, min-mod v.c for amplitude and RUE functional use.  08/16/23- Reviewed PWR! hands basic 4, 5- 10 reps each, min v.c and demonstration. Pt was instructed in coordination HEP, min-mod v.c for amplitude. Beginning discussion regarding big movements with ADL tasks. Pt practiced opening and closing containers with larger amplitude movement strategies.  08/10/23- see pt. education Handwriting actiives with continuous"l" and education regarding positioning  07/27/23- eval     PATIENT EDUCATION: Education details:community resources, recommendation that pt does not do boxing due to risk for injury Person educated: Patient Education method: Explanation, demonstration, v.c  Education comprehension: verbalized understanding, returned demonstration  HOME EXERCISE PROGRAM: PWR hands, PWR! basic 4 modified quadraped-08/10/23 handwriting with PD- 08/10/23 coordination 08/16/23 ADL strategies with big movments-08/18/23 PWR! standing GOALS: Goals reviewed with patient? Yes  SHORT TERM GOALS: Target date: 08/27/23  I with PD specific HEP  Goal status:  met, pt demonstrates understanding   2. Pt will demonstrate increased ease with dressing as evidenced by decreasing PPT#4(don/ doff jacket) to 19 secs or less Baseline: 22.42 sec Goal status:  ongoing  3.  Pt will write a short paragraph with 100% legibility and no significant decrease in letter size Baseline, pt reports micrographia when writing a paragraph Goal status: ongoing  4.  Pt will demonstrate improved RUE functional use as evidenced by increasing box/ blocks score to 40 blocks or greater,  Goal status: ongoing  5.  I with adapted strategies for ADLS to maximize pt safety and I.   Goal status: ongoing, initiated  pt can benefit from reinforcement,  09/07/23   LONG TERM GOALS: Target date: 10/19/23  Pt will demonstrate improved ease with fastening buttons as evidenced by decreasing 3 button/ unbutton time to :38 secs or less  Baseline: 41.40 Goal status: INITIAL  2.   Pt will verbalize understanding of ways to prevent future PD related complications and PD community resources.        Goal status: INITIAL  3.  Pt will demonstrate improved ease with feeding as evidenced by decreasing PPT#2 to 11 secs or less. Baseline: 14.75 Goal status: INITIAL  ASSESSMENT:  CLINICAL IMPRESSION: Pt. is progressing towards goals. Pt demonstrates improved functional use of RUE during ADLS.PERFORMANCE DEFICITS: in functional skills including ADLs, IADLs, coordination, dexterity, tone, ROM, flexibility, Fine motor control, Gross motor control, mobility, balance, decreased knowledge of use of DME, and UE functional use, brafykinesia, rigidity, and psychosocial skills including coping strategies, environmental adaptation, habits, interpersonal interactions, and routines and behaviors.   IMPAIRMENTS: are limiting patient from ADLs, IADLs, rest and sleep, play, leisure, and social participation.   COMORBIDITIES:  may have co-morbidities  that affects occupational performance. Patient will benefit from skilled OT to address above impairments and improve overall function.  MODIFICATION OR ASSISTANCE TO COMPLETE EVALUATION: No modification of tasks or assist necessary to complete an evaluation.  OT OCCUPATIONAL PROFILE AND HISTORY: Detailed assessment: Review of records and additional review of physical, cognitive, psychosocial history related to current functional performance.  CLINICAL DECISION MAKING: LOW - limited treatment options, no task modification necessary  REHAB POTENTIAL: Good  EVALUATION COMPLEXITY: Low    PLAN:  OT FREQUENCY: 2x/week  OT DURATION: 12 weeks (anticipate d/c after 8 weeks dependent on progress)  PLANNED INTERVENTIONS:  09811 OT Re-evaluation, 97535 self care/ADL training, 91478 therapeutic exercise, 97530 therapeutic activity, 97112 neuromuscular re-education, 97140 manual therapy, 97035 ultrasound, 97018 paraffin, 29562 moist heat, 97010 cryotherapy, 97034 contrast bath, passive range of motion, balance training, energy conservation, coping strategies training, patient/family education, and DME and/or AE instructions  RECOMMENDED OTHER SERVICES: PT  CONSULTED AND AGREED WITH PLAN OF CARE: Patient  PLAN FOR NEXT SESSION:  check short term goals, RUE functional use    Perfecto Purdy, OT 09/07/2023, 9:26 AM

## 2023-09-12 ENCOUNTER — Ambulatory Visit: Payer: Medicare HMO | Admitting: Occupational Therapy

## 2023-09-12 DIAGNOSIS — R29818 Other symptoms and signs involving the nervous system: Secondary | ICD-10-CM

## 2023-09-12 DIAGNOSIS — R278 Other lack of coordination: Secondary | ICD-10-CM

## 2023-09-12 DIAGNOSIS — R2681 Unsteadiness on feet: Secondary | ICD-10-CM

## 2023-09-12 DIAGNOSIS — R2689 Other abnormalities of gait and mobility: Secondary | ICD-10-CM

## 2023-09-12 DIAGNOSIS — M25621 Stiffness of right elbow, not elsewhere classified: Secondary | ICD-10-CM

## 2023-09-12 NOTE — Therapy (Signed)
 OUTPATIENT OCCUPATIONAL THERAPY [facility]  Patient Name: Anita Schmidt MRN: [id number] DOB:[birth year], 68 y.o., female Today's Date: 09/12/2023  PCP: Dr. Everlene Other REFERRING PROVIDER: Dr. Terrace Arabia  END OF SESSION:  OT End of Session - 09/12/23 0819     Visit Number 8    Number of Visits 25    Date for OT Re-Evaluation 10/19/23    Authorization Type [employer]    Authorization - Visit Number 8    Progress Note Due on Visit 10    OT Start Time 0803    OT Stop Time 0845    OT Time Calculation (min) 42 min                 Past Medical History:  Diagnosis Date   Anxiety    severe   Anxiety disorder    Basal cell carcinoma 01/12/2011   BCC LEFT SIDE OF NOSE TX=MOHS   Fibrocystic breast disease    GERD (gastroesophageal reflux disease)    triggered with anxiety-not treated with meds   Gilbert's syndrome    Heart murmur    slight murmur dx by MD- no treatment-EKG clear   Hemochromatosis    High blood pressure    on meds   Hyperlipidemia    diet controlled   Skin cancer, basal cell    nose   Past Surgical History:  Procedure Laterality Date   BREAST BIOPSY Bilateral    COLONOSCOPY     colonscopy  2011   Tics ; Dr Jarold Motto   DILATION AND CURETTAGE OF UTERUS     INGUINAL HERNIA REPAIR     MOHS SURGERY  2012   nose   TONSILLECTOMY     Tracer Pellets  2003   in breast; [facility]   WISDOM TOOTH EXTRACTION     Patient Active Problem List   Diagnosis Date Noted   Hyperreflexia 05/31/2023   Parkinsonism (HCC) 05/31/2023   Right leg weakness 01/07/2021   Weakness of right hip 01/07/2021   Affective disorder (HCC) 10/18/2019   Abnormal EKG 07/03/2018   Infraspinatus strain 04/20/2018   It band syndrome, left 04/20/2018   Cough 10/27/2017   Wheezing 10/27/2017   Anxiety 10/27/2017   Hyperglycemia 04/07/2017   Insect bite 04/07/2017   Cardiac murmur 12/29/2016   Pes anserine bursitis 10/19/2016   Scapular dyskinesis 10/19/2016   Preventative  health care 03/11/2016   Generalized anxiety disorder 05/24/2013   Benign essential hypertension 05/23/2013   Diverticulosis of colon without hemorrhage 04/27/2013   Malignant basal cell neoplasm of skin 04/27/2013   Hemochromatosis    Bee sting allergy 03/26/2011   IBS 02/10/2010   Anxiety state 12/25/2008   DISC DISEASE, CERVICAL 11/19/2008   Allergic rhinitis 09/19/2008   Hyperlipidemia 08/01/2008   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   MENOPAUSAL SYNDROME 08/01/2008   Personal history of disease 08/01/2008    ONSET DATE: 07/04/23  REFERRING DIAG:  Diagnosis  G20.C (ICD-10-CM) - Parkinsonism, unspecified Parkinsonism type (HCC)    THERAPY DIAG:  Other symptoms and signs involving the nervous system  Other lack of coordination  Stiffness of right elbow, not elsewhere classified  Unsteadiness on feet  Other abnormalities of gait and mobility  Rationale for Evaluation and Treatment: Rehabilitation  SUBJECTIVE:   SUBJECTIVE STATEMENT: Pt reports she went for a hike this weekend Pt accompanied by: self  PERTINENT HISTORY:  Parkinsonism, recently diagnosed  November 2024, however symptomatic with decreased arm swing for several years. PMHx of HTN, ostoporosis, anxiety  PRECAUTIONS: Fall  WEIGHT BEARING RESTRICTIONS: No  PAIN:  Are you having pain? No  FALLS: Has patient fallen in last 6 months? No  LIVING ENVIRONMENT: Lives with: lives alone Lives in: House/apartment Stairs: Yes: Internal  PLOF: Independent  PATIENT GOALS: maintain independence  OBJECTIVE:  Note: Objective measures were completed at Evaluation unless otherwise noted.  HAND DOMINANCE: Right  ADLs: Overall ADLs: mod I with all basic ADLs. Transfers/ambulation related to ADLs: Eating: mod I Grooming: mod I UB Dressing: mod I LB Dressing: mod I Toileting: mod  Bathing: mod I Tub Shower transfers: mod I   IADLs:Pt reports she is mod I with all home  management S Handwriting: 100% legible, however pt reports micrographia as she writes  for longer time period.  MOBILITY STATUS: Independent     FUNCTIONAL OUTCOME MEASURES: Fastening/unfastening 3 buttons: 41.40 Physical performance test: PPT#2 (simulated eating) 14.75 secs  & PPT#4 (donning/doffing jacket): 22.42 secs  COORDINATION: 9 Hole Peg test: Right: 30.76 sec; Left: 32.94 sec Box and Blocks:  Right 36 blocks, Left 50 blocks  UE ROM:   shoulder flexion RUE 140, LUE 140, Elbow extension RUE -10, LUE -5    SENSATION: WFL  MUSCLE TONE: RUE: Rigidity  COGNITION: Overall cognitive status: Within functional limits for tasks assessed  OBSERVATIONS: Bradykinesia                                                                                                                    TREATMENT DATE:09/12/23- Therapist checked several goals. See goals for progress.  UBE x 6 mins level 2 for conditioning, min v.c to mainatin 40 rpm, pt reports task was challenging. Standing PWR! up, rock, twist and step 10 reps each min v.c for amplitude. Pt practiced donning/ doffing hospital gown like a jacket with larger amplitude movments multiple trials, mod v.c for RUE functional use ans pt tends to use non dominant LUE as dominant hand. Pt appears to be having difficulty with adjustment to PD diagnosis. Dynamic step and reach to copy small peg design with RUE, min v.c for amplitude, pt reports fatigue after task. Pt wrote a paragraph with 100% legibility and no significant decrease in letter size. 09/07/23 PWR! up, rock and twist in supine, 10 reps each min v.c Dynamic step and reach flipping palaying cards for amplitude, min v.c Stacking coins with bilateral UE's simultaneously for increased coordination, min v.c for amplitude Education regarding community resources  08/25/23 UBE x 6 mins level 1 for conditioning, min v.c for equal use of right and left UE's Reveiwed PWR! standing basic 4, 10-20  reps each, min v.c for amplitude. ADL strategies and practice for: scrambling an egg, donning and doffing jacket with larger amplitude movements. flipping and dealing playing cards with min v.c for amplitude, min-mod difficulty with RUE today.  08/23/23- PWR ! hands followed by placing and removing grooved pegs from pegboard with larger amplitude movements Discussed safety for use of weights, specifically kettlebell, and use of controlled movements and not using too  heavy weight to avoid a rotator cuff tear. Therapist demonstrated and pt returned demonstration with controlled movements for bilateral shoulder flexion with kettlebell and then unilateral use.Alinda Deem PWR! rock and twist, 10 reps each , min v.c  discussed ways pt is incorporating RUE inot dailyactivities more with big movements.  08/18/23- see pt. education section,  Bag exercises for simulated ADLS: donning socks, donning shirt, tucking in shirt, min-mod v.c for amplitude and RUE functional use.  08/16/23- Reviewed PWR! hands basic 4, 5- 10 reps each, min v.c and demonstration. Pt was instructed in coordination HEP, min-mod v.c for amplitude. Beginning discussion regarding big movements with ADL tasks. Pt practiced opening and closing containers with larger amplitude movement strategies.   08/10/23- see pt. education Handwriting actiives with continuous"l" and education regarding positioning  07/27/23- eval     PATIENT EDUCATION: Education details:adapted strategy for donning/ doffing jacket, importance of RUE use as dominant hand during ADLs. Person educated: Patient Education method: Explanation, demonstration, v.c  Education comprehension: verbalized understanding, returned demonstration  HOME EXERCISE PROGRAM: PWR hands, PWR! basic 4 modified quadraped-08/10/23 handwriting with PD- 08/10/23 coordination 08/16/23 ADL strategies with big movments-08/18/23 PWR! standing GOALS: Goals reviewed with patient? Yes  SHORT TERM  GOALS: Target date: 08/27/23  I with PD specific HEP  Goal status:  met, pt demonstrates understanding   2. Pt will demonstrate increased ease with dressing as evidenced by decreasing PPT#4(don/ doff jacket) to 19 secs or less Baseline: 22.42 sec Goal status:  ongoing  3.  Pt will write a short paragraph with 100% legibility and no significant decrease in letter size Baseline, pt reports micrographia when writing a paragraph Goal status: met, 09/12/23 4.  Pt will demonstrate improved RUE functional use as evidenced by increasing box/ blocks score to 40 blocks or greater,  Goal status: ongoing  5.  I with adapted strategies for ADLS to maximize pt safety and I.   Goal status: ongoing, initiated  pt can benefit from reinforcement, 09/07/23   LONG TERM GOALS: Target date: 10/19/23  Pt will demonstrate improved ease with fastening buttons as evidenced by decreasing 3 button/ unbutton time to :38 secs or less  Baseline: 41.40 Goal status: INITIAL  2.   Pt will verbalize understanding of ways to prevent future PD related complications and PD community resources.        Goal status: INITIAL  3.  Pt will demonstrate improved ease with feeding as evidenced by decreasing PPT#2 to 11 secs or less. Baseline: 14.75 Goal status: INITIAL  ASSESSMENT:  CLINICAL IMPRESSION: Pt. is progressing towards goals. Pt continues to require v.c for RUE functional use during ADLS. She has learned to use LUE as more dominant hand and she has been encouraged to increase RUE dominant functional use.PERFORMANCE DEFICITS: in functional skills including ADLs, IADLs, coordination, dexterity, tone, ROM, flexibility, Fine motor control, Gross motor control, mobility, balance, decreased knowledge of use of DME, and UE functional use, brafykinesia, rigidity, and psychosocial skills including coping strategies, environmental adaptation, habits, interpersonal interactions, and routines and behaviors.   IMPAIRMENTS: are  limiting patient from ADLs, IADLs, rest and sleep, play, leisure, and social participation.   COMORBIDITIES:  may have co-morbidities  that affects occupational performance. Patient will benefit from skilled OT to address above impairments and improve overall function.  MODIFICATION OR ASSISTANCE TO COMPLETE EVALUATION: No modification of tasks or assist necessary to complete an evaluation.  OT OCCUPATIONAL PROFILE AND HISTORY: Detailed assessment: Review of records and additional review of physical, cognitive,  psychosocial history related to current functional performance.  CLINICAL DECISION MAKING: LOW - limited treatment options, no task modification necessary  REHAB POTENTIAL: Good  EVALUATION COMPLEXITY: Low    PLAN:  OT FREQUENCY: 2x/week  OT DURATION: 12 weeks (anticipate d/c after 8 weeks dependent on progress)  PLANNED INTERVENTIONS: 29562 OT Re-evaluation, 97535 self care/ADL training, 13086 therapeutic exercise, 97530 therapeutic activity, 97112 neuromuscular re-education, 97140 manual therapy, 97035 ultrasound, 97018 paraffin, 57846 moist heat, 97010 cryotherapy, 97034 contrast bath, passive range of motion, balance training, energy conservation, coping strategies training, patient/family education, and DME and/or AE instructions  RECOMMENDED OTHER SERVICES: PT  CONSULTED AND AGREED WITH PLAN OF CARE: Patient  PLAN FOR NEXT SESSION:  check short term goals, RUE functional use , work towards   Andreah Goheen, OT 09/12/2023, 5:03 PM

## 2023-09-14 ENCOUNTER — Ambulatory Visit: Payer: Medicare HMO | Admitting: Physical Therapy

## 2023-09-14 ENCOUNTER — Encounter: Payer: Self-pay | Admitting: Physical Therapy

## 2023-09-14 DIAGNOSIS — R278 Other lack of coordination: Secondary | ICD-10-CM

## 2023-09-14 DIAGNOSIS — R262 Difficulty in walking, not elsewhere classified: Secondary | ICD-10-CM

## 2023-09-14 DIAGNOSIS — G20A1 Parkinson's disease without dyskinesia, without mention of fluctuations: Secondary | ICD-10-CM

## 2023-09-14 DIAGNOSIS — R2681 Unsteadiness on feet: Secondary | ICD-10-CM

## 2023-09-14 DIAGNOSIS — R29818 Other symptoms and signs involving the nervous system: Secondary | ICD-10-CM

## 2023-09-14 NOTE — Therapy (Signed)
 OUTPATIENT PHYSICAL THERAPY NEURO TREATMENT   Patient Name: Anita Schmidt MRN: [id number] DOB:25-Nov-1955, 68 y.o., female Today's Date: 09/14/2023   PCP: Tracey Harries, MD REFERRING PROVIDER: Levert Feinstein, MD   END OF SESSION:  PT End of Session - 09/14/23 0931     Visit Number 8    Date for PT Re-Evaluation 10/05/23    PT Start Time 0930    PT Stop Time 1015    PT Time Calculation (min) 45 min    Activity Tolerance Patient tolerated treatment well    Behavior During Therapy [facility] for tasks assessed/performed             Past Medical History:  Diagnosis Date   Anxiety    severe   Anxiety disorder    Basal cell carcinoma 01/12/2011   BCC LEFT SIDE OF NOSE TX=MOHS   Fibrocystic breast disease    GERD (gastroesophageal reflux disease)    triggered with anxiety-not treated with meds   Gilbert's syndrome    Heart murmur    slight murmur dx by MD- no treatment-EKG clear   Hemochromatosis    High blood pressure    on meds   Hyperlipidemia    diet controlled   Skin cancer, basal cell    nose   Past Surgical History:  Procedure Laterality Date   BREAST BIOPSY Bilateral    COLONOSCOPY     colonscopy  2011   Tics ; Dr Jarold Motto   DILATION AND CURETTAGE OF UTERUS     INGUINAL HERNIA REPAIR     MOHS SURGERY  2012   nose   TONSILLECTOMY     Tracer Pellets  2003   in breast; [facility]   WISDOM TOOTH EXTRACTION     Patient Active Problem List   Diagnosis Date Noted   Hyperreflexia 05/31/2023   Parkinsonism (HCC) 05/31/2023   Right leg weakness 01/07/2021   Weakness of right hip 01/07/2021   Affective disorder (HCC) 10/18/2019   Abnormal EKG 07/03/2018   Infraspinatus strain 04/20/2018   It band syndrome, left 04/20/2018   Cough 10/27/2017   Wheezing 10/27/2017   Anxiety 10/27/2017   Hyperglycemia 04/07/2017   Insect bite 04/07/2017   Cardiac murmur 12/29/2016   Pes anserine bursitis 10/19/2016   Scapular dyskinesis 10/19/2016   Preventative health care  03/11/2016   Generalized anxiety disorder 05/24/2013   Benign essential hypertension 05/23/2013   Diverticulosis of colon without hemorrhage 04/27/2013   Malignant basal cell neoplasm of skin 04/27/2013   Hemochromatosis    Bee sting allergy 03/26/2011   IBS 02/10/2010   Anxiety state 12/25/2008   DISC DISEASE, CERVICAL 11/19/2008   Allergic rhinitis 09/19/2008   Hyperlipidemia 08/01/2008   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   MENOPAUSAL SYNDROME 08/01/2008   Personal history of disease 08/01/2008    ONSET DATE: 07/24/23  REFERRING DIAG: G20.C (ICD-10-CM) - Parkinsonism, unspecified Parkinsonism type (HCC)   THERAPY DIAG:  Other symptoms and signs involving the nervous system  Other lack of coordination  Unsteadiness on feet  Parkinson's disease without dyskinesia, unspecified whether manifestations fluctuate (HCC)  Difficulty in walking, not elsewhere classified  Rationale for Evaluation and Treatment: Rehabilitation  SUBJECTIVE:  SUBJECTIVE STATEMENT: Reports not sleeping well at times  Pt accompanied by: self  PERTINENT HISTORY:  Per referring phaysican note: Anita Schmidt is a 68 y.o. 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 Yes: NPRS scale: patient did not state. Pain location: R shoulder Pain description: N/A Aggravating factors: certain  motions Relieving factors: avoiding the aggravating movements.  PRECAUTIONS: Fall  RED FLAGS: None   WEIGHT BEARING RESTRICTIONS: No  FALLS: Has patient fallen in last 6 months? No  LIVING ENVIRONMENT: Lives with: lives alone Lives in: House/apartment Stairs: Yes: Internal: 14 steps; on left going up and External: 4 steps; bilateral but cannot reach both Has following equipment at home: None  PLOF: Independent  PATIENT GOALS: Patient would like to identify any problems, Parkinson's education to minimize the impact on her daily life.  OBJECTIVE:  Note: Objective measures were completed at Evaluation unless otherwise noted.  DIAGNOSTIC FINDINGS:  Cerv CT 12/24 IMPRESSION: 1. No acute intracranial process. 2. No acute fracture or traumatic listhesis in the cervical spine. 3. Multilevel degenerative changes in the cervical spine, with mild-to-moderate spinal canal stenosis at C5-C6 and mild spinal canal stenosis at C6-C7. 4. Multilevel neural foraminal narrowing, severe on the right at C5-C6 and moderate bilaterally at C3-C4 and on the left at C5-C6. 5. 1.9 cm hypodense lesion in the right thyroid lobe, with areas of calcification. If this has not previously been evaluated, a non-emergent ultrasound of the thyroid is recommended. (Reference: J Am Coll Radiol. 2015 Feb;12(2): 143-50) 6. 7 mm ground-glass nodule in the right lung. Initial follow-up with CT at 6 months is recommended to confirm persistence. If persistent, repeat CT is recommended every 2 years until 5 years of stability has been established.  COGNITION: Overall cognitive status: Within functional limits for tasks assessed   SENSATION: Patient denies any change  COORDINATION: Heel on shin WNL  EDEMA:  swelling  MUSCLE TONE: no rigidity noted  MUSCLE LENGTH: Hamstrings: Right 85 deg; Left 75 deg Thomas test: Mildly tight  POSTURE: rounded shoulders and R scapula winging, patient reports spinal  curvature  LOWER EXTREMITY ROM:  WNL B    LOWER EXTREMITY MMT:  5/5 BLE  BED MOBILITY:  I  TRANSFERS: I RAMP:  I  CURB:  I  STAIRS: Level of Assistance: Modified independence Stair Negotiation Technique: Step to Pattern with Single Rail on Left Number of Stairs: 14  Comments: Patient reports that she does fine on steps as long as she uses a rail.  GAIT: Gait pattern: step through pattern, decreased arm swing- Right, decreased arm swing- Left, decreased step length- Right, and decreased step length- Left Distance walked: In clinic distances Assistive device utilized: None Level of assistance: Complete Independence Comments: Patient reports that she tries to focus on longer steps.  FUNCTIONAL TESTS:  5 times sit to stand: 11.69 Timed up and go (TUG):  8.24, cog 8.84, dual 9.37 Functional gait assessment: 24/30                                                                                                                             TREATMENT DATE:  09/14/23 Gait around the back building 2 laps brisk pace, cues for right arm swing and right hand open Walking ball toss Side step on and off airex On airex volley ball On airex 10# alternating arm pulls On airex 5# alternating punches On upside down bosu reaching, ball toss 40# resisted gait all directions Passive stretch left hip all motions  08/25/23 Ambulation outside around the back building 1 lap fast pace On bosu reaching for numbers on wall On bosu head turns On airex ball toss Step turn touch PWR moves rotation Step volley ball 4 step with cues Tmill push fwd and backward Small jumps side to side, front to back  Passive LE stretches  08/23/23 Ambulated brisk pace around the back building x 2 some cues for right arm swing Back building stairs 2 full flights step over step On bosu two ways ball toss, volley ball On wobble board balance On bosu 2 ways reaching for numbers on wall with some math Skipping,  power skipping LE stretches  08/18/23 Ambulated outdoors on unlevel surfaces, up and down curbs, fast walking speed, up and down inclines, no unsteadiness noted. Alternating cone taps while standing on airex pad, progressed to multiple taps, then taps with rotation.  1 foot on stool, rolling stool side to side with rotation in SLS. 4 square stepping, first clockwise, then counter clockwise, then changing directions unexpectedly. She got caught leaning the wrong way multiple times, but was able to adjust with no LOB. Heel rise, toes raise on airex plank, able to shift through full range.  08/16/23 Bike L4 x 4 min warm up AR with 5#, 1 x 10 reps each side Ball on wall, UUE, 5x each direction, x 2 Standing on upside down BOSU, static, lateral weight shifts, ant/post weight shifts, clock face. B side step onto and off airex pad with wide steps, x 10 each direction. B side step on airex plank x 5 each way. SLS-forward "I" with arms overhead, x 10 on each leg B side step against G tband 5 x 8 steps in each direction, band started at knees, but moved to mid shin to increase resistance.  08/10/23:  Nustep level 5, 5 min 30 sec, LE's only, did not like the UE movement, too abrupt Reviewed and practiced the PWR moves that were provided last session.  She performed well.  We discussed performing in front of mirror to monitor her position particularly for R arm Supine for manual stretching for B hamstrings 60 sec holds, once each leg Prone for R shoulder horizontal abd palm towards floor for engaging posterior shoulder musculature Practiced gait stepping over different height boxes, initially she tended to sling her leg around the box but  corrected easily with instruction.  Single leg stance, able to stand for over 30 sec each foot.  08/09/23 Bike L3 x 5 minutes M-CTSIB screen- Completed 1 x 30 sec at each level, S, mildly increased sway with eyes closed on level ground and on airex, but no LOB PWR  moves- standing, x 10 reps each SLS pivots to touch mat and return to start at approximately 24", then 20", slightly more difficulty in L stance Heel raises on step, B, x 20 reps, with UE support  07/27/23 Education  PATIENT EDUCATION: Education details: POC Person educated: Patient Education method: Explanation Education comprehension: verbalized understanding  HOME EXERCISE PROGRAM: Standing PWR moves 08/09/23  GOALS: Goals reviewed with patient? Yes  SHORT TERM GOALS: Target date: 08/16/23  I with initial HEP Baseline: Goal status: 08/15/22-Provided patient with written PWR move instructions for standing activities, met  LONG TERM GOALS: Target date: 10/05/23  I with final HEP Baseline:  Goal status: progressing 09/14/23  2.  Patient will score 28/30 on FGA to demonstrate decreased fall risk Baseline:  Goal status: INITIAL  3.  Patient will achieve 100% on M-CTSIB Baseline:  Goal status: 08/09/23-passed screen, met  4.  Patient will demonstrate normalized gait pattern while ambulating at least 800' on level and unlevel surfaces including normalized step length, no shuffling or festination. I gait Baseline:  Goal status: progressing 08/2623 still some issues with right arm swing  5.  Patient will demonstrate the ability to climb up or down 12 steps going step over step, no UE support Baseline:  Goal status: progressing 09/14/23 needs hand support  6.  Patient will identify appropriate activities and the appropriate balance of activities to address her Parkinson's with living her daily life.  Baseline: Currently reports that her treatment activities are a bit overwhelming. Goal status: INITIAL  ASSESSMENT:  CLINICAL IMPRESSION: Patient continues to improve in her balance and speed of movement. I continued to add higher level balance activities as well as better reciprocal patterns for the arms and legs, She is needed some cues for right arm swing at times, when playing  catch she did struggle with the right arm catching  OBJECTIVE IMPAIRMENTS: Abnormal gait, decreased activity tolerance, decreased balance, decreased coordination, decreased endurance, difficulty walking, decreased ROM, decreased strength, impaired flexibility, and impaired tone.   ACTIVITY LIMITATIONS: carrying, lifting, bending, standing, squatting, stairs, transfers, and locomotion level  PARTICIPATION LIMITATIONS: meal prep, cleaning, laundry, driving, shopping, and community activity  PERSONAL FACTORS: Past/current experiences are also affecting patient's functional outcome.   REHAB POTENTIAL: Good  CLINICAL DECISION MAKING: Evolving/moderate complexity  EVALUATION COMPLEXITY: Moderate  PLAN:  PT FREQUENCY: 2x/week  PT DURATION: 10 weeks  PLANNED INTERVENTIONS: 97110-Therapeutic exercises, 97530- Therapeutic activity, 97112- Neuromuscular re-education, 97535- Self Care, 96295- Manual therapy, [phone]- Gait training, Patient/Family education, Balance training, Dry Needling, Joint mobilization, Spinal mobilization, Cryotherapy, and Moist heat  PLAN FOR NEXT SESSION: continue to progress with large amplitude movements, emphasis on slowed and controlled moves.  Stacie Glaze, PT 09/14/23 9:32 AM  09/14/2023, 9:32 AM

## 2023-09-21 ENCOUNTER — Ambulatory Visit: Payer: Medicare HMO | Attending: Neurology | Admitting: Physical Therapy

## 2023-09-21 ENCOUNTER — Ambulatory Visit: Payer: Medicare HMO | Admitting: Occupational Therapy

## 2023-09-21 ENCOUNTER — Encounter: Payer: Self-pay | Admitting: Occupational Therapy

## 2023-09-21 ENCOUNTER — Encounter: Payer: Self-pay | Admitting: Physical Therapy

## 2023-09-21 DIAGNOSIS — R262 Difficulty in walking, not elsewhere classified: Secondary | ICD-10-CM | POA: Insufficient documentation

## 2023-09-21 DIAGNOSIS — R2681 Unsteadiness on feet: Secondary | ICD-10-CM | POA: Insufficient documentation

## 2023-09-21 DIAGNOSIS — R2689 Other abnormalities of gait and mobility: Secondary | ICD-10-CM

## 2023-09-21 DIAGNOSIS — R278 Other lack of coordination: Secondary | ICD-10-CM | POA: Insufficient documentation

## 2023-09-21 DIAGNOSIS — G20A1 Parkinson's disease without dyskinesia, without mention of fluctuations: Secondary | ICD-10-CM | POA: Insufficient documentation

## 2023-09-21 DIAGNOSIS — R29818 Other symptoms and signs involving the nervous system: Secondary | ICD-10-CM | POA: Insufficient documentation

## 2023-09-21 DIAGNOSIS — M25621 Stiffness of right elbow, not elsewhere classified: Secondary | ICD-10-CM | POA: Insufficient documentation

## 2023-09-21 NOTE — Therapy (Signed)
 OUTPATIENT PHYSICAL THERAPY NEURO TREATMENT   Patient Name: Anita Schmidt MRN: [id number] DOB:1955-08-13, 68 y.o., female Today's Date: 09/21/2023   PCP: Tracey Harries, MD REFERRING PROVIDER: Levert Feinstein, MD   END OF SESSION:  PT End of Session - 09/21/23 0845     Visit Number 9    Date for PT Re-Evaluation 10/05/23    PT Start Time 0845    PT Stop Time 0930    PT Time Calculation (min) 45 min    Activity Tolerance Patient tolerated treatment well    Behavior During Therapy [facility] for tasks assessed/performed             Past Medical History:  Diagnosis Date   Anxiety    severe   Anxiety disorder    Basal cell carcinoma 01/12/2011   BCC LEFT SIDE OF NOSE TX=MOHS   Fibrocystic breast disease    GERD (gastroesophageal reflux disease)    triggered with anxiety-not treated with meds   Gilbert's syndrome    Heart murmur    slight murmur dx by MD- no treatment-EKG clear   Hemochromatosis    High blood pressure    on meds   Hyperlipidemia    diet controlled   Skin cancer, basal cell    nose   Past Surgical History:  Procedure Laterality Date   BREAST BIOPSY Bilateral    COLONOSCOPY     colonscopy  2011   Tics ; Dr Jarold Motto   DILATION AND CURETTAGE OF UTERUS     INGUINAL HERNIA REPAIR     MOHS SURGERY  2012   nose   TONSILLECTOMY     Tracer Pellets  2003   in breast; [facility]   WISDOM TOOTH EXTRACTION     Patient Active Problem List   Diagnosis Date Noted   Hyperreflexia 05/31/2023   Parkinsonism (HCC) 05/31/2023   Right leg weakness 01/07/2021   Weakness of right hip 01/07/2021   Affective disorder (HCC) 10/18/2019   Abnormal EKG 07/03/2018   Infraspinatus strain 04/20/2018   It band syndrome, left 04/20/2018   Cough 10/27/2017   Wheezing 10/27/2017   Anxiety 10/27/2017   Hyperglycemia 04/07/2017   Insect bite 04/07/2017   Cardiac murmur 12/29/2016   Pes anserine bursitis 10/19/2016   Scapular dyskinesis 10/19/2016   Preventative health care  03/11/2016   Generalized anxiety disorder 05/24/2013   Benign essential hypertension 05/23/2013   Diverticulosis of colon without hemorrhage 04/27/2013   Malignant basal cell neoplasm of skin 04/27/2013   Hemochromatosis    Bee sting allergy 03/26/2011   IBS 02/10/2010   Anxiety state 12/25/2008   DISC DISEASE, CERVICAL 11/19/2008   Allergic rhinitis 09/19/2008   Hyperlipidemia 08/01/2008   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   MENOPAUSAL SYNDROME 08/01/2008   Personal history of disease 08/01/2008    ONSET DATE: 07/24/23  REFERRING DIAG: G20.C (ICD-10-CM) - Parkinsonism, unspecified Parkinsonism type (HCC)   THERAPY DIAG:  Other symptoms and signs involving the nervous system  Other lack of coordination  Unsteadiness on feet  Parkinson's disease without dyskinesia, unspecified whether manifestations fluctuate (HCC)  Difficulty in walking, not elsewhere classified  Rationale for Evaluation and Treatment: Rehabilitation  SUBJECTIVE:  SUBJECTIVE STATEMENT: Patient reports that she is having some left hip and knee pain  Pt accompanied by: self  PERTINENT HISTORY:  Per referring phaysican note: Anita Schmidt is a 68 y.o. 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 Yes: NPRS scale: patient did not state. Pain location: R shoulder Pain description: N/A Aggravating  factors: certain motions Relieving factors: avoiding the aggravating movements.  PRECAUTIONS: Fall  RED FLAGS: None   WEIGHT BEARING RESTRICTIONS: No  FALLS: Has patient fallen in last 6 months? No  LIVING ENVIRONMENT: Lives with: lives alone Lives in: House/apartment Stairs: Yes: Internal: 14 steps; on left going up and External: 4 steps; bilateral but cannot reach both Has following equipment at home: None  PLOF: Independent  PATIENT GOALS: Patient would like to identify any problems, Parkinson's education to minimize the impact on her daily life.  OBJECTIVE:  Note: Objective measures were completed at Evaluation unless otherwise noted.  DIAGNOSTIC FINDINGS:  Cerv CT 12/24 IMPRESSION: 1. No acute intracranial process. 2. No acute fracture or traumatic listhesis in the cervical spine. 3. Multilevel degenerative changes in the cervical spine, with mild-to-moderate spinal canal stenosis at C5-C6 and mild spinal canal stenosis at C6-C7. 4. Multilevel neural foraminal narrowing, severe on the right at C5-C6 and moderate bilaterally at C3-C4 and on the left at C5-C6. 5. 1.9 cm hypodense lesion in the right thyroid lobe, with areas of calcification. If this has not previously been evaluated, a non-emergent ultrasound of the thyroid is recommended. (Reference: J Am Coll Radiol. 2015 Feb;12(2): 143-50) 6. 7 mm ground-glass nodule in the right lung. Initial follow-up with CT at 6 months is recommended to confirm persistence. If persistent, repeat CT is recommended every 2 years until 5 years of stability has been established.  COGNITION: Overall cognitive status: Within functional limits for tasks assessed   SENSATION: Patient denies any change  COORDINATION: Heel on shin WNL  EDEMA:  swelling  MUSCLE TONE: no rigidity noted  MUSCLE LENGTH: Hamstrings: Right 85 deg; Left 75 deg Thomas test: Mildly tight  POSTURE: rounded shoulders and R scapula winging, patient  reports spinal curvature  LOWER EXTREMITY ROM:  WNL B    LOWER EXTREMITY MMT:  5/5 BLE  BED MOBILITY:  I  TRANSFERS: I RAMP:  I  CURB:  I  STAIRS: Level of Assistance: Modified independence Stair Negotiation Technique: Step to Pattern with Single Rail on Left Number of Stairs: 14  Comments: Patient reports that she does fine on steps as long as she uses a rail.  GAIT: Gait pattern: step through pattern, decreased arm swing- Right, decreased arm swing- Left, decreased step length- Right, and decreased step length- Left Distance walked: In clinic distances Assistive device utilized: None Level of assistance: Complete Independence Comments: Patient reports that she tries to focus on longer steps.  FUNCTIONAL TESTS:  5 times sit to stand:  11.69 Timed up and go (TUG): 8.24, cog 8.84, dual 9.37 Functional gait assessment: 24/30                                                                                                                             TREATMENT DATE:  09/21/23 Elliptical 3 minutes level 3 PWR Moves in standing On bosu reaching out of BOS Step turn touch Step and reach Opposite elbow to opposite knee then hand to heel in front Supine feet on ball K2C, rotation, bridge and iso abs Passive stretch of the LE's STM with Tgun to the left hip flexor and quad  09/14/23 Gait around the back building 2 laps brisk pace, cues for right arm swing and right hand open Walking ball toss Side step on and off airex On airex volley ball On airex 10# alternating arm pulls On airex 5# alternating punches On upside down bosu reaching, ball toss 40# resisted gait all directions Passive stretch left hip all motions  08/25/23 Ambulation outside around the back building 1 lap fast pace On bosu reaching for numbers on wall On bosu head turns On airex ball toss Step turn touch PWR moves rotation Step volley ball 4 step with cues Tmill push fwd and backward Small jumps  side to side, front to back  Passive LE stretches  08/23/23 Ambulated brisk pace around the back building x 2 some cues for right arm swing Back building stairs 2 full flights step over step On bosu two ways ball toss, volley ball On wobble board balance On bosu 2 ways reaching for numbers on wall with some math Skipping, power skipping LE stretches  08/18/23 Ambulated outdoors on unlevel surfaces, up and down curbs, fast walking speed, up and down inclines, no unsteadiness noted. Alternating cone taps while standing on airex pad, progressed to multiple taps, then taps with rotation.  1 foot on stool, rolling stool side to side with rotation in SLS. 4 square stepping, first clockwise, then counter clockwise, then changing directions unexpectedly. She got caught leaning the wrong way multiple times, but was able to adjust with no LOB. Heel rise, toes raise on airex plank, able to shift through full range.  08/16/23 Bike L4 x 4 min warm up AR with 5#, 1 x 10 reps each side Ball on wall, UUE, 5x each direction, x 2 Standing on upside down BOSU, static, lateral weight shifts, ant/post weight shifts, clock face. B side step onto and off airex pad with wide steps, x 10 each direction. B side step on airex plank x 5 each way. SLS-forward "I" with arms overhead, x 10 on each leg B side step against G tband 5 x 8 steps in each direction, band started at knees, but moved to mid shin to increase resistance.  08/10/23:  Nustep level 5, 5 min 30 sec, LE's only, did not like the UE movement, too abrupt Reviewed and practiced the PWR moves that were provided last session.  She performed well.  We discussed performing in front of mirror to monitor her position particularly for R arm Supine for manual stretching for B hamstrings 60 sec holds, once each leg Prone for R shoulder horizontal abd palm towards floor for engaging posterior shoulder musculature Practiced gait stepping over different height  boxes, initially she tended to sling her leg around the box but corrected easily with instruction.  Single leg stance, able to stand for over 30 sec each foot.  PATIENT EDUCATION: Education details: POC Person educated: Patient Education method: Explanation Education comprehension: verbalized understanding  HOME EXERCISE PROGRAM: Standing PWR moves 08/09/23  GOALS: Goals reviewed with patient? Yes  SHORT TERM GOALS: Target date: 08/16/23  I with initial HEP Baseline: Goal status: 08/15/22-Provided patient with written PWR move instructions for standing activities, met  LONG TERM GOALS: Target date: 10/05/23  I with final HEP Baseline:  Goal status: progressing 09/14/23  2.  Patient will score 28/30 on FGA to demonstrate decreased fall risk Baseline:  Goal status: progressing 09/21/23  3.  Patient will achieve 100% on M-CTSIB Baseline:  Goal status: 08/09/23-passed screen, met  4.  Patient will demonstrate normalized gait pattern while ambulating at least 800' on level and unlevel surfaces including normalized step length, no shuffling or festination. I gait Baseline:  Goal status: progressing 09/21/23 still some issues with right arm swing  5.  Patient will demonstrate the ability to climb up or down 12 steps going step over step, no UE support Baseline:  Goal status: progressing 09/21/23/25 needs hand support  6.  Patient will identify appropriate activities and the appropriate balance of activities to address her Parkinson's with living her daily life.  Baseline: Currently reports that her treatment activities are a bit overwhelming. Goal status: progressing 09/21/23  ASSESSMENT:  CLINICAL IMPRESSION: Patient continues to improve in her balance and speed of movement. We continue to add higher multi joint motions with focus on some reciprocal motions, she needs cues to move the right arm at times, the hip is a little more sore today so we worked on this some. OBJECTIVE  IMPAIRMENTS: Abnormal gait, decreased activity tolerance, decreased balance, decreased coordination, decreased endurance, difficulty walking, decreased ROM, decreased strength, impaired flexibility, and impaired tone.   ACTIVITY LIMITATIONS: carrying, lifting, bending, standing, squatting, stairs, transfers, and locomotion level  PARTICIPATION LIMITATIONS: meal prep, cleaning, laundry, driving, shopping, and community activity  PERSONAL FACTORS: Past/current experiences are also affecting patient's functional outcome.   REHAB POTENTIAL: Good  CLINICAL DECISION MAKING: Evolving/moderate complexity  EVALUATION COMPLEXITY: Moderate  PLAN:  PT FREQUENCY: 2x/week  PT DURATION: 10 weeks  PLANNED INTERVENTIONS: 97110-Therapeutic exercises, 97530- Therapeutic activity, 97112- Neuromuscular re-education, 97535- Self Care, 16109- Manual therapy, [phone]- Gait training, Patient/Family education, Balance training, Dry Needling, Joint mobilization, Spinal mobilization, Cryotherapy, and Moist heat  PLAN FOR NEXT SESSION: continue to progress with large amplitude movements, emphasis on slowed and controlled moves.  Stacie Glaze, PT 09/21/23 8:45 AM  09/21/2023, 8:45 AM

## 2023-09-21 NOTE — Therapy (Signed)
 OUTPATIENT OCCUPATIONAL THERAPY [facility] treatment  Patient Name: Anita Schmidt MRN: [id number] DOB:20-Dec-1955, 68 y.o., female Today's Date: 09/21/2023  PCP: Dr. Everlene Other REFERRING PROVIDER: Dr. Terrace Arabia  END OF SESSION:  OT End of Session - 09/21/23 0812     Visit Number 8    Number of Visits 25    Date for OT Re-Evaluation 10/19/23    Authorization Type [employer]    Authorization Time Period 12 weeks    Authorization - Visit Number 8    Progress Note Due on Visit 10    OT Start Time 0803    OT Stop Time 0845    OT Time Calculation (min) 42 min                 Past Medical History:  Diagnosis Date   Anxiety    severe   Anxiety disorder    Basal cell carcinoma 01/12/2011   BCC LEFT SIDE OF NOSE TX=MOHS   Fibrocystic breast disease    GERD (gastroesophageal reflux disease)    triggered with anxiety-not treated with meds   Gilbert's syndrome    Heart murmur    slight murmur dx by MD- no treatment-EKG clear   Hemochromatosis    High blood pressure    on meds   Hyperlipidemia    diet controlled   Skin cancer, basal cell    nose   Past Surgical History:  Procedure Laterality Date   BREAST BIOPSY Bilateral    COLONOSCOPY     colonscopy  2011   Tics ; Dr Jarold Motto   DILATION AND CURETTAGE OF UTERUS     INGUINAL HERNIA REPAIR     MOHS SURGERY  2012   nose   TONSILLECTOMY     Tracer Pellets  2003   in breast; [facility]   WISDOM TOOTH EXTRACTION     Patient Active Problem List   Diagnosis Date Noted   Hyperreflexia 05/31/2023   Parkinsonism (HCC) 05/31/2023   Right leg weakness 01/07/2021   Weakness of right hip 01/07/2021   Affective disorder (HCC) 10/18/2019   Abnormal EKG 07/03/2018   Infraspinatus strain 04/20/2018   It band syndrome, left 04/20/2018   Cough 10/27/2017   Wheezing 10/27/2017   Anxiety 10/27/2017   Hyperglycemia 04/07/2017   Insect bite 04/07/2017   Cardiac murmur 12/29/2016   Pes anserine bursitis 10/19/2016   Scapular  dyskinesis 10/19/2016   Preventative health care 03/11/2016   Generalized anxiety disorder 05/24/2013   Benign essential hypertension 05/23/2013   Diverticulosis of colon without hemorrhage 04/27/2013   Malignant basal cell neoplasm of skin 04/27/2013   Hemochromatosis    Bee sting allergy 03/26/2011   IBS 02/10/2010   Anxiety state 12/25/2008   DISC DISEASE, CERVICAL 11/19/2008   Allergic rhinitis 09/19/2008   Hyperlipidemia 08/01/2008   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   MENOPAUSAL SYNDROME 08/01/2008   Personal history of disease 08/01/2008    ONSET DATE: 07/04/23  REFERRING DIAG:  Diagnosis  G20.C (ICD-10-CM) - Parkinsonism, unspecified Parkinsonism type (HCC)    THERAPY DIAG:  Other symptoms and signs involving the nervous system  Other lack of coordination  Unsteadiness on feet  Stiffness of right elbow, not elsewhere classified  Other abnormalities of gait and mobility  Rationale for Evaluation and Treatment: Rehabilitation  SUBJECTIVE:   SUBJECTIVE STATEMENT: Pt reports her left knee is bothering her  Pt accompanied by: self  PERTINENT HISTORY:  Parkinsonism, recently diagnosed  November 2024, however symptomatic with decreased arm swing for  several years. PMHx of HTN, ostoporosis, anxiety    PRECAUTIONS: Fall  WEIGHT BEARING RESTRICTIONS: No  PAIN:  Are you having pain? No  FALLS: Has patient fallen in last 6 months? No  LIVING ENVIRONMENT: Lives with: lives alone Lives in: House/apartment Stairs: Yes: Internal  PLOF: Independent  PATIENT GOALS: maintain independence  OBJECTIVE:  Note: Objective measures were completed at Evaluation unless otherwise noted.  HAND DOMINANCE: Right  ADLs: Overall ADLs: mod I with all basic ADLs. Transfers/ambulation related to ADLs: Eating: mod I Grooming: mod I UB Dressing: mod I LB Dressing: mod I Toileting: mod  Bathing: mod I Tub Shower transfers: mod I   IADLs:Pt reports she is  mod I with all home management S Handwriting: 100% legible, however pt reports micrographia as she writes  for longer time period.  MOBILITY STATUS: Independent     FUNCTIONAL OUTCOME MEASURES: Fastening/unfastening 3 buttons: 41.40 Physical performance test: PPT#2 (simulated eating) 14.75 secs  & PPT#4 (donning/doffing jacket): 22.42 secs  COORDINATION: 9 Hole Peg test: Right: 30.76 sec; Left: 32.94 sec Box and Blocks:  Right 36 blocks, Left 50 blocks  UE ROM:   shoulder flexion RUE 140, LUE 140, Elbow extension RUE -10, LUE -5    SENSATION: WFL  MUSCLE TONE: RUE: Rigidity  COGNITION: Overall cognitive status: Within functional limits for tasks assessed  OBSERVATIONS: Bradykinesia                                                                                                                    TREATMENT DATE:09/21/23-UBE x6 mins level 1 for conditioning. PWR! up, rock and twist in seated pt attempted seated step however this aggravated her left knee. PWR! hands for PWR! up, rock and twist 5-10 reps each with min v.c  Donning /doffing jacket with adapted strategies, min v.c initally, pt practiced several time swith good sucess. Therapist continued checking short term goals. Therapist discussed safe exercise with patient and the importance of avoiding secondary injury to shoulder and LE's. Therapist recommends arm bike, she can row but she should perform at a lower resistance. Pt plans to try elliptical with PT. Tossing ball between hands then in right hand and rotating ball in RUE, min-mod difficulty and v.c for amplitude.  09/12/23- Therapist checked several goals. See goals for progress.  UBE x 6 mins level 2 for conditioning, min v.c to mainatin 40 rpm, pt reports task was challenging. Standing PWR! up, rock, twist and step 10 reps each min v.c for amplitude. Pt practiced donning/ doffing hospital gown like a jacket with larger amplitude movments multiple trials, mod v.c  for RUE functional use ans pt tends to use non dominant LUE as dominant hand. Pt appears to be having difficulty with adjustment to PD diagnosis. Dynamic step and reach to copy small peg design with RUE, min v.c for amplitude, pt reports fatigue after task. Pt wrote a paragraph with 100% legibility and no significant decrease in letter size.  09/07/23 PWR! up, rock and twist in supine,  10 reps each min v.c Dynamic step and reach flipping playing cards for amplitude, min v.c Stacking coins with bilateral UE's simultaneously for increased coordination, min v.c for amplitude Education regarding community resources  08/25/23 UBE x 6 mins level 1 for conditioning, min v.c for equal use of right and left UE's Reveiwed PWR! standing basic 4, 10-20 reps each, min v.c for amplitude. ADL strategies and practice for: scrambling an egg, donning and doffing jacket with larger amplitude movements. flipping and dealing playing cards with min v.c for amplitude, min-mod difficulty with RUE today.  08/23/23- PWR ! hands followed by placing and removing grooved pegs from pegboard with larger amplitude movements Discussed safety for use of weights, specifically kettlebell, and use of controlled movements and not using too heavy weight to avoid a rotator cuff tear. Therapist demonstrated and pt returned demonstration with controlled movements for bilateral shoulder flexion with kettlebell and then unilateral use.Alinda Deem PWR! rock and twist, 10 reps each , min v.c  discussed ways pt is incorporating RUE inot dailyactivities more with big movements.  08/18/23- see pt. education section,  Bag exercises for simulated ADLS: donning socks, donning shirt, tucking in shirt, min-mod v.c for amplitude and RUE functional use.  08/16/23- Reviewed PWR! hands basic 4, 5- 10 reps each, min v.c and demonstration. Pt was instructed in coordination HEP, min-mod v.c for amplitude. Beginning discussion regarding big movements with ADL  tasks. Pt practiced opening and closing containers with larger amplitude movement strategies.   08/10/23- see pt. education Handwriting actiives with continuous"l" and education regarding positioning  07/27/23- eval     PATIENT EDUCATION: Education details:adapted strategy for donning/ doffing jacket,PWR! seated basic 4 Person educated: Patient Education method: Explanation, demonstration, v.c , handout Education comprehension: verbalized understanding, returned demonstration  HOME EXERCISE PROGRAM: PWR hands, PWR! basic 4 modified quadraped-08/10/23 handwriting with PD- 08/10/23 coordination 08/16/23 ADL strategies with big movments-08/18/23 PWR! standing, sitting GOALS: Goals reviewed with patient? Yes  SHORT TERM GOALS: Target date: 08/27/23  I with PD specific HEP  Goal status:  met, pt demonstrates understanding   2. Pt will demonstrate increased ease with dressing as evidenced by decreasing PPT#4(don/ doff jacket) to 19 secs or less Baseline: 22.42 sec Goal status:  met-9.72-pt's own jacket, 10.56 secs with hospital gown 09/21/23  3.  Pt will write a short paragraph with 100% legibility and no significant decrease in letter size Baseline, pt reports micrographia when writing a paragraph Goal status: met, 09/12/23 4.  Pt will demonstrate improved RUE functional use as evidenced by increasing box/ blocks score to 40 blocks or greater,  Goal status: met, 49 blocks 09/21/23  5.  I with adapted strategies for ADLS to maximize pt safety and I.   Goal status:met, 09/21/23   LONG TERM GOALS: Target date: 10/19/23  Pt will demonstrate improved ease with fastening buttons as evidenced by decreasing 3 button/ unbutton time to :38 secs or less  Baseline: 41.40 Goal status: INITIAL  2.   Pt will verbalize understanding of ways to prevent future PD related complications and PD community resources.        Goal status: INITIAL  3.  Pt will demonstrate improved ease with feeding as  evidenced by decreasing PPT#2 to 11 secs or less. Baseline: 14.75 Goal status: INITIAL  ASSESSMENT:  CLINICAL IMPRESSION: Pt. is progressing towards goals. Pt has now achieved all short term goals. She demonstrates improved RUE functional use.PERFORMANCE DEFICITS: in functional skills including ADLs, IADLs, coordination, dexterity, tone, ROM, flexibility, Fine  motor control, Gross motor control, mobility, balance, decreased knowledge of use of DME, and UE functional use, brafykinesia, rigidity, and psychosocial skills including coping strategies, environmental adaptation, habits, interpersonal interactions, and routines and behaviors.   IMPAIRMENTS: are limiting patient from ADLs, IADLs, rest and sleep, play, leisure, and social participation.   COMORBIDITIES:  may have co-morbidities  that affects occupational performance. Patient will benefit from skilled OT to address above impairments and improve overall function.  MODIFICATION OR ASSISTANCE TO COMPLETE EVALUATION: No modification of tasks or assist necessary to complete an evaluation.  OT OCCUPATIONAL PROFILE AND HISTORY: Detailed assessment: Review of records and additional review of physical, cognitive, psychosocial history related to current functional performance.  CLINICAL DECISION MAKING: LOW - limited treatment options, no task modification necessary  REHAB POTENTIAL: Good  EVALUATION COMPLEXITY: Low    PLAN:  OT FREQUENCY: 2x/week  OT DURATION: 12 weeks (anticipate d/c after 8 weeks dependent on progress)  PLANNED INTERVENTIONS: 13244 OT Re-evaluation, 97535 self care/ADL training, 01027 therapeutic exercise, 97530 therapeutic activity, 97112 neuromuscular re-education, 97140 manual therapy, 97035 ultrasound, 97018 paraffin, 25366 moist heat, 97010 cryotherapy, 97034 contrast bath, passive range of motion, balance training, energy conservation, coping strategies training, patient/family education, and DME and/or AE  instructions  RECOMMENDED OTHER SERVICES: PT  CONSULTED AND AGREED WITH PLAN OF CARE: Patient  PLAN FOR NEXT SESSION:  Pt is scheduled for 2 more visits, discuss adding more vs screen in 4-6 mons,  RUE functional use , work towards long term goals   Janisha Bueso, OT 09/21/2023, 12:49 PM

## 2023-09-28 ENCOUNTER — Ambulatory Visit: Payer: Medicare HMO | Admitting: Occupational Therapy

## 2023-09-28 ENCOUNTER — Encounter: Payer: Self-pay | Admitting: Physical Therapy

## 2023-09-28 ENCOUNTER — Ambulatory Visit: Payer: Medicare HMO | Admitting: Physical Therapy

## 2023-09-28 DIAGNOSIS — R29818 Other symptoms and signs involving the nervous system: Secondary | ICD-10-CM | POA: Diagnosis not present

## 2023-09-28 DIAGNOSIS — R278 Other lack of coordination: Secondary | ICD-10-CM

## 2023-09-28 DIAGNOSIS — R2689 Other abnormalities of gait and mobility: Secondary | ICD-10-CM

## 2023-09-28 DIAGNOSIS — R262 Difficulty in walking, not elsewhere classified: Secondary | ICD-10-CM

## 2023-09-28 DIAGNOSIS — G20A1 Parkinson's disease without dyskinesia, without mention of fluctuations: Secondary | ICD-10-CM

## 2023-09-28 DIAGNOSIS — R2681 Unsteadiness on feet: Secondary | ICD-10-CM

## 2023-09-28 DIAGNOSIS — M25621 Stiffness of right elbow, not elsewhere classified: Secondary | ICD-10-CM

## 2023-09-28 NOTE — Therapy (Signed)
 OUTPATIENT PHYSICAL THERAPY NEURO TREATMENT  Progress Note Reporting Period 07/27/23 to 09/28/23  See note below for Objective Data and Assessment of Progress/Goals.     Patient Name: Anita Schmidt MRN: [id number] DOB:1956/02/05, 68 y.o., female Today's Date: 09/28/2023   PCP: Tracey Harries, MD REFERRING PROVIDER: Levert Feinstein, MD   END OF SESSION:  PT End of Session - 09/28/23 1307     Visit Number 10    Date for PT Re-Evaluation 10/05/23    PT Start Time 1314    PT Stop Time 1358    PT Time Calculation (min) 44 min    Activity Tolerance Patient tolerated treatment well    Behavior During Therapy WFL for tasks assessed/performed             Past Medical History:  Diagnosis Date   Anxiety    severe   Anxiety disorder    Basal cell carcinoma 01/12/2011   BCC LEFT SIDE OF NOSE TX=MOHS   Fibrocystic breast disease    GERD (gastroesophageal reflux disease)    triggered with anxiety-not treated with meds   Gilbert's syndrome    Heart murmur    slight murmur dx by MD- no treatment-EKG clear   Hemochromatosis    High blood pressure    on meds   Hyperlipidemia    diet controlled   Skin cancer, basal cell    nose   Past Surgical History:  Procedure Laterality Date   BREAST BIOPSY Bilateral    COLONOSCOPY     colonscopy  2011   Tics ; Dr Jarold Motto   DILATION AND CURETTAGE OF UTERUS     INGUINAL HERNIA REPAIR     MOHS SURGERY  2012   nose   TONSILLECTOMY     Tracer Pellets  2003   in breast; [facility]   WISDOM TOOTH EXTRACTION     Patient Active Problem List   Diagnosis Date Noted   Hyperreflexia 05/31/2023   Parkinsonism (HCC) 05/31/2023   Right leg weakness 01/07/2021   Weakness of right hip 01/07/2021   Affective disorder (HCC) 10/18/2019   Abnormal EKG 07/03/2018   Infraspinatus strain 04/20/2018   It band syndrome, left 04/20/2018   Cough 10/27/2017   Wheezing 10/27/2017   Anxiety 10/27/2017   Hyperglycemia 04/07/2017   Insect bite 04/07/2017    Cardiac murmur 12/29/2016   Pes anserine bursitis 10/19/2016   Scapular dyskinesis 10/19/2016   Preventative health care 03/11/2016   Generalized anxiety disorder 05/24/2013   Benign essential hypertension 05/23/2013   Diverticulosis of colon without hemorrhage 04/27/2013   Malignant basal cell neoplasm of skin 04/27/2013   Hemochromatosis    Bee sting allergy 03/26/2011   IBS 02/10/2010   Anxiety state 12/25/2008   DISC DISEASE, CERVICAL 11/19/2008   Allergic rhinitis 09/19/2008   Hyperlipidemia 08/01/2008   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   MENOPAUSAL SYNDROME 08/01/2008   Personal history of disease 08/01/2008    ONSET DATE: 07/24/23  REFERRING DIAG: G20.C (ICD-10-CM) - Parkinsonism, unspecified Parkinsonism type (HCC)   THERAPY DIAG:  Other lack of coordination  Unsteadiness on feet  Other abnormalities of gait and mobility  Parkinson's disease without dyskinesia, unspecified whether manifestations fluctuate (HCC)  Difficulty in walking, not elsewhere classified  Rationale for Evaluation and Treatment: Rehabilitation  SUBJECTIVE:  SUBJECTIVE STATEMENT: Patient reports that her knee and hip have been good since the last visit, has questions regarding HEP and movements with PD  Pt accompanied by: self  PERTINENT HISTORY:  Per referring phaysican note: Letisha Yera is a 68 y.o. 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 Yes: NPRS scale: patient did not state. Pain location: R shoulder Pain description: N/A Aggravating factors: certain motions Relieving factors: avoiding the aggravating movements.  PRECAUTIONS: Fall  RED FLAGS: None   WEIGHT BEARING RESTRICTIONS: No  FALLS: Has patient fallen in last 6 months? No  LIVING ENVIRONMENT: Lives with: lives alone Lives in: House/apartment Stairs: Yes: Internal: 14 steps; on left going up and External: 4 steps; bilateral but cannot reach both Has following equipment at home: None  PLOF: Independent  PATIENT GOALS: Patient would like to identify any problems, Parkinson's education to minimize the impact on her daily life.  OBJECTIVE:  Note: Objective measures were completed at Evaluation unless otherwise noted.  DIAGNOSTIC FINDINGS:  Cerv CT 12/24 IMPRESSION: 1. No acute intracranial process. 2. No acute fracture or traumatic listhesis in the cervical spine. 3. Multilevel degenerative changes in the cervical spine, with mild-to-moderate spinal canal stenosis at C5-C6 and mild spinal canal stenosis at C6-C7. 4. Multilevel neural foraminal narrowing, severe on the right at C5-C6 and moderate bilaterally at C3-C4 and on the left at C5-C6. 5. 1.9 cm hypodense lesion in the right thyroid lobe, with areas of calcification. If this has not previously been evaluated, a non-emergent ultrasound of the thyroid is recommended. (Reference: J Am Coll Radiol. 2015 Feb;12(2): 143-50) 6. 7 mm ground-glass nodule in the right lung. Initial follow-up with CT at 6 months is recommended to confirm persistence. If persistent, repeat CT is recommended every 2 years until 5 years of stability has been established.  COGNITION: Overall cognitive status: Within functional limits for tasks assessed   SENSATION: Patient denies any change  COORDINATION: Heel on shin WNL  EDEMA:   swelling  MUSCLE TONE: no rigidity noted  MUSCLE LENGTH: Hamstrings: Right 85 deg; Left 75 deg Thomas test: Mildly tight  POSTURE: rounded shoulders and R scapula winging, patient reports spinal curvature  LOWER EXTREMITY ROM:  WNL B    LOWER EXTREMITY MMT:  5/5 BLE  BED MOBILITY:  I  TRANSFERS: I RAMP:  I  CURB:  I  STAIRS: Level of Assistance: Modified independence Stair Negotiation Technique: Step to Pattern with Single Rail on Left Number of Stairs: 14  Comments: Patient reports that she does fine on steps as long as she uses a rail.  GAIT: Gait pattern: step through pattern, decreased arm swing- Right, decreased arm swing- Left, decreased step length- Right, and decreased step length- Left Distance walked: In clinic distances Assistive device utilized: None Level of assistance: Complete Independence Comments: Patient reports that she tries to focus on longer  steps.  FUNCTIONAL TESTS:  5 times sit to stand: 11.69 Timed up and go (TUG): 8.24, cog 8.84, dual 9.37 Functional gait assessment: 24/30   09/28/23 28/30                                                                                                                             TREATMENT DATE:  09/28/23 Elliptical level 3 x 3 minutes Gait outside around the back building cues for right arm swing Reviewed HEP for stretching and for PWR moves, also reviewed walking program, gym program and things that may be good for her to do, she also goes to classes Passive stretch of the LE's  09/21/23 Elliptical 3 minutes level 3 PWR Moves in standing On bosu reaching out of BOS Step turn touch Step and reach Opposite elbow to opposite knee then hand to heel in front Supine feet on ball K2C, rotation, bridge and iso abs Passive stretch of the LE's STM with Tgun to the left hip flexor and quad  09/14/23 Gait around the back building 2 laps brisk pace, cues for right arm swing and right hand open Walking ball  toss Side step on and off airex On airex volley ball On airex 10# alternating arm pulls On airex 5# alternating punches On upside down bosu reaching, ball toss 40# resisted gait all directions Passive stretch left hip all motions  08/25/23 Ambulation outside around the back building 1 lap fast pace On bosu reaching for numbers on wall On bosu head turns On airex ball toss Step turn touch PWR moves rotation Step volley ball 4 step with cues Tmill push fwd and backward Small jumps side to side, front to back  Passive LE stretches  08/23/23 Ambulated brisk pace around the back building x 2 some cues for right arm swing Back building stairs 2 full flights step over step On bosu two ways ball toss, volley ball On wobble board balance On bosu 2 ways reaching for numbers on wall with some math Skipping, power skipping LE stretches  08/18/23 Ambulated outdoors on unlevel surfaces, up and down curbs, fast walking speed, up and down inclines, no unsteadiness noted. Alternating cone taps while standing on airex pad, progressed to multiple taps, then taps with rotation.  1 foot on stool, rolling stool side to side with rotation in SLS. 4 square stepping, first clockwise, then counter clockwise, then changing directions unexpectedly. She got caught leaning the wrong way multiple times, but was able to adjust with no LOB. Heel rise, toes raise on airex plank, able to shift through full range.  08/16/23 Bike L4 x 4 min warm up AR with 5#, 1 x 10 reps each side Ball on wall, UUE, 5x each direction, x 2 Standing on upside down BOSU, static, lateral weight shifts, ant/post weight shifts, clock face. B side step onto and off airex pad with wide steps, x 10 each direction. B side step on airex plank x 5 each  way. SLS-forward "I" with arms overhead, x 10 on each leg B side step against G tband 5 x 8 steps in each direction, band started at knees, but moved to mid shin to increase  resistance.  08/10/23:  Nustep level 5, 5 min 30 sec, LE's only, did not like the UE movement, too abrupt Reviewed and practiced the PWR moves that were provided last session.  She performed well.  We discussed performing in front of mirror to monitor her position particularly for R arm Supine for manual stretching for B hamstrings 60 sec holds, once each leg Prone for R shoulder horizontal abd palm towards floor for engaging posterior shoulder musculature Practiced gait stepping over different height boxes, initially she tended to sling her leg around the box but corrected easily with instruction.  Single leg stance, able to stand for over 30 sec each foot.  PATIENT EDUCATION: Education details: POC Person educated: Patient Education method: Explanation Education comprehension: verbalized understanding  HOME EXERCISE PROGRAM: Standing PWR moves 08/09/23  GOALS: Goals reviewed with patient? Yes  SHORT TERM GOALS: Target date: 08/16/23  I with initial HEP Baseline: Goal status: 08/15/22-Provided patient with written PWR move instructions for standing activities, met  LONG TERM GOALS: Target date: 10/05/23  I with final HEP Baseline:  Goal status: met 09/28/23  2.  Patient will score 28/30 on FGA to demonstrate decreased fall risk Baseline:  Goal status: met 09/28/23  3.  Patient will achieve 100% on M-CTSIB Baseline:  Goal status: 08/09/23-passed screen, met  4.  Patient will demonstrate normalized gait pattern while ambulating at least 800' on level and unlevel surfaces including normalized step length, no shuffling or festination. I gait Baseline:  Goal status: met 09/28/23  5.  Patient will demonstrate the ability to climb up or down 12 steps going step over step, no UE support Baseline:  Goal status: met 09/28/23  6.  Patient will identify appropriate activities and the appropriate balance of activities to address her Parkinson's with living her daily life.  Baseline:  Currently reports that her treatment activities are a bit overwhelming. Goal status: met 09/28/23  ASSESSMENT:  CLINICAL IMPRESSION: Patient reports doing well, we reviewed all HEP, gym and walking programs as well as stretching and balance and PWR moves.  She at times will get focused on all these things and it becomes overwhelming to her we discussed to find the things that she enjoys and try to focus on these and not to be so concerned about doing all of these things everyday as she thought this was what was being asked of her.  She is overall doing great , great movements, better balance, still at times will have decreased right arm swing OBJECTIVE IMPAIRMENTS: Abnormal gait, decreased activity tolerance, decreased balance, decreased coordination, decreased endurance, difficulty walking, decreased ROM, decreased strength, impaired flexibility, and impaired tone.   ACTIVITY LIMITATIONS: carrying, lifting, bending, standing, squatting, stairs, transfers, and locomotion level  PARTICIPATION LIMITATIONS: meal prep, cleaning, laundry, driving, shopping, and community activity  PERSONAL FACTORS: Past/current experiences are also affecting patient's functional outcome.   REHAB POTENTIAL: Good  CLINICAL DECISION MAKING: Evolving/moderate complexity  EVALUATION COMPLEXITY: Moderate  PLAN:  PT FREQUENCY: 2x/week  PT DURATION: 10 weeks  PLANNED INTERVENTIONS: 97110-Therapeutic exercises, 97530- Therapeutic activity, 97112- Neuromuscular re-education, 97535- Self Care, 62130- Manual therapy, [phone]- Gait training, Patient/Family education, Balance training, Dry Needling, Joint mobilization, Spinal mobilization, Cryotherapy, and Moist heat  PLAN FOR NEXT SESSION: D/C goals met  Stacie Glaze, PT 09/28/23  1:08 PM

## 2023-09-28 NOTE — Therapy (Signed)
 OUTPATIENT OCCUPATIONAL THERAPY PARKINSON'S treatment  Patient Name: Anita Schmidt MRN: [id number] DOB:1955/08/09, 68 y.o., female Today's Date: 09/28/2023  PCP: Dr. Everlene Other REFERRING PROVIDER: Dr. Terrace Arabia  END OF SESSION:  OT End of Session - 09/28/23 1329     Visit Number 9    Number of Visits 25    Date for OT Re-Evaluation 10/19/23    Authorization Time Period 12 weeks    Progress Note Due on Visit 10    OT Start Time 1233    OT Stop Time 1313    OT Time Calculation (min) 40 min    Activity Tolerance Patient tolerated treatment well    Behavior During Therapy WFL for tasks assessed/performed                  Past Medical History:  Diagnosis Date   Anxiety    severe   Anxiety disorder    Basal cell carcinoma 01/12/2011   BCC LEFT SIDE OF NOSE TX=MOHS   Fibrocystic breast disease    GERD (gastroesophageal reflux disease)    triggered with anxiety-not treated with meds   Gilbert's syndrome    Heart murmur    slight murmur dx by MD- no treatment-EKG clear   Hemochromatosis    High blood pressure    on meds   Hyperlipidemia    diet controlled   Skin cancer, basal cell    nose   Past Surgical History:  Procedure Laterality Date   BREAST BIOPSY Bilateral    COLONOSCOPY     colonscopy  2011   Tics ; Dr Jarold Motto   DILATION AND CURETTAGE OF UTERUS     INGUINAL HERNIA REPAIR     MOHS SURGERY  2012   nose   TONSILLECTOMY     Tracer Pellets  2003   in breast; [facility]   WISDOM TOOTH EXTRACTION     Patient Active Problem List   Diagnosis Date Noted   Hyperreflexia 05/31/2023   Parkinsonism (HCC) 05/31/2023   Right leg weakness 01/07/2021   Weakness of right hip 01/07/2021   Affective disorder (HCC) 10/18/2019   Abnormal EKG 07/03/2018   Infraspinatus strain 04/20/2018   It band syndrome, left 04/20/2018   Cough 10/27/2017   Wheezing 10/27/2017   Anxiety 10/27/2017   Hyperglycemia 04/07/2017   Insect bite 04/07/2017   Cardiac murmur 12/29/2016    Pes anserine bursitis 10/19/2016   Scapular dyskinesis 10/19/2016   Preventative health care 03/11/2016   Generalized anxiety disorder 05/24/2013   Benign essential hypertension 05/23/2013   Diverticulosis of colon without hemorrhage 04/27/2013   Malignant basal cell neoplasm of skin 04/27/2013   Hemochromatosis    Bee sting allergy 03/26/2011   IBS 02/10/2010   Anxiety state 12/25/2008   DISC DISEASE, CERVICAL 11/19/2008   Allergic rhinitis 09/19/2008   Hyperlipidemia 08/01/2008   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   MENOPAUSAL SYNDROME 08/01/2008   Personal history of disease 08/01/2008    ONSET DATE: 07/04/23  REFERRING DIAG:  Diagnosis  G20.C (ICD-10-CM) - Parkinsonism, unspecified Parkinsonism type (HCC)    THERAPY DIAG:  Other symptoms and signs involving the nervous system  Other lack of coordination  Unsteadiness on feet  Stiffness of right elbow, not elsewhere classified  Other abnormalities of gait and mobility  Rationale for Evaluation and Treatment: Rehabilitation  SUBJECTIVE:   SUBJECTIVE STATEMENT: Pt reports she is quite sleepy today Pt accompanied by: self  PERTINENT HISTORY:  Parkinsonism, recently diagnosed  November 2024, however symptomatic with decreased  arm swing for several years. PMHx of HTN, ostoporosis, anxiety    PRECAUTIONS: Fall  WEIGHT BEARING RESTRICTIONS: No  PAIN:  Are you having pain? No  FALLS: Has patient fallen in last 6 months? No  LIVING ENVIRONMENT: Lives with: lives alone Lives in: House/apartment Stairs: Yes: Internal  PLOF: Independent  PATIENT GOALS: maintain independence  OBJECTIVE:  Note: Objective measures were completed at Evaluation unless otherwise noted.  HAND DOMINANCE: Right  ADLs: Overall ADLs: mod I with all basic ADLs. Transfers/ambulation related to ADLs: Eating: mod I Grooming: mod I UB Dressing: mod I LB Dressing: mod I Toileting: mod  Bathing: mod I Tub Shower  transfers: mod I   IADLs:Pt reports she is mod I with all home management S Handwriting: 100% legible, however pt reports micrographia as she writes  for longer time period.  MOBILITY STATUS: Independent     FUNCTIONAL OUTCOME MEASURES: Fastening/unfastening 3 buttons: 41.40 Physical performance test: PPT#2 (simulated eating) 14.75 secs  & PPT#4 (donning/doffing jacket): 22.42 secs  COORDINATION: 9 Hole Peg test: Right: 30.76 sec; Left: 32.94 sec Box and Blocks:  Right 36 blocks, Left 50 blocks  UE ROM:   shoulder flexion RUE 140, LUE 140, Elbow extension RUE -10, LUE -5    SENSATION: WFL  MUSCLE TONE: RUE: Rigidity  COGNITION: Overall cognitive status: Within functional limits for tasks assessed  OBSERVATIONS: Bradykinesia                                                                                                                    TREATMENT DATE:09/28/23- UBE x 6 mins level 1  PWR! up and rock in standing min v.c for amplitude PWR! hands for PWR! up and step  followed by dynamic step and reach to place ones on table then back in cabinets min v.c for amplitude then stacking blocks with left and right UE's simultaneously min v.c for amplitude and release. Fastening/ unfastening buttons with adapted strategies, min v.c and min difficulty   09/21/23-UBE x6 mins level 1 for conditioning. PWR! up, rock and twist in seated pt attempted seated step however this aggravated her left knee. PWR! hands for PWR! up, rock and twist 5-10 reps each with min v.c  Donning /doffing jacket with adapted strategies, min v.c initally, pt practiced several time swith good sucess. Therapist continued checking short term goals. Therapist discussed safe exercise with patient and the importance of avoiding secondary injury to shoulder and LE's. Therapist recommends arm bike, she can row but she should perform at a lower resistance. Pt plans to try elliptical with PT. Tossing ball between hands  then in right hand and rotating ball in RUE, min-mod difficulty and v.c for amplitude.  09/12/23- Therapist checked several goals. See goals for progress.  UBE x 6 mins level 2 for conditioning, min v.c to mainatin 40 rpm, pt reports task was challenging. Standing PWR! up, rock, twist and step 10 reps each min v.c for amplitude. Pt practiced donning/ doffing hospital gown like a jacket with  larger amplitude movments multiple trials, mod v.c for RUE functional use ans pt tends to use non dominant LUE as dominant hand. Pt appears to be having difficulty with adjustment to PD diagnosis. Dynamic step and reach to copy small peg design with RUE, min v.c for amplitude, pt reports fatigue after task. Pt wrote a paragraph with 100% legibility and no significant decrease in letter size.  09/07/23 PWR! up, rock and twist in supine, 10 reps each min v.c Dynamic step and reach flipping playing cards for amplitude, min v.c Stacking coins with bilateral UE's simultaneously for increased coordination, min v.c for amplitude Education regarding community resources  08/25/23 UBE x 6 mins level 1 for conditioning, min v.c for equal use of right and left UE's Reveiwed PWR! standing basic 4, 10-20 reps each, min v.c for amplitude. ADL strategies and practice for: scrambling an egg, donning and doffing jacket with larger amplitude movements. flipping and dealing playing cards with min v.c for amplitude, min-mod difficulty with RUE today.  08/23/23- PWR ! hands followed by placing and removing grooved pegs from pegboard with larger amplitude movements Discussed safety for use of weights, specifically kettlebell, and use of controlled movements and not using too heavy weight to avoid a rotator cuff tear. Therapist demonstrated and pt returned demonstration with controlled movements for bilateral shoulder flexion with kettlebell and then unilateral use.Alinda Deem PWR! rock and twist, 10 reps each , min v.c  discussed ways  pt is incorporating RUE inot dailyactivities more with big movements.  08/18/23- see pt. education section,  Bag exercises for simulated ADLS: donning socks, donning shirt, tucking in shirt, min-mod v.c for amplitude and RUE functional use.  08/16/23- Reviewed PWR! hands basic 4, 5- 10 reps each, min v.c and demonstration. Pt was instructed in coordination HEP, min-mod v.c for amplitude. Beginning discussion regarding big movements with ADL tasks. Pt practiced opening and closing containers with larger amplitude movement strategies.   08/10/23- see pt. education Handwriting actiives with continuous"l" and education regarding positioning  07/27/23- eval     PATIENT EDUCATION: Education details:09/28/23-fastening buttons with adapted strategy, ways to prevent future complications Person educated: Patient Education method: Explanation, demonstration, v.c , handout Education comprehension: verbalized understanding, returned demonstration  HOME EXERCISE PROGRAM: PWR hands, PWR! basic 4 modified quadraped-08/10/23 handwriting with PD- 08/10/23 coordination 08/16/23 ADL strategies with big movments-08/18/23 PWR! standing, sitting GOALS: Goals reviewed with patient? Yes  SHORT TERM GOALS: Target date: 08/27/23  I with PD specific HEP  Goal status:  met, pt demonstrates understanding   2. Pt will demonstrate increased ease with dressing as evidenced by decreasing PPT#4(don/ doff jacket) to 19 secs or less Baseline: 22.42 sec Goal status:  met-9.72-pt's own jacket, 10.56 secs with hospital gown 09/21/23  3.  Pt will write a short paragraph with 100% legibility and no significant decrease in letter size Baseline, pt reports micrographia when writing a paragraph Goal status: met, 09/12/23 4.  Pt will demonstrate improved RUE functional use as evidenced by increasing box/ blocks score to 40 blocks or greater,  Goal status: met, 49 blocks 09/21/23  5.  I with adapted strategies for ADLS to maximize  pt safety and I.   Goal status:met, 09/21/23   LONG TERM GOALS: Target date: 10/19/23  Pt will demonstrate improved ease with fastening buttons as evidenced by decreasing 3 button/ unbutton time to :38 secs or less  Baseline: 41.40 Goal status:  ongoing   2.   Pt will verbalize understanding of ways to prevent future  PD related complications and PD community resources.        Goal status: met, community resources, PD complications handouts issued  09/28/23  3.  Pt will demonstrate improved ease with feeding as evidenced by decreasing PPT#2 to 11 secs or less. Baseline: 14.75 Goal status: INITIAL  ASSESSMENT:  CLINICAL IMPRESSION: Pt. is progressing towards goals. Pt has now achieved all short term goals. She demonstrates improved RUE functional use.PERFORMANCE DEFICITS: in functional skills including ADLs, IADLs, coordination, dexterity, tone, ROM, flexibility, Fine motor control, Gross motor control, mobility, balance, decreased knowledge of use of DME, and UE functional use, brafykinesia, rigidity, and psychosocial skills including coping strategies, environmental adaptation, habits, interpersonal interactions, and routines and behaviors.   IMPAIRMENTS: are limiting patient from ADLs, IADLs, rest and sleep, play, leisure, and social participation.   COMORBIDITIES:  may have co-morbidities  that affects occupational performance. Patient will benefit from skilled OT to address above impairments and improve overall function.  MODIFICATION OR ASSISTANCE TO COMPLETE EVALUATION: No modification of tasks or assist necessary to complete an evaluation.  OT OCCUPATIONAL PROFILE AND HISTORY: Detailed assessment: Review of records and additional review of physical, cognitive, psychosocial history related to current functional performance.  CLINICAL DECISION MAKING: LOW - limited treatment options, no task modification necessary  REHAB POTENTIAL: Good  EVALUATION COMPLEXITY:  Low    PLAN:  OT FREQUENCY: 2x/week  OT DURATION: 12 weeks (anticipate d/c after 8 weeks dependent on progress)  PLANNED INTERVENTIONS: 96045 OT Re-evaluation, 97535 self care/ADL training, 40981 therapeutic exercise, 97530 therapeutic activity, 97112 neuromuscular re-education, 97140 manual therapy, 97035 ultrasound, 97018 paraffin, 19147 moist heat, 97010 cryotherapy, 97034 contrast bath, passive range of motion, balance training, energy conservation, coping strategies training, patient/family education, and DME and/or AE instructions  RECOMMENDED OTHER SERVICES: PT  CONSULTED AND AGREED WITH PLAN OF CARE: Patient  PLAN FOR NEXT SESSION:    RUE functional use , work towards long term goals   Cori Justus, OT 09/28/2023, 1:30 PM

## 2023-09-28 NOTE — Patient Instructions (Addendum)
 Ways to prevent future Parkinson's related complications:  1.   Exercise regularly/daily.    2.   Focus on BIGGER movements during daily activities- really reach overhead, straighten elbows and extend fingers  3.   When dressing (especially jacket/coat) or reaching for your seatbelt make sure to use your body to assist by twisting while you reach and looking at where you are reaching - this can help to minimize stress on the shoulder and reduce the risk of a rotator cuff tear  4.   Swing your arms when you walk (unless using walker)! People with PD are at increased risk for frozen shoulder and swinging your arms can reduce this risk.  5. Drink plenty of water and eat a high fiber diet (fresh fruits and veggies, nuts/seeds, fish). Avoid canned foods, red meats, and dairy when possible  6. Keep your feet apart when you are standing (wider stance) to allow you to have better balance and to reach further with your arms. Also make sure your feet are apart before standing up.

## 2023-10-05 ENCOUNTER — Encounter: Payer: Medicare HMO | Admitting: Occupational Therapy

## 2023-10-18 ENCOUNTER — Encounter: Payer: Self-pay | Admitting: Occupational Therapy

## 2023-10-18 ENCOUNTER — Ambulatory Visit: Attending: Neurology | Admitting: Occupational Therapy

## 2023-10-18 DIAGNOSIS — R2689 Other abnormalities of gait and mobility: Secondary | ICD-10-CM | POA: Insufficient documentation

## 2023-10-18 DIAGNOSIS — R2681 Unsteadiness on feet: Secondary | ICD-10-CM | POA: Diagnosis present

## 2023-10-18 DIAGNOSIS — R278 Other lack of coordination: Secondary | ICD-10-CM | POA: Diagnosis present

## 2023-10-18 DIAGNOSIS — M25621 Stiffness of right elbow, not elsewhere classified: Secondary | ICD-10-CM | POA: Insufficient documentation

## 2023-10-18 DIAGNOSIS — R29818 Other symptoms and signs involving the nervous system: Secondary | ICD-10-CM | POA: Diagnosis present

## 2023-10-18 NOTE — Therapy (Unsigned)
 OUTPATIENT OCCUPATIONAL THERAPY [facility] treatment  Patient Name: Anita Schmidt MRN: [id number] DOB:03-Mar-1956, 68 y.o., female Today's Date: 10/19/2023  PCP: Dr. Everlene Other REFERRING PROVIDER: Dr. Terrace Arabia OCCUPATIONAL THERAPY DISCHARGE SUMMARY    Current functional level related to goals / functional outcomes: Pt made progress towards goals, she did not fully achieve all goals due to bradykinesia . see goals below   Remaining deficits: rigidity, decreased coordination, decreased RUE functional use, bradykinesia   Education / Equipment: Pt was provided with   Patient agrees to discharge. Patient goals were partially met. Patient is being discharged due to being pleased with the current functional level. Pt agrees with plans for PD screen in 3-4 months.    END OF SESSION:  OT End of Session - 10/19/23 1014     Visit Number 10    Number of Visits 25    Date for OT Re-Evaluation 10/19/23    Authorization Time Period 12 weeks    Authorization - Visit Number 10   corrected count   Progress Note Due on Visit 10    OT Start Time 1104    OT Stop Time 1145    OT Time Calculation (min) 41 min    Activity Tolerance Patient tolerated treatment well    Behavior During Therapy WFL for tasks assessed/performed                   Past Medical History:  Diagnosis Date   Anxiety    severe   Anxiety disorder    Basal cell carcinoma 01/12/2011   BCC LEFT SIDE OF NOSE TX=MOHS   Fibrocystic breast disease    GERD (gastroesophageal reflux disease)    triggered with anxiety-not treated with meds   Gilbert's syndrome    Heart murmur    slight murmur dx by MD- no treatment-EKG clear   Hemochromatosis    High blood pressure    on meds   Hyperlipidemia    diet controlled   Skin cancer, basal cell    nose   Past Surgical History:  Procedure Laterality Date   BREAST BIOPSY Bilateral    COLONOSCOPY     colonscopy  2011   Tics ; Dr Jarold Motto   DILATION AND CURETTAGE OF  UTERUS     INGUINAL HERNIA REPAIR     MOHS SURGERY  2012   nose   TONSILLECTOMY     Tracer Pellets  2003   in breast; [facility]   WISDOM TOOTH EXTRACTION     Patient Active Problem List   Diagnosis Date Noted   Hyperreflexia 05/31/2023   Parkinsonism (HCC) 05/31/2023   Right leg weakness 01/07/2021   Weakness of right hip 01/07/2021   Affective disorder (HCC) 10/18/2019   Abnormal EKG 07/03/2018   Infraspinatus strain 04/20/2018   It band syndrome, left 04/20/2018   Cough 10/27/2017   Wheezing 10/27/2017   Anxiety 10/27/2017   Hyperglycemia 04/07/2017   Insect bite 04/07/2017   Cardiac murmur 12/29/2016   Pes anserine bursitis 10/19/2016   Scapular dyskinesis 10/19/2016   Preventative health care 03/11/2016   Generalized anxiety disorder 05/24/2013   Benign essential hypertension 05/23/2013   Diverticulosis of colon without hemorrhage 04/27/2013   Malignant basal cell neoplasm of skin 04/27/2013   Hemochromatosis    Bee sting allergy 03/26/2011   IBS 02/10/2010   Anxiety state 12/25/2008   DISC DISEASE, CERVICAL 11/19/2008   Allergic rhinitis 09/19/2008   Hyperlipidemia 08/01/2008   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   MENOPAUSAL SYNDROME  08/01/2008   Personal history of disease 08/01/2008    ONSET DATE: 07/04/23  REFERRING DIAG:  Diagnosis  G20.C (ICD-10-CM) - Parkinsonism, unspecified Parkinsonism type (HCC)    THERAPY DIAG:  Other symptoms and signs involving the nervous system  Other lack of coordination  Unsteadiness on feet  Stiffness of right elbow, not elsewhere classified  Other abnormalities of gait and mobility  Rationale for Evaluation and Treatment: Rehabilitation  SUBJECTIVE:   SUBJECTIVE STATEMENT: Pt agrees with plans for d/c today Pt accompanied by: self  PERTINENT HISTORY:  Parkinsonism, recently diagnosed  November 2024, however symptomatic with decreased arm swing for several years. PMHx of HTN, ostoporosis,  anxiety    PRECAUTIONS: Fall  WEIGHT BEARING RESTRICTIONS: No  PAIN:  Are you having pain? No  FALLS: Has patient fallen in last 6 months? No  LIVING ENVIRONMENT: Lives with: lives alone Lives in: House/apartment Stairs: Yes: Internal  PLOF: Independent  PATIENT GOALS: maintain independence  OBJECTIVE:  Note: Objective measures were completed at Evaluation unless otherwise noted.  HAND DOMINANCE: Right  ADLs: Overall ADLs: mod I with all basic ADLs. Transfers/ambulation related to ADLs: Eating: mod I Grooming: mod I UB Dressing: mod I LB Dressing: mod I Toileting: mod  Bathing: mod I Tub Shower transfers: mod I   IADLs:Pt reports she is mod I with all home management S Handwriting: 100% legible, however pt reports micrographia as she writes  for longer time period.  MOBILITY STATUS: Independent     FUNCTIONAL OUTCOME MEASURES: Fastening/unfastening 3 buttons: 41.40 Physical performance test: PPT#2 (simulated eating) 14.75 secs  & PPT#4 (donning/doffing jacket): 22.42 secs  COORDINATION: 9 Hole Peg test: Right: 30.76 sec; Left: 32.94 sec Box and Blocks:  Right 36 blocks, Left 50 blocks  UE ROM:   shoulder flexion RUE 140, LUE 140, Elbow extension RUE -10, LUE -5    SENSATION: WFL  MUSCLE TONE: RUE: Rigidity  COGNITION: Overall cognitive status: Within functional limits for tasks assessed  OBSERVATIONS: Bradykinesia                                                                                                                    TREATMENT DATE:10/18/23- checked progress towards goals, see progress below and discussed plans for a PD screen in 3-4 months. Pt in agreement. PWR! hands for PWR! up and step, dealing cards with larger amplitude movements, min v.c Discussed balancing activity and rest as pt appears to over schedule herself for exercise at times. Exercise flowsheet provided and use was reviewed.    09/28/23- UBE x 6 mins level 1  PWR! up  and rock in standing min v.c for amplitude PWR! hands for PWR! up and step  followed by dynamic step and reach to place ones on table then back in cabinets min v.c for amplitude then stacking blocks with left and right UE's simultaneously min v.c for amplitude and release. Fastening/ unfastening buttons with adapted strategies, min v.c and min difficulty   09/21/23-UBE x6 mins level 1 for conditioning.  PWR! up, rock and twist in seated pt attempted seated step however this aggravated her left knee. PWR! hands for PWR! up, rock and twist 5-10 reps each with min v.c  Donning /doffing jacket with adapted strategies, min v.c initally, pt practiced several time swith good sucess. Therapist continued checking short term goals. Therapist discussed safe exercise with patient and the importance of avoiding secondary injury to shoulder and LE's. Therapist recommends arm bike, she can row but she should perform at a lower resistance. Pt plans to try elliptical with PT. Tossing ball between hands then in right hand and rotating ball in RUE, min-mod difficulty and v.c for amplitude.  09/12/23- Therapist checked several goals. See goals for progress.  UBE x 6 mins level 2 for conditioning, min v.c to mainatin 40 rpm, pt reports task was challenging. Standing PWR! up, rock, twist and step 10 reps each min v.c for amplitude. Pt practiced donning/ doffing hospital gown like a jacket with larger amplitude movments multiple trials, mod v.c for RUE functional use ans pt tends to use non dominant LUE as dominant hand. Pt appears to be having difficulty with adjustment to PD diagnosis. Dynamic step and reach to copy small peg design with RUE, min v.c for amplitude, pt reports fatigue after task. Pt wrote a paragraph with 100% legibility and no significant decrease in letter size.  09/07/23 PWR! up, rock and twist in supine, 10 reps each min v.c Dynamic step and reach flipping playing cards for amplitude, min  v.c Stacking coins with bilateral UE's simultaneously for increased coordination, min v.c for amplitude Education regarding community resources  08/25/23 UBE x 6 mins level 1 for conditioning, min v.c for equal use of right and left UE's Reveiwed PWR! standing basic 4, 10-20 reps each, min v.c for amplitude. ADL strategies and practice for: scrambling an egg, donning and doffing jacket with larger amplitude movements. flipping and dealing playing cards with min v.c for amplitude, min-mod difficulty with RUE today.  08/23/23- PWR ! hands followed by placing and removing grooved pegs from pegboard with larger amplitude movements Discussed safety for use of weights, specifically kettlebell, and use of controlled movements and not using too heavy weight to avoid a rotator cuff tear. Therapist demonstrated and pt returned demonstration with controlled movements for bilateral shoulder flexion with kettlebell and then unilateral use.Alinda Deem PWR! rock and twist, 10 reps each , min v.c  discussed ways pt is incorporating RUE inot dailyactivities more with big movements.  08/18/23- see pt. education section,  Bag exercises for simulated ADLS: donning socks, donning shirt, tucking in shirt, min-mod v.c for amplitude and RUE functional use.  08/16/23- Reviewed PWR! hands basic 4, 5- 10 reps each, min v.c and demonstration. Pt was instructed in coordination HEP, min-mod v.c for amplitude. Beginning discussion regarding big movements with ADL tasks. Pt practiced opening and closing containers with larger amplitude movement strategies.   08/10/23- see pt. education Handwriting actiives with continuous"l" and education regarding positioning  07/27/23- eval     PATIENT EDUCATION: Education details: Reviewed fastening buttons with adapted strategy,  progress towards goals, importance of forcing to use of her RUE during daily activities. PD screen in 6 months Person educated: Patient Education method:  Explanation, Education comprehension: verbalized understanding,  HOME EXERCISE PROGRAM: PWR hands, PWR! basic 4 modified quadraped-08/10/23 handwriting with PD- 08/10/23 coordination 08/16/23 ADL strategies with big movments-08/18/23 PWR! standing, sitting GOALS: Goals reviewed with patient? Yes  SHORT TERM GOALS: Target date: 08/27/23  I with PD  specific HEP  Goal status:  met, pt demonstrates understanding   2. Pt will demonstrate increased ease with dressing as evidenced by decreasing PPT#4(don/ doff jacket) to 19 secs or less Baseline: 22.42 sec Goal status:  met-9.72-pt's own jacket, 10.56 secs with hospital gown 09/21/23  3.  Pt will write a short paragraph with 100% legibility and no significant decrease in letter size Baseline, pt reports micrographia when writing a paragraph Goal status: met, 09/12/23 4.  Pt will demonstrate improved RUE functional use as evidenced by increasing box/ blocks score to 40 blocks or greater,  Goal status: met, 49 blocks 09/21/23  5.  I with adapted strategies for ADLS to maximize pt safety and I.   Goal status:met, 09/21/23   LONG TERM GOALS: Target date: 10/19/23  Pt will demonstrate improved ease with fastening buttons as evidenced by decreasing 3 button/ unbutton time to :38 secs or less  Baseline: 41.40 Goal status:  not met, 49.51 secs  2.   Pt will verbalize understanding of ways to prevent future PD related complications and PD community resources.        Goal status: met, community resources, PD complications handouts issued  09/28/23  3.  Pt will demonstrate improved ease with feeding as evidenced by decreasing PPT#2 to 11 secs or less. Baseline: 14.75 Goal status: not met 14.60   ASSESSMENT:  CLINICAL IMPRESSION: For the reporting period of :07/27/23- 10/18/23 Pt made good overall progress. She achieved all short term goals and 2/3 long term goals. Pt has made good overall progress and she agrees with plans for d/c.  PERFORMANCE  DEFICITS: in functional skills including ADLs, IADLs, coordination, dexterity, tone, ROM, flexibility, Fine motor control, Gross motor control, mobility, balance, decreased knowledge of use of DME, and UE functional use, brafykinesia, rigidity, and psychosocial skills including coping strategies, environmental adaptation, habits, interpersonal interactions, and routines and behaviors.   IMPAIRMENTS: are limiting patient from ADLs, IADLs, rest and sleep, play, leisure, and social participation.   COMORBIDITIES:  may have co-morbidities  that affects occupational performance. Patient will benefit from skilled OT to address above impairments and improve overall function.  MODIFICATION OR ASSISTANCE TO COMPLETE EVALUATION: No modification of tasks or assist necessary to complete an evaluation.  OT OCCUPATIONAL PROFILE AND HISTORY: Detailed assessment: Review of records and additional review of physical, cognitive, psychosocial history related to current functional performance.  CLINICAL DECISION MAKING: LOW - limited treatment options, no task modification necessary  REHAB POTENTIAL: Good  EVALUATION COMPLEXITY: Low    PLAN:  OT FREQUENCY: 2x/week  OT DURATION: 12 weeks (anticipate d/c after 8 weeks dependent on progress)  PLANNED INTERVENTIONS: 65784 OT Re-evaluation, 97535 self care/ADL training, 69629 therapeutic exercise, 97530 therapeutic activity, 97112 neuromuscular re-education, 97140 manual therapy, 97035 ultrasound, 97018 paraffin, 52841 moist heat, 97010 cryotherapy, 97034 contrast bath, passive range of motion, balance training, energy conservation, coping strategies training, patient/family education, and DME and/or AE instructions  RECOMMENDED OTHER SERVICES: PT  CONSULTED AND AGREED WITH PLAN OF CARE: Patient  PLAN FOR NEXT SESSION:     d/c OT, PD screen in 3-4 months   Calen Posch, OT 10/19/2023, 10:15 AM

## 2023-10-18 NOTE — Patient Instructions (Signed)
(  Exercise) Monday Tuesday Wednesday Thursday Friday Saturday Sunday   PWR!hands           PWR! standing           PWR! sitting            yoga           cycle           walking

## 2023-11-28 ENCOUNTER — Encounter: Payer: Self-pay | Admitting: Neurology

## 2023-11-28 ENCOUNTER — Ambulatory Visit: Payer: Medicare HMO | Admitting: Neurology

## 2023-11-28 VITALS — BP 167/80 | HR 65 | Ht 66.0 in | Wt 133.5 lb

## 2023-11-28 DIAGNOSIS — G20A1 Parkinson's disease without dyskinesia, without mention of fluctuations: Secondary | ICD-10-CM | POA: Diagnosis not present

## 2023-11-28 MED ORDER — RASAGILINE MESYLATE 0.5 MG PO TABS: 0.5000 mg | ORAL_TABLET | Freq: Every day | ORAL | 3 refills | Status: AC

## 2023-11-28 NOTE — Progress Notes (Signed)
 Chief Complaint  Patient presents with   Tremors    Rm12, alone, Parkinsonism: doing about the same as last visit, no concerns   ASSESSMENT AND PLAN  Anita Schmidt is a 68 y.o. female   Idiopathic Parkinson's disease  Involving right more than left, mild  CT head showed no significant abnormality  Laboratory evaluation including thyroid  functional test, were normal  She tolerated Azilect  0.5 mg daily well, physically active, does not want to go on higher dose,   Encouraged her to continue moderate exercise,  Return To Clinic With NP In 6 Months treatment options are higher dose of Azilect  1 mg daily, add on low-dose dopamine agonist DIAGNOSTIC DATA (LABS, IMAGING, TESTING) - I reviewed patient records, labs, notes, testing and imaging myself where available.   MEDICAL HISTORY:  Anita Schmidt, is a 68 year old female, seen in request by primary care physician Dr. Alfredia Ina for evaluation of parkinsonism, initial evaluation was on May 31 2023  History is obtained from the patient and review of electronic medical records. I personally reviewed pertinent available imaging films in PACS.   PMHx of  HTN Osteoporosis Anxiety  She lives alone, used to be very physically active, last couple years, she noticed that her gait was getting sloppy, does not swing her right arm, clumsiness of the right hand,  She also complains loss sense of smell, sleeps well, denies orthostatic hypotension, constipation,  She has a family history of Parkinson's disease, worried about the diagnosis, positive from her evaluation   UPDATE Nov 28 2023: She has physical therapy, which has been very helpful, she is physically active, attending spin class, yoga regularly, occasionally right hand tremor, slowness, no limitation in her daily function,  She tolerating Azilect  0.5 mg daily well, intermittent anxiety, but does not want more medications,  CT head without contrast November 2024 was  normal  CT cervical spine showed mild multilevel degenerative changes, no significant canal stenosis, noticeable facet and uncovertebral hypertrophy at C2-3, C3-4, C4-5,  PHYSICAL EXAM:   Vitals:   11/28/23 1005 11/28/23 1009  BP: (!) 146/87 (!) 167/80  Pulse:  65  Weight:  133 lb 8 oz (60.6 kg)  Height:  5\' 6"  (1.676 m)   Body mass index is 21.55 kg/m.  PHYSICAL EXAMNIATION:  Gen: NAD, conversant, well nourised, well groomed                     Cardiovascular: Regular rate rhythm, no peripheral edema, warm, nontender. Eyes: Conjunctivae clear without exudates or hemorrhage Neck: Supple, no carotid bruits. Pulmonary: Clear to auscultation bilaterally   NEUROLOGICAL EXAM:  MENTAL STATUS: Speech/cognition:  Awake, alert, oriented to history taking and casual conversation CRANIAL NERVES: CN II: Visual fields are full to confrontation. Pupils are round equal and briskly reactive to light. CN III, IV, VI: extraocular movement are normal. No ptosis. CN V: Facial sensation is intact to light touch CN VII: Face is symmetric with normal eye closure  CN VIII: Hearing is normal to causal conversation. CN IX, X: Phonation is normal. CN XI: Head turning and shoulder shrug are intact  MOTOR: mild right more than left rigidity, bradykinesia  REFLEXES: Reflexes are 2+ and symmetric at the biceps, triceps, knees, and ankles. Plantar responses are extensor bilaterally  SENSORY: Intact to light touch, pinprick and vibratory sensation are intact in fingers and toes.  COORDINATION: There is no trunk or limb dysmetria noted.  GAIT/STANCE:  mildly decreased swing right arm, moderate stride,  smooth turning  REVIEW OF SYSTEMS:  Full 14 system review of systems performed and notable only for as above All other review of systems were negative.   ALLERGIES: Allergies  Allergen Reactions   Latex Itching    Redness & itching Because of a history of documented adverse serious drug  reaction;Medi Alert bracelet  is recommended   Epinephrine     ? jittery   Effexor  [Venlafaxine ]     Shaking, elevated BP,full blown panic attack  Hypertension with systolic blood pressure 210 with severe nausea   Amoxicillin     REACTION: GI UPSET   Citalopram     ? nausea    HOME MEDICATIONS: Current Outpatient Medications  Medication Sig Dispense Refill   amLODipine  (NORVASC ) 2.5 MG tablet Take 1 tablet (2.5 mg total) by mouth daily. Overdue for Annual appt w/last must see provider for future refills 30 tablet 0   Cholecalciferol (VITAMIN D3) 25 MCG (1000 UT) CAPS Take 1 capsule by mouth daily.     Cyanocobalamin  (VITAMIN B 12 PO) Take 1,000 Units by mouth daily.     denosumab (PROLIA) 60 MG/ML SOSY injection      rasagiline  (AZILECT ) 0.5 MG TABS tablet TAKE 1 TABLET(0.5 MG) BY MOUTH DAILY 30 tablet 11   No current facility-administered medications for this visit.    PAST MEDICAL HISTORY: Past Medical History:  Diagnosis Date   Anxiety    severe   Anxiety disorder    Basal cell carcinoma 01/12/2011   BCC LEFT SIDE OF NOSE TX=MOHS   Fibrocystic breast disease    GERD (gastroesophageal reflux disease)    triggered with anxiety-not treated with meds   Gilbert's syndrome    Heart murmur    slight murmur dx by MD- no treatment-EKG clear   Hemochromatosis    High blood pressure    on meds   Hyperlipidemia    diet controlled   Skin cancer, basal cell    nose    PAST SURGICAL HISTORY: Past Surgical History:  Procedure Laterality Date   BREAST BIOPSY Bilateral    COLONOSCOPY     colonscopy  2011   Tics ; Dr Adan Holms   DILATION AND CURETTAGE OF UTERUS     INGUINAL HERNIA REPAIR     MOHS SURGERY  2012   nose   TONSILLECTOMY     Tracer Pellets  2003   in breast; [facility]   WISDOM TOOTH EXTRACTION      FAMILY HISTORY: Family History  Problem Relation Age of Onset   Hypertension Mother        PMH   Alcohol abuse Mother    Parkinson's disease Mother    Anxiety  disorder Brother    Depression Brother    Alzheimer's disease Maternal Grandmother    Hypertension Maternal Grandmother    Stroke Paternal Uncle    Alzheimer's disease Paternal Uncle    Diabetes Neg Hx    Colon cancer Neg Hx    Esophageal cancer Neg Hx    Colon polyps Neg Hx    Rectal cancer Neg Hx    Stomach cancer Neg Hx     SOCIAL HISTORY: Social History   Socioeconomic History   Marital status: Single    Spouse name: Not on file   Number of children: Not on file   Years of education: Not on file   Highest education level: Not on file  Occupational History   Occupation: TEXTILE DESIGNER    Employer: [employer]  Tobacco Use  Smoking status: Never   Smokeless tobacco: Never  Vaping Use   Vaping status: Never Used  Substance and Sexual Activity   Alcohol use: Not Currently   Drug use: No   Sexual activity: Not Currently    Partners: Male    Birth control/protection: Post-menopausal    Comment: less than 5, IC after 16, no abnormal pap, no STD, no DES exposure  Other Topics Concern   Not on file  Social History Narrative   GETS REGULAR EXERCISE         Diet: No Beef      Do you drink/ eat things with caffeine? Sometimes      Marital status:   Single                            What year were you married ?       Do you live in a house, apartment,assistred living, condo, trailer, etc.)? Townhouse      Is it one or more stories? 2      How many persons live in your home ? 1      Do you have any pets in your home ?(please list)  No      Highest Level of education completed: Masters Degree      Current or past profession:       Do you exercise?     Yes                         Type & how often   Yoga, Walking      ADVANCED DIRECTIVES (Please bring copies)      Do you have a living will? Yes      Do you have a DNR form? Yes                     If not, do you want to discuss one?       Do you have signed POA?HPOA forms?    Yes             If so, please bring to  your appointment      FUNCTIONAL STATUS- To be completed by Spouse / child / Staff       Do you have difficulty bathing or dressing yourself ?      Do you have difficulty preparing food or eating ?      Do you have difficulty managing your mediation ?      Do you have difficulty managing your finances ?      Do you have difficulty affording your medication ?      Social Drivers of Corporate investment banker Strain: Low Risk  (07/04/2023)   Received from [facility]   Overall Financial Resource Strain (CARDIA)    Difficulty of Paying Living Expenses: Not hard at all  Food Insecurity: No Food Insecurity (07/04/2023)   Received from [facility]   Hunger Vital Sign    Worried About Running Out of Food in the Last Year: Never true    Ran Out of Food in the Last Year: Never true  Transportation Needs: No Transportation Needs (07/04/2023)   Received from [facility] - Transportation    Lack of Transportation (Medical): No    Lack of Transportation (Non-Medical): No  Physical Activity: Sufficiently Active (07/04/2023)   Received from [facility]  Exercise Vital Sign    Days of Exercise per Week: 5 days    Minutes of Exercise per Session: 40 min  Stress: No Stress Concern Present (07/04/2023)   Received from [facility] - Occupational Stress Questionnaire    Feeling of Stress : Only a little  Social Connections: Moderately Integrated (07/04/2023)   Received from [facility]   Social Network    How would you rate your social network (family, work, friends)?: Adequate participation with social networks  Intimate Partner Violence: Not At Risk (07/04/2023)   Received from [facility]   HITS    Over the last 12 months how often did your partner physically hurt you?: Never    Over the last 12 months how often did your partner insult you or talk down to you?: Never    Over the last 12 months how often did your  partner threaten you with physical harm?: Never    Over the last 12 months how often did your partner scream or curse at you?: Never      Phebe Brasil, M.D. Ph.D.  [facility] [address], [address] [place], [place] [address] Ph: [phone] Fax: [phone]  CC:  Alfredia Ina, MD [address] [place],  [place] [address]  Alfredia Ina, MD

## 2024-01-25 ENCOUNTER — Telehealth: Payer: Self-pay | Admitting: Occupational Therapy

## 2024-01-25 ENCOUNTER — Ambulatory Visit

## 2024-01-25 ENCOUNTER — Ambulatory Visit: Attending: Family Medicine | Admitting: Occupational Therapy

## 2024-01-25 DIAGNOSIS — R29818 Other symptoms and signs involving the nervous system: Secondary | ICD-10-CM | POA: Insufficient documentation

## 2024-01-25 DIAGNOSIS — G20A1 Parkinson's disease without dyskinesia, without mention of fluctuations: Secondary | ICD-10-CM | POA: Insufficient documentation

## 2024-01-25 DIAGNOSIS — R278 Other lack of coordination: Secondary | ICD-10-CM | POA: Insufficient documentation

## 2024-01-25 NOTE — Telephone Encounter (Signed)
 Dr. Onita, Devere Dibbles was seen for Parkinson's disease screen. She demonstrates bradykinesia, and decreased coordination which impedes daily activities.  Pt can benefit from skilled occupational therapy to address these deficits.  If you agree please send a referral for OT to :[facility] at [facility] [place], [place], [address] Phone: [phone]   Fax:  [phone]  Thanks, Lamarr Brought, OTR/L

## 2024-01-25 NOTE — Therapy (Signed)
 Occupational Therapy Parkinson's Disease Screen  Hand dominance:  RUE   Physical Performance Test item #2 (simulated eating):  8.51 sec  Physical Performance Test item #4 (donning/doffing jacket):  14.08 sec  Fastening/unfastening 3 buttons in:  26.42 sec  9-hole peg test:    RUE  28.81 sec        LUE  27.71 sec  Box & Blocks Test:   RUE  35 blocks bradykinesia (previously 49 blocks)      LUE  44  blocks    Change in ability to perform ADLs/IADLs:  friends report pt is slower with cooking     Pt would benefit from occupational therapy evaluation due to bradykinesia and decreased coordination. Lamarr Brought, OTR/L  11:35 AM 01/25/24

## 2024-01-25 NOTE — Therapy (Addendum)
 OUTPATIENT PHYSICAL THERAPY NEURO TREATMENT   Patient Name: Anita Schmidt MRN: [id number] DOB:1955/12/07, 68 y.o., female Today's Date: 01/25/2024   PCP: Pura Lenis, MD REFERRING PROVIDER: Onita Duos, MD   END OF SESSION:  PT End of Session - 01/25/24 1141     Visit Number 0    Activity Tolerance Patient tolerated treatment well    Behavior During Therapy [facility] for tasks assessed/performed          Past Medical History:  Diagnosis Date   Anxiety    severe   Anxiety disorder    Basal cell carcinoma 01/12/2011   BCC LEFT SIDE OF NOSE TX=MOHS   Fibrocystic breast disease    GERD (gastroesophageal reflux disease)    triggered with anxiety-not treated with meds   Gilbert's syndrome    Heart murmur    slight murmur dx by MD- no treatment-EKG clear   Hemochromatosis    High blood pressure    on meds   Hyperlipidemia    diet controlled   Skin cancer, basal cell    nose   Past Surgical History:  Procedure Laterality Date   BREAST BIOPSY Bilateral    COLONOSCOPY     colonscopy  2011   Tics ; Dr Jakie   DILATION AND CURETTAGE OF UTERUS     INGUINAL HERNIA REPAIR     MOHS SURGERY  2012   nose   TONSILLECTOMY     Tracer Pellets  2003   in breast; [facility]   WISDOM TOOTH EXTRACTION     Patient Active Problem List   Diagnosis Date Noted   Hyperreflexia 05/31/2023   Idiopathic Parkinson's disease (HCC) 05/31/2023   Right leg weakness 01/07/2021   Weakness of right hip 01/07/2021   Affective disorder (HCC) 10/18/2019   Abnormal EKG 07/03/2018   Infraspinatus strain 04/20/2018   It band syndrome, left 04/20/2018   Cough 10/27/2017   Wheezing 10/27/2017   Anxiety 10/27/2017   Hyperglycemia 04/07/2017   Insect bite 04/07/2017   Cardiac murmur 12/29/2016   Pes anserine bursitis 10/19/2016   Scapular dyskinesis 10/19/2016   Preventative health care 03/11/2016   Generalized anxiety disorder 05/24/2013   Benign essential hypertension 05/23/2013    Diverticulosis of colon without hemorrhage 04/27/2013   Malignant basal cell neoplasm of skin 04/27/2013   Hemochromatosis    Bee sting allergy 03/26/2011   IBS 02/10/2010   Anxiety state 12/25/2008   DISC DISEASE, CERVICAL 11/19/2008   Allergic rhinitis 09/19/2008   Hyperlipidemia 08/01/2008   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   MENOPAUSAL SYNDROME 08/01/2008   Personal history of disease 08/01/2008    ONSET DATE: 07/24/23  REFERRING DIAG: G20.C (ICD-10-CM) - Parkinsonism, unspecified Parkinsonism type (HCC)   THERAPY DIAG:  Parkinson's disease without dyskinesia, unspecified whether manifestations fluctuate (HCC)  Rationale for Evaluation and Treatment: Rehabilitation  SUBJECTIVE:  SUBJECTIVE STATEMENT: Patient reports that her knee and hip have been good since the last visit, has questions regarding HEP and movements with PD  Pt accompanied by: self  PERTINENT HISTORY:  Per referring phaysican note: Anita Schmidt is a 68 y.o. 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 Yes: NPRS scale: patient did not state. Pain location: R shoulder Pain description: N/A Aggravating factors: certain motions Relieving factors: avoiding the aggravating movements.  PRECAUTIONS: Fall  RED FLAGS: None   WEIGHT BEARING RESTRICTIONS: No  FALLS: Has patient fallen in last 6  months? No  LIVING ENVIRONMENT: Lives with: lives alone Lives in: House/apartment Stairs: Yes: Internal: 14 steps; on left going up and External: 4 steps; bilateral but cannot reach both Has following equipment at home: None  PLOF: Independent  PATIENT GOALS: Patient would like to identify any problems, Parkinson's education to minimize the impact on her daily life.  OBJECTIVE:  Note: Objective measures were completed at Evaluation unless otherwise noted.  DIAGNOSTIC FINDINGS:  Cerv CT 12/24 IMPRESSION: 1. No acute intracranial process. 2. No acute fracture or traumatic listhesis in the cervical spine. 3. Multilevel degenerative changes in the cervical spine, with mild-to-moderate spinal canal stenosis at C5-C6 and mild spinal canal stenosis at C6-C7. 4. Multilevel neural foraminal narrowing, severe on the right at C5-C6 and moderate bilaterally at C3-C4 and on the left at C5-C6. 5. 1.9 cm hypodense lesion in the right thyroid  lobe, with areas of calcification. If this has not previously been evaluated, a non-emergent ultrasound of the thyroid  is recommended. (Reference: J Am Coll Radiol. 2015 Feb;12(2): 143-50) 6. 7 mm ground-glass nodule in the right lung. Initial follow-up with CT at 6 months is recommended to confirm persistence. If persistent, repeat CT is recommended every 2 years until 5 years of stability has been established.  COGNITION: Overall cognitive status: Within functional limits for tasks assessed   SENSATION: Patient denies any change  COORDINATION: Heel on shin WNL  EDEMA:  swelling  MUSCLE TONE: no rigidity noted  MUSCLE LENGTH: Hamstrings: Right 85 deg; Left 75 deg Thomas test: Mildly tight  POSTURE: rounded shoulders and R scapula winging, patient reports spinal curvature  LOWER EXTREMITY ROM:  WNL B    LOWER EXTREMITY MMT:  5/5 BLE  BED MOBILITY:  I  TRANSFERS: I RAMP:  I  CURB:  I  STAIRS: Level of Assistance: Modified  independence Stair Negotiation Technique: Step to Pattern with Single Rail on Left Number of Stairs: 14  Comments: Patient reports that she does fine on steps as long as she uses a rail.  GAIT: Gait pattern: step through pattern, decreased arm swing- Right, decreased arm swing- Left, decreased step length- Right, and decreased step length- Left Distance walked: In clinic distances Assistive device utilized: None Level of assistance: Complete Independence Comments: Patient reports that she tries to focus on longer  steps.  FUNCTIONAL TESTS:  5 times sit to stand: 11.69 Timed up and go (TUG): 8.24, cog 8.84, dual 9.37 Functional gait assessment: 24/30  09/28/23 28/30                                                                                                                             TREATMENT DATE:  PT SCREEN TUG 7.80s 5xSTS 8.73s FGA 28/30  09/28/23 Elliptical level 3 x 3 minutes Gait outside around the back building cues for right arm swing Reviewed HEP for stretching and for PWR moves, also reviewed walking program, gym program and things that may be good for her to do, she also goes to classes Passive stretch of the LE's  09/21/23 Elliptical 3 minutes level 3 PWR Moves in standing On bosu reaching out of BOS Step turn touch Step and reach Opposite elbow to opposite knee then hand to heel in front Supine feet on ball K2C, rotation, bridge and iso abs Passive stretch of the LE's STM with Tgun to the left hip flexor and quad  09/14/23 Gait around the back building 2 laps brisk pace, cues for right arm swing and right hand open Walking ball toss Side step on and off airex On airex volley ball On airex 10# alternating arm pulls On airex 5# alternating punches On upside down bosu reaching, ball toss 40# resisted gait all directions Passive stretch left hip all motions  08/25/23 Ambulation outside around the back building 1 lap fast pace On bosu reaching for numbers on  wall On bosu head turns On airex ball toss Step turn touch PWR moves rotation Step volley ball 4 step with cues Tmill push fwd and backward Small jumps side to side, front to back  Passive LE stretches  08/23/23 Ambulated brisk pace around the back building x 2 some cues for right arm swing Back building stairs 2 full flights step over step On bosu two ways ball toss, volley ball On wobble board balance On bosu 2 ways reaching for numbers on wall with some math Skipping, power skipping LE stretches  08/18/23 Ambulated outdoors on unlevel surfaces, up and down curbs, fast walking speed, up and down inclines, no unsteadiness noted. Alternating cone taps while standing on airex pad, progressed to multiple taps, then taps with rotation.  1 foot on stool, rolling stool side to side with rotation in SLS. 4 square stepping, first clockwise, then counter clockwise, then changing directions unexpectedly. She got caught leaning the wrong way multiple times, but was able to adjust with no LOB. Heel rise, toes raise on airex plank, able to shift through full range.  08/16/23 Bike L4 x 4 min warm up AR with 5#, 1 x 10 reps each side Ball on wall, UUE, 5x each direction, x 2 Standing on upside down BOSU, static, lateral weight shifts, ant/post weight shifts, clock face. B side step onto and off airex pad with wide steps, x 10 each direction. B  side step on airex plank x 5 each way. SLS-forward I with arms overhead, x 10 on each leg B side step against G tband 5 x 8 steps in each direction, band started at knees, but moved to mid shin to increase resistance.  08/10/23:  Nustep level 5, 5 min 30 sec, LE's only, did not like the UE movement, too abrupt Reviewed and practiced the PWR moves that were provided last session.  She performed well.  We discussed performing in front of mirror to monitor her position particularly for R arm Supine for manual stretching for B hamstrings 60 sec holds, once  each leg Prone for R shoulder horizontal abd palm towards floor for engaging posterior shoulder musculature Practiced gait stepping over different height boxes, initially she tended to sling her leg around the box but corrected easily with instruction.  Single leg stance, able to stand for over 30 sec each foot.  PATIENT EDUCATION: Education details: POC Person educated: Patient Education method: Explanation Education comprehension: verbalized understanding  HOME EXERCISE PROGRAM: Standing PWR moves 08/09/23  GOALS: Goals reviewed with patient? Yes  SHORT TERM GOALS: Target date: 08/16/23  I with initial HEP Baseline: Goal status: 08/15/22-Provided patient with written PWR move instructions for standing activities, met  LONG TERM GOALS: Target date: 10/05/23  I with final HEP Baseline:  Goal status: met 09/28/23  2.  Patient will score 28/30 on FGA to demonstrate decreased fall risk Baseline:  Goal status: met 09/28/23  3.  Patient will achieve 100% on M-CTSIB Baseline:  Goal status: 08/09/23-passed screen, met  4.  Patient will demonstrate normalized gait pattern while ambulating at least 800' on level and unlevel surfaces including normalized step length, no shuffling or festination. I gait Baseline:  Goal status: met 09/28/23  5.  Patient will demonstrate the ability to climb up or down 12 steps going step over step, no UE support Baseline:  Goal status: met 09/28/23  6.  Patient will identify appropriate activities and the appropriate balance of activities to address her Parkinson's with living her daily life.  Baseline: Currently reports that her treatment activities are a bit overwhelming. Goal status: met 09/28/23  ASSESSMENT:  CLINICAL IMPRESSION: PT Parkinson's screen.  She has made improvements with functional tests and remains active in multiple classes. Overall she is doing well. Reports having some issues with her L hip that is intervening with her ability to  exercise as much as she would like. She is going to see a doctor on July 24th and was advised for them to send us  a referral if her pain continues.    OBJECTIVE IMPAIRMENTS: Abnormal gait, decreased activity tolerance, decreased balance, decreased coordination, decreased endurance, difficulty walking, decreased ROM, decreased strength, impaired flexibility, and impaired tone.   ACTIVITY LIMITATIONS: carrying, lifting, bending, standing, squatting, stairs, transfers, and locomotion level  PARTICIPATION LIMITATIONS: meal prep, cleaning, laundry, driving, shopping, and community activity  PERSONAL FACTORS: Past/current experiences are also affecting patient's functional outcome.   REHAB POTENTIAL: Good  CLINICAL DECISION MAKING: Evolving/moderate complexity  EVALUATION COMPLEXITY: Moderate  PLAN:  PT FREQUENCY: 2x/week  PT DURATION: 10 weeks  PLANNED INTERVENTIONS: 97110-Therapeutic exercises, 97530- Therapeutic activity, 97112- Neuromuscular re-education, 97535- Self Care, [id number]- Manual therapy, [phone]- Gait training, Patient/Family education, Balance training, Dry Needling, Joint mobilization, Spinal mobilization, Cryotherapy, and Moist heat  PLAN FOR NEXT SESSION: D/C goals met  Almetta Fam, PT, DPT 01/25/24 11:42 AM

## 2024-02-02 NOTE — Telephone Encounter (Signed)
Orders Placed This Encounter  ?Procedures  ? Ambulatory referral to Occupational Therapy  ? ?   ?

## 2024-02-09 ENCOUNTER — Encounter: Payer: Self-pay | Admitting: Sports Medicine

## 2024-02-09 ENCOUNTER — Ambulatory Visit: Admitting: Sports Medicine

## 2024-02-09 VITALS — BP 136/84 | Ht 66.0 in | Wt 133.0 lb

## 2024-02-09 DIAGNOSIS — G8929 Other chronic pain: Secondary | ICD-10-CM | POA: Diagnosis not present

## 2024-02-09 DIAGNOSIS — M25552 Pain in left hip: Secondary | ICD-10-CM

## 2024-02-09 NOTE — Assessment & Plan Note (Signed)
 Patient is to continue with any activities that she can do that do not increase her pain substantially She does not really take any medications and does not want to We will schedule an AP of the pelvis and 2-3 views of the left hip  Of note the only prominent Parkinson's feature she had today was a bit of masked facies  Her gait evaluation showed a slight Trendelenburg to the left and her arm swing and leg swing was symmetrical although she says she has to think and concentrate to get armswing  We will discuss treatment options once I see the results of her x-rays

## 2024-02-09 NOTE — Progress Notes (Addendum)
 Patient enters complaining of left hip pain  Patient is active and regularly does walking In the past she has done some running She does strength exercise However over the past few months she has noted some progressive pain that is in her left groin She thought this might have been some sort of muscular strain of her hip flexors or from some exercises or lifting however she cannot recall a specific injury  This affects her walking some After walking she notices some pain and sometimes has some night pain  Recently she was diagnosed with early Parkinson's disease She is trying to stay as active as possible to counter the effects of this  Physical exam Pleasant older female in no acute distress BP 136/84 (BP Location: Left Arm, Patient Position: Sitting)   Ht 5' 6 (1.676 m)   Wt 133 lb (60.3 kg)   LMP 07/19/2008   BMI 21.47 kg/m   Left hip shows only 10 to 15 degrees of internal rotation whereas the right hip shows 30 degrees. 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  She really wants to be conservative and not take any medications.  She tried both the exercycle and the elliptical and could do both of those without significant pain.  She would really like to get into some type of class that might allow her to treat her hip arthritis with rehabilitation and continued activity.  I suggested that she contact PT Pilates and give a try of using Pilates to keep her hip and low back areas more functional.  She plans to let me know how this is going.  PATRICE Haddock, MD

## 2024-02-10 ENCOUNTER — Ambulatory Visit
Admission: RE | Admit: 2024-02-10 | Discharge: 2024-02-10 | Disposition: A | Discharge status: Home / Self Care | Source: Ambulatory Visit | Attending: Sports Medicine | Admitting: Sports Medicine

## 2024-02-10 DIAGNOSIS — G8929 Other chronic pain: Secondary | ICD-10-CM

## 2024-02-13 ENCOUNTER — Encounter: Payer: Self-pay | Admitting: Occupational Therapy

## 2024-02-13 ENCOUNTER — Ambulatory Visit: Attending: Neurology | Admitting: Occupational Therapy

## 2024-02-13 DIAGNOSIS — R2689 Other abnormalities of gait and mobility: Secondary | ICD-10-CM | POA: Insufficient documentation

## 2024-02-13 DIAGNOSIS — R2681 Unsteadiness on feet: Secondary | ICD-10-CM | POA: Diagnosis present

## 2024-02-13 DIAGNOSIS — G20A1 Parkinson's disease without dyskinesia, without mention of fluctuations: Secondary | ICD-10-CM | POA: Diagnosis not present

## 2024-02-13 DIAGNOSIS — R278 Other lack of coordination: Secondary | ICD-10-CM | POA: Insufficient documentation

## 2024-02-13 DIAGNOSIS — M25621 Stiffness of right elbow, not elsewhere classified: Secondary | ICD-10-CM | POA: Diagnosis present

## 2024-02-13 DIAGNOSIS — R29818 Other symptoms and signs involving the nervous system: Secondary | ICD-10-CM | POA: Diagnosis present

## 2024-02-13 NOTE — Patient Instructions (Signed)
 PWR! Hands  With arms stretched out in front of you (elbows straight), perform the following: PWR! Rock: Move wrists up and down [place]! Twist: Twist palms up and down BIG  Then, start with elbows bent and hands closed. PWR! Step: Touch index finger to thumb while keeping other fingers straight. Flick fingers out BIG (thumb out/straighten fingers). Repeat with other fingers. (Step your thumb to each finger). PWR! Hands: Push hands out BIG. Elbows straight, wrists up, fingers open and spread apart BIG. (Can also perform by pushing down on table, chair, knees. Push above head, out to the side, behind you, in front of you.)   ** Make each movement big and deliberate so that you feel the movement.  Perform at least 10 repetitions 1x/day, but perform PWR! hands throughout the day when you are having trouble using your hands (picking up/manipulating small objects, writing, eating, typing, sewing, buttoning, etc.).     Coordination Exercises  Perform the following exercises for 10 minutes 1 times per day. Perform with both hand(s). Perform using big movements.  Flipping Cards: Place deck of cards on the table. Flip cards over by opening your hand big to grasp and then turn your palm up big. Deal cards: Hold 1/2 or whole deck in your hand. Use thumb to push card off top of deck with one big push. Rotate ball with fingertips: Pick up with fingers/thumb and move as much as you can with each turn/movement (clockwise and counter-clockwise).

## 2024-02-13 NOTE — Therapy (Signed)
 OUTPATIENT OCCUPATIONAL THERAPY [facility] EVALUATION  Patient Name: Anita Schmidt MRN: [id number] DOB:[birth year], 68 y.o., female Today's Date: 02/13/2024  PCP: Dr. Pura REFERRING PROVIDER: Dr. Onita  END OF SESSION:  OT End of Session - 02/13/24 1022     Visit Number 1    OT Start Time 1020    OT Stop Time 1055    OT Time Calculation (min) 35 min    Activity Tolerance Patient tolerated treatment well    Behavior During Therapy WFL for tasks assessed/performed          Past Medical History:  Diagnosis Date   Anxiety    severe   Anxiety disorder    Basal cell carcinoma 01/12/2011   BCC LEFT SIDE OF NOSE TX=MOHS   Fibrocystic breast disease    GERD (gastroesophageal reflux disease)    triggered with anxiety-not treated with meds   Gilbert's syndrome    Heart murmur    slight murmur dx by MD- no treatment-EKG clear   Hemochromatosis    High blood pressure    on meds   Hyperlipidemia    diet controlled   Skin cancer, basal cell    nose   Past Surgical History:  Procedure Laterality Date   BREAST BIOPSY Bilateral    COLONOSCOPY     colonscopy  2011   Tics ; Dr Jakie   DILATION AND CURETTAGE OF UTERUS     INGUINAL HERNIA REPAIR     MOHS SURGERY  2012   nose   TONSILLECTOMY     Tracer Pellets  2003   in breast; [facility]   WISDOM TOOTH EXTRACTION     Patient Active Problem List   Diagnosis Date Noted   Chronic left hip pain 02/09/2024   Hyperreflexia 05/31/2023   Idiopathic Parkinson's disease (HCC) 05/31/2023   Right leg weakness 01/07/2021   Weakness of right hip 01/07/2021   Affective disorder (HCC) 10/18/2019   Abnormal EKG 07/03/2018   Infraspinatus strain 04/20/2018   It band syndrome, left 04/20/2018   Cough 10/27/2017   Wheezing 10/27/2017   Anxiety 10/27/2017   Hyperglycemia 04/07/2017   Insect bite 04/07/2017   Cardiac murmur 12/29/2016   Pes anserine bursitis 10/19/2016   Scapular dyskinesis 10/19/2016   Preventative health care  03/11/2016   Generalized anxiety disorder 05/24/2013   Benign essential hypertension 05/23/2013   Diverticulosis of colon without hemorrhage 04/27/2013   Malignant basal cell neoplasm of skin 04/27/2013   Hemochromatosis    Bee sting allergy 03/26/2011   IBS 02/10/2010   Anxiety state 12/25/2008   DISC DISEASE, CERVICAL 11/19/2008   Allergic rhinitis 09/19/2008   Hyperlipidemia 08/01/2008   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   MENOPAUSAL SYNDROME 08/01/2008   Personal history of disease 08/01/2008    ONSET DATE: 01/25/24- date of PD screen with needs identified  REFERRING DIAG: Parkinson's disease  THERAPY DIAG:  Other lack of coordination  Other symptoms and signs involving the nervous system  Unsteadiness on feet  Stiffness of right elbow, not elsewhere classified  Rationale for Evaluation and Treatment: Rehabilitation  SUBJECTIVE:   SUBJECTIVE STATEMENT: Pt reports since screen she has been trying to use her RUE more Pt accompanied by: self  PERTINENT HISTORY: Parkinsonism, recently diagnosed November 2024, however symptomatic with decreased arm swing for several years. PMHx of HTN, ostoporosis, anxiety   PRECAUTIONS: None  WEIGHT BEARING RESTRICTIONS: No  PAIN:  Are you having pain? No  FALLS: Has patient fallen in last 6 months? No  LIVING ENVIRONMENT: Lives with: lives with their family and lives alone Lives in: House/apartment Stairs: internal  PLOF: Independent  PATIENT GOALS: maximize independence  OBJECTIVE:  Note: Objective measures were completed at Evaluation unless otherwise noted.  HAND DOMINANCE: Right  ADLs:mod I with all basic ADLS Overall ADLs: slower overall  Transfers/ambulation related to ADLs:mod I Eating: mod I Grooming: difficulty brushing teeth, slower with teeth bushing and putting away toothbrush, difficulty with applying lotion with RUE UB Dressing: mod I however pt defers to LUE if in a hurry LB Dressing: slower  with donning shoes Toileting: mod I Bathing: bathing with RUE, however it is slow and difficulty using RUE Tub Shower transfers: bathtub/ shower, mod I   IADLs: Pt report difficulty wiping with with a sponge, Pt is mod I with all home management, increased time required.  Handwriting: 100% legible  MOBILITY STATUS: Independent      FUNCTIONAL OUTCOME MEASURES:(from screen 01/25/24) Fastening/unfastening 3 buttons: 26.42 Physical performance test: PPT#2 (simulated eating) 8.51 & PPT#4 (donning/doffing jacket): 14.08  COORDINATION: (from screen 01/25/24)- Box & Blocks Test:   RUE  35 blocks bradykinesia (previously 49 blocks)      LUE  44  blocks  9-hole peg test:    RUE  28.81 sec        LUE  27.71 sec    UE ROM: RUE shoulder flexion WFLS, elbow extenion -10, supination/ pronation WFLS , decreased RUE wrist extension and pt maintains slight flexion at MP joints, LUE WFLS     SENSATION: WFL  MUSCLE TONE: RUE: Mild and Rigidity  COGNITION: Overall cognitive status: Within functional limits for tasks assessed  OBSERVATIONS: Bradykinesia                                                                                                                    TREATMENT DATE: 02/13/24 eval, see pt education   PATIENT EDUCATION: Education details: role of OT, potential goals, reviewed PWR! hands basic 4, coordination activities, flipping cards and dealing cards, rotating ball with big movements- issue handout next visit Person educated: Patient Education method: Explanation, demonstration Education comprehension: verbalized understanding, returned demosntration, v.c  HOME EXERCISE PROGRAM: 02/13/24- flipping and dealing playing cards, rotating ball, PWR! hands  GOALS: Goals reviewed with patient? Yes      LONG TERM GOALS: Target date: 03/16/24  I with HEP  Goal status: INITIAL  2.  Pt will verbalize understanding of adapted strategies, AE to maximize I with  ADLs/IADLs.  Goal status: INITIAL  3.  Pt will demonstrate improved functional use of RUE as evidenced by increasing box/ blocks score to 47 blocks or greater.  Goal status: INITIAL 4.Pt will report increased ease with using RUE for bathing and for wiping surfaces in kitchen   Goal status: inital  5. Pt will retrieve an item from [facility] shelf with -5 elbow extension RUE.  Goal status: inital   ASSESSMENT:  CLINICAL IMPRESSION: Patient is a 68 y.o. female  who was seen today for occupational  therapy evaluation for Parkinson's disease. Pt was seen for a PD screen 01/25/24, and pt was noted to have bradykensia and decreased RUE coordiantion. Pt presents with the performance deficits below. She can benefit from skilled occupational therapy to address these deficits in order to maximize pt's safety and I with ADLs/IADLs.[email or web address]   PERFORMANCE DEFICITS: in functional skills including ADLs, IADLs, coordination, dexterity, tone, ROM, strength, flexibility, Fine motor control, Gross motor control, mobility, balance, endurance, decreased knowledge of precautions, decreased knowledge of use of DME, and UE functional use, , and psychosocial skills including coping strategies, environmental adaptation, habits, interpersonal interactions, and routines and behaviors.   IMPAIRMENTS: are limiting patient from ADLs, IADLs, rest and sleep, play, leisure, and social participation.   COMORBIDITIES:  may have co-morbidities  that affects occupational performance. Patient will benefit from skilled OT to address above impairments and improve overall function.  MODIFICATION OR ASSISTANCE TO COMPLETE EVALUATION: No modification of tasks or assist necessary to complete an evaluation.  OT OCCUPATIONAL PROFILE AND HISTORY: Detailed assessment: Review of records and additional review of physical, cognitive, psychosocial history related to current functional performance.  CLINICAL DECISION MAKING: LOW - limited treatment  options, no task modification necessary  REHAB POTENTIAL: Good  EVALUATION COMPLEXITY: Low    PLAN:  OT FREQUENCY: 1x/week plus eval  OT DURATION: 5 weeks  PLANNED INTERVENTIONS: 97168 OT Re-evaluation, 97535 self care/ADL training, 02889 therapeutic exercise, 97530 therapeutic activity, 97112 neuromuscular re-education, 97140 manual therapy, 97116 gait training, 02886 aquatic therapy, 97035 ultrasound, 97018 paraffin, 02989 moist heat, 97750 Physical Performance Testing, passive range of motion, balance training, functional mobility training, psychosocial skills training, energy conservation, coping strategies training, patient/family education, and DME and/or AE instructions  RECOMMENDED OTHER SERVICES: n/a  CONSULTED AND AGREED WITH PLAN OF CARE: Patient  PLAN FOR NEXT SESSION: issue handout for PWR! hands, coordination HEP, functional use of bilateral UE's simultaneously   Julianah Marciel, OT 02/13/2024, 10:23 AM

## 2024-02-20 ENCOUNTER — Ambulatory Visit: Admitting: Sports Medicine

## 2024-02-21 ENCOUNTER — Encounter: Payer: Self-pay | Admitting: Occupational Therapy

## 2024-02-21 ENCOUNTER — Ambulatory Visit: Attending: Family Medicine | Admitting: Occupational Therapy

## 2024-02-21 DIAGNOSIS — M25621 Stiffness of right elbow, not elsewhere classified: Secondary | ICD-10-CM | POA: Insufficient documentation

## 2024-02-21 DIAGNOSIS — R278 Other lack of coordination: Secondary | ICD-10-CM | POA: Diagnosis present

## 2024-02-21 DIAGNOSIS — R2681 Unsteadiness on feet: Secondary | ICD-10-CM | POA: Insufficient documentation

## 2024-02-21 DIAGNOSIS — R29818 Other symptoms and signs involving the nervous system: Secondary | ICD-10-CM | POA: Insufficient documentation

## 2024-02-21 DIAGNOSIS — R2689 Other abnormalities of gait and mobility: Secondary | ICD-10-CM | POA: Insufficient documentation

## 2024-02-21 NOTE — Therapy (Unsigned)
 OUTPATIENT OCCUPATIONAL THERAPY [facility] EVALUATION  Patient Name: Anita Schmidt MRN: [id number] DOB:May 27, 1956, 68 y.o., female Today's Date: 02/21/2024  PCP: Dr. Pura REFERRING PROVIDER: Dr. Onita  END OF SESSION:  OT End of Session - 02/21/24 1319     Visit Number 2          Past Medical History:  Diagnosis Date   Anxiety    severe   Anxiety disorder    Basal cell carcinoma 01/12/2011   BCC LEFT SIDE OF NOSE TX=MOHS   Fibrocystic breast disease    GERD (gastroesophageal reflux disease)    triggered with anxiety-not treated with meds   Gilbert's syndrome    Heart murmur    slight murmur dx by MD- no treatment-EKG clear   Hemochromatosis    High blood pressure    on meds   Hyperlipidemia    diet controlled   Skin cancer, basal cell    nose   Past Surgical History:  Procedure Laterality Date   BREAST BIOPSY Bilateral    COLONOSCOPY     colonscopy  2011   Tics ; Dr Jakie   DILATION AND CURETTAGE OF UTERUS     INGUINAL HERNIA REPAIR     MOHS SURGERY  2012   nose   TONSILLECTOMY     Tracer Pellets  2003   in breast; [facility]   WISDOM TOOTH EXTRACTION     Patient Active Problem List   Diagnosis Date Noted   Chronic left hip pain 02/09/2024   Hyperreflexia 05/31/2023   Idiopathic Parkinson's disease (HCC) 05/31/2023   Right leg weakness 01/07/2021   Weakness of right hip 01/07/2021   Affective disorder (HCC) 10/18/2019   Abnormal EKG 07/03/2018   Infraspinatus strain 04/20/2018   It band syndrome, left 04/20/2018   Cough 10/27/2017   Wheezing 10/27/2017   Anxiety 10/27/2017   Hyperglycemia 04/07/2017   Insect bite 04/07/2017   Cardiac murmur 12/29/2016   Pes anserine bursitis 10/19/2016   Scapular dyskinesis 10/19/2016   Preventative health care 03/11/2016   Generalized anxiety disorder 05/24/2013   Benign essential hypertension 05/23/2013   Diverticulosis of colon without hemorrhage 04/27/2013   Malignant basal cell neoplasm of skin  04/27/2013   Hemochromatosis    Bee sting allergy 03/26/2011   IBS 02/10/2010   Anxiety state 12/25/2008   DISC DISEASE, CERVICAL 11/19/2008   Allergic rhinitis 09/19/2008   Hyperlipidemia 08/01/2008   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  Rationale for Evaluation and Treatment: Rehabilitation  SUBJECTIVE:   SUBJECTIVE STATEMENT: Pt reports she is now showering only 2x week Pt accompanied by: self  PERTINENT HISTORY: Parkinsonism, recently diagnosed November 2024, however symptomatic with decreased arm swing for several years. PMHx of HTN, ostoporosis, anxiety   PRECAUTIONS: None  WEIGHT BEARING RESTRICTIONS: No  PAIN:  Are you having pain? No  FALLS: Has patient fallen in last 6 months? No  LIVING ENVIRONMENT: Lives with: lives with their family and lives alone Lives in: House/apartment Stairs: internal  PLOF: Independent  PATIENT GOALS: maximize independence  OBJECTIVE:  Note: Objective measures were completed at Evaluation unless otherwise noted.  HAND DOMINANCE: Right  ADLs:mod I with all basic ADLS Overall ADLs: slower overall  Transfers/ambulation related to ADLs:mod I Eating: mod I Grooming: difficulty brushing teeth, slower with teeth  bushing and putting away toothbrush, difficulty with applying lotion with RUE UB Dressing: mod I however pt defers to LUE if in a hurry LB Dressing: slower with donning shoes Toileting: mod I Bathing: bathing with RUE, however it is slow and difficulty using RUE Tub Shower transfers: bathtub/ shower, mod I   IADLs: Pt report difficulty wiping with with a sponge, Pt is mod I with all home management, increased time required.  Handwriting: 100% legible  MOBILITY STATUS:  Independent      FUNCTIONAL OUTCOME MEASURES:(from screen 01/25/24) Fastening/unfastening 3 buttons: 26.42 Physical performance test: PPT#2 (simulated eating) 8.51 & PPT#4 (donning/doffing jacket): 14.08  COORDINATION: (from screen 01/25/24)- Box & Blocks Test:   RUE  35 blocks bradykinesia (previously 49 blocks)      LUE  44  blocks  9-hole peg test:    RUE  28.81 sec        LUE  27.71 sec    UE ROM: RUE shoulder flexion WFLS, elbow extenion -10, supination/ pronation WFLS , decreased RUE wrist extension and pt maintains slight flexion at MP joints, LUE WFLS     SENSATION: WFL  MUSCLE TONE: RUE: Mild and Rigidity  COGNITION: Overall cognitive status: Within functional limits for tasks assessed  OBSERVATIONS: Bradykinesia                                                                                                                    TREATMENT DATE: 02/13/24 eval, see pt education   PATIENT EDUCATION: Education details: role of OT, potential goals, reviewed PWR! hands basic 4, coordination activities, flipping cards and dealing cards, rotating ball with big movements- issue handout next visit Person educated: Patient Education method: Explanation, demonstration Education comprehension: verbalized understanding, returned demosntration, v.c  HOME EXERCISE PROGRAM: 02/13/24- flipping and dealing playing cards, rotating ball, PWR! hands  GOALS: Goals reviewed with patient? Yes      LONG TERM GOALS: Target date: 03/16/24  I with HEP  Goal status:   2.  Pt will verbalize understanding of adapted strategies, AE to maximize I with ADLs/IADLs.  Goal status: INITIAL  3.  Pt will demonstrate improved functional use of RUE as evidenced by increasing box/ blocks score to 47 blocks or greater.  Goal status: INITIAL 4.Pt will report increased ease with using RUE for bathing and for wiping surfaces in kitchen   Goal status: inital  5. Pt will retrieve an item from [facility]  shelf with -5 elbow extension RUE.  Goal status: inital   ASSESSMENT:  CLINICAL IMPRESSION: Patient is a 68 y.o. female  who was seen today for occupational therapy evaluation for Parkinson's disease. Pt was seen for a PD screen 01/25/24, and pt was noted to have bradykensia and decreased RUE coordiantion. Pt presents with the performance deficits below. She can benefit from skilled occupational therapy to address these deficits in order to maximize pt's safety and I with ADLs/IADLs.SABRA   PERFORMANCE DEFICITS: in functional skills including ADLs, IADLs, coordination, dexterity, tone, ROM, strength, flexibility,  Fine motor control, Gross motor control, mobility, balance, endurance, decreased knowledge of precautions, decreased knowledge of use of DME, and UE functional use, , and psychosocial skills including coping strategies, environmental adaptation, habits, interpersonal interactions, and routines and behaviors.   IMPAIRMENTS: are limiting patient from ADLs, IADLs, rest and sleep, play, leisure, and social participation.   COMORBIDITIES:  may have co-morbidities  that affects occupational performance. Patient will benefit from skilled OT to address above impairments and improve overall function.  MODIFICATION OR ASSISTANCE TO COMPLETE EVALUATION: No modification of tasks or assist necessary to complete an evaluation.  OT OCCUPATIONAL PROFILE AND HISTORY: Detailed assessment: Review of records and additional review of physical, cognitive, psychosocial history related to current functional performance.  CLINICAL DECISION MAKING: LOW - limited treatment options, no task modification necessary  REHAB POTENTIAL: Good  EVALUATION COMPLEXITY: Low    PLAN:  OT FREQUENCY: 1x/week plus eval  OT DURATION: 5 weeks  PLANNED INTERVENTIONS: 97168 OT Re-evaluation, 97535 self care/ADL training, 02889 therapeutic exercise, 97530 therapeutic activity, 97112 neuromuscular re-education, 97140 manual  therapy, 97116 gait training, 02886 aquatic therapy, 97035 ultrasound, 97018 paraffin, 02989 moist heat, 97750 Physical Performance Testing, passive range of motion, balance training, functional mobility training, psychosocial skills training, energy conservation, coping strategies training, patient/family education, and DME and/or AE instructions  RECOMMENDED OTHER SERVICES: n/a  CONSULTED AND AGREED WITH PLAN OF CARE: Patient  PLAN FOR NEXT SESSION: activities with bilateral UE's   Kimbery Harwood, OT 02/21/2024, 1:21 PM

## 2024-02-21 NOTE — Patient Instructions (Addendum)
 Use a washcloth to scrub up and down  x 10 with both hands side to side x 10 circles each direction 10 x Move BIG, keep R arm moving as big as left! Perfrom daily!  Flip cards with both hands at the same time move big!

## 2024-03-01 ENCOUNTER — Inpatient Hospital Stay: Payer: Medicare HMO | Admitting: Hematology

## 2024-03-01 ENCOUNTER — Inpatient Hospital Stay: Payer: Medicare HMO | Attending: Hematology

## 2024-03-01 LAB — CBC WITH DIFFERENTIAL (CANCER CENTER ONLY)
Abs Immature Granulocytes: 0 K/uL (ref 0.00–0.07)
Basophils Absolute: 0 K/uL (ref 0.0–0.1)
Basophils Relative: 1 %
Eosinophils Absolute: 0.1 K/uL (ref 0.0–0.5)
Eosinophils Relative: 1 %
HCT: 39.8 % (ref 36.0–46.0)
Hemoglobin: 13.9 g/dL (ref 12.0–15.0)
Immature Granulocytes: 0 %
Lymphocytes Relative: 28 %
Lymphs Abs: 1.5 K/uL (ref 0.7–4.0)
MCH: 32 pg (ref 26.0–34.0)
MCHC: 34.9 g/dL (ref 30.0–36.0)
MCV: 91.7 fL (ref 80.0–100.0)
Monocytes Absolute: 0.5 K/uL (ref 0.1–1.0)
Monocytes Relative: 10 %
Neutro Abs: 3.2 K/uL (ref 1.7–7.7)
Neutrophils Relative %: 60 %
Platelet Count: 252 K/uL (ref 150–400)
RBC: 4.34 MIL/uL (ref 3.87–5.11)
RDW: 11.8 % (ref 11.5–15.5)
WBC Count: 5.3 K/uL (ref 4.0–10.5)
nRBC: 0 % (ref 0.0–0.2)

## 2024-03-01 LAB — FERRITIN: Ferritin: 66 ng/mL (ref 11–307)

## 2024-03-01 NOTE — Assessment & Plan Note (Signed)
-  Diagnosed in 2010. She has been followed by her GI only until 01/2019. Her ferritin has been in the range of 150-290, iron saturation 60-72% and higher before beginning phlebotomies -Her MRI from 04/30/19 shows iron deposition in the liver and spleen, compatible with hemochromatosis. Her 06/2018 ECHO was normal, repeat q3 years.  -She is currently being treated with therapeutic Phlebotomies as needed with target ferritin<50 and transferrin saturation less than 50%. She tolerates well as long as she can control anxiety --Continue same regimen which includes lab q3 months and phlebotomy if ferritin >50 or transferrin sat >50%.

## 2024-03-01 NOTE — Therapy (Signed)
 OUTPATIENT OCCUPATIONAL THERAPY [facility]  Treatment  Patient Name: Anita Schmidt MRN: [id number] DOB:02-04-1956, 68 y.o., female Today's Date: 03/02/2024  PCP: Dr. Pura REFERRING PROVIDER: Dr. Onita  END OF SESSION:  OT End of Session - 03/02/24 1104     Visit Number 3    Number of Visits 5    Date for OT Re-Evaluation 03/16/24    Authorization Type [employer] MCR    Authorization - Visit Number 3    Progress Note Due on Visit 10    OT Start Time 1103    OT Stop Time 1157    OT Time Calculation (min) 54 min    Activity Tolerance Patient tolerated treatment well    Behavior During Therapy WFL for tasks assessed/performed           Past Medical History:  Diagnosis Date   Anxiety    severe   Anxiety disorder    Basal cell carcinoma 01/12/2011   BCC LEFT SIDE OF NOSE TX=MOHS   Fibrocystic breast disease    GERD (gastroesophageal reflux disease)    triggered with anxiety-not treated with meds   Gilbert's syndrome    Heart murmur    slight murmur dx by MD- no treatment-EKG clear   Hemochromatosis    High blood pressure    on meds   Hyperlipidemia    diet controlled   Skin cancer, basal cell    nose   Past Surgical History:  Procedure Laterality Date   BREAST BIOPSY Bilateral    COLONOSCOPY     colonscopy  2011   Tics ; Dr Jakie   DILATION AND CURETTAGE OF UTERUS     INGUINAL HERNIA REPAIR     MOHS SURGERY  2012   nose   TONSILLECTOMY     Tracer Pellets  2003   in breast; [facility]   WISDOM TOOTH EXTRACTION     Patient Active Problem List   Diagnosis Date Noted   Chronic left hip pain 02/09/2024   Hyperreflexia 05/31/2023   Idiopathic Parkinson's disease (HCC) 05/31/2023   Right leg weakness 01/07/2021   Weakness of right hip 01/07/2021   Affective disorder (HCC) 10/18/2019   Abnormal EKG 07/03/2018   Infraspinatus strain 04/20/2018   It band syndrome, left 04/20/2018   Cough 10/27/2017   Wheezing 10/27/2017   Anxiety 10/27/2017   Hyperglycemia  04/07/2017   Insect bite 04/07/2017   Cardiac murmur 12/29/2016   Pes anserine bursitis 10/19/2016   Scapular dyskinesis 10/19/2016   Preventative health care 03/11/2016   Generalized anxiety disorder 05/24/2013   Benign essential hypertension 05/23/2013   Diverticulosis of colon without hemorrhage 04/27/2013   Malignant basal cell neoplasm of skin 04/27/2013   Hemochromatosis    Bee sting allergy 03/26/2011   IBS 02/10/2010   Anxiety state 12/25/2008   DISC DISEASE, CERVICAL 11/19/2008   Allergic rhinitis 09/19/2008   Hyperlipidemia 08/01/2008   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  Pt reports R 4th finger locking at times.   Pt accompanied by: self  PERTINENT HISTORY: Parkinsonism, recently diagnosed November 2024, however symptomatic with decreased arm swing for several years. PMHx of HTN, ostoporosis, anxiety   PRECAUTIONS: None  WEIGHT BEARING RESTRICTIONS: No  PAIN:  Are you having pain? No  FALLS: Has patient fallen in last 6 months? No  LIVING ENVIRONMENT: Lives with: lives with their family and lives alone Lives in: House/apartment Stairs: internal  PLOF: Independent  PATIENT GOALS: maximize independence  OBJECTIVE:  Note: Objective measures were completed at Evaluation unless otherwise noted.  HAND DOMINANCE: Right  ADLs:mod I with all basic ADLS Overall ADLs: slower overall  Transfers/ambulation related to ADLs:mod I Eating: mod I Grooming:  difficulty brushing teeth, slower with teeth bushing and putting away toothbrush, difficulty with applying lotion with RUE UB Dressing: mod I however pt defers to LUE if in a hurry LB Dressing: slower with donning shoes Toileting: mod I Bathing: bathing with RUE, however it is slow and difficulty using RUE Tub Shower transfers: bathtub/ shower, mod I   IADLs: Pt report difficulty wiping with with a sponge, Pt is mod I with all home management, increased time required.  Handwriting: 100% legible  MOBILITY STATUS: Independent   FUNCTIONAL OUTCOME MEASURES:(from screen 01/25/24) Fastening/unfastening 3 buttons: 26.42 Physical performance test: PPT#2 (simulated eating) 8.51 & PPT#4 (donning/doffing jacket): 14.08  COORDINATION: (from screen 01/25/24)- Box & Blocks Test:   RUE  35 blocks bradykinesia (previously 49 blocks)      LUE  44  blocks  9-hole peg test:    RUE  28.81 sec        LUE  27.71 sec    UE ROM: RUE shoulder flexion WFLS, elbow extenion -10, supination/ pronation WFLS , decreased RUE wrist extension and pt maintains slight flexion at MP joints, LUE WFLS     SENSATION: WFL  MUSCLE TONE: RUE: Mild and Rigidity  COGNITION: Overall cognitive status: Within functional limits for tasks assessed  OBSERVATIONS: Bradykinesia                                                                                                                    TREATMENT DATE:    03/02/24:  Isolated IP flex and thumb circles (R over L) with focus on large amplitude with min-mod cueing.  Emphasized stretching hand in palm at MP and thumb CMC vs. IP to avoid joint deformity and for improved positioning.    Standing, discussed importance and application of using large amplitude forward/backward wt. Shifts and side steps and then performed PWR! Step forward, and to each side as well as PWR! Rock for improved incr arm swing and transitional movements as well as functional reach.    Pt instructed in  use of large amplitude movements for IADL tasks including putting hangers on rack, folding towels, wiping table, sweeping, and vacuuming.  Pt practiced use of large amplitude movements for these activities with min-mod cueing (verbal and demo).  Discussed benefit of appropriate dosage of medication as well as exercise dosage to decr risk future complications and  slow functional changes.  Reviewed principles of exercise for neuroplasticity/brain change.  Also discussed various ways to incorporate therapy principles and HEP into daily/weekly schedule.  (Pt has check list from prior therapy).  Encouraged pt to pick at least 2 activities a day to deliberately incr effort and movement size and incorporate small movement breaks between other activities.  Emphasized large amplitude and not speed, particularly with walking for improved function and emphasize speed with cycling.    02/21/24- PWR! up, rock and twist in modified quadraped, 10 reps each min v.c  Avoided PWR! step due to hip pain PWR! hands basic 4 10 reps each min v.c.  Flipping cards with RUE then both hands simultanosuly and dealing cards with big movments discussed ADLS, pt rpeorts increased time for bathing and hair washing, therapist recommends pt perfroms at night so she is not rushed in a.m.  Rotating a numerical block in RUE to locate a specific number for increased coordiantion, min v.c Discussed starting to wash hair with RUE then having LUE and emphasis on big movements. Wall slides with washcloth under hands and emphasis on RUE moving as BIG as left, 10 reps each, for shoulder flexion, abduction, circumduction each direction,  min v.c for amplitude. Stacking blocks with bilateral UE's simultaneously, min v.c   02/13/24 eval, see pt education   PATIENT EDUCATION: Education details: see above; instructed pt in isolated IP flex with MP ext and thumb circles--(please print pt instructions next session) Person educated: Patient Education  method: Explanation, Demonstration, and Verbal cues, Education comprehension: verbalized understanding, returned demonstration, verbal cues required, and needs further education  HOME EXERCISE PROGRAM: 02/13/24- flipping and dealing playing cards, rotating ball, PWR! Hands 03/02/24- instructed pt in isolated IP flex with MP ext and thumb circles  GOALS: Goals reviewed with patient? Yes    LONG TERM GOALS: Target date: 03/16/24  I with HEP  Goal status: ongoing 02/22/24  2.  Pt will verbalize understanding of adapted strategies, AE to maximize I with ADLs/IADLs.  Goal status: initated, needs additional strategies 02/22/24  3.  Pt will demonstrate improved functional use of RUE as evidenced by increasing box/ blocks score to 47 blocks or greater.  Goal status: ongoing, 02/22/24 4.Pt will report increased ease with using RUE for bathing and for wiping surfaces in kitchen   Goal status: ongoing 02/22/24  5. Pt will retrieve an item from [facility] shelf with -5 elbow extension RUE.  Goal status: iongoing 02/22/24   ASSESSMENT:  CLINICAL IMPRESSION: Patient is progressing towards goals. She repsonds well to v.c for amplitude during functional activity but will benefit from further reinforcement and cueing to improve carryover.   PERFORMANCE DEFICITS: in functional skills including ADLs, IADLs, coordination, dexterity, tone, ROM, strength, flexibility, Fine motor control, Gross motor control, mobility, balance, endurance, decreased knowledge of precautions, decreased knowledge of use of DME, and UE functional use, , and psychosocial skills including coping strategies, environmental adaptation, habits, interpersonal interactions, and routines and behaviors.   IMPAIRMENTS: are limiting patient from ADLs, IADLs, rest and sleep, play, leisure, and social participation.   COMORBIDITIES:  may have co-morbidities  that affects occupational performance. Patient will benefit from skilled OT to address  above impairments and improve overall function.  MODIFICATION OR ASSISTANCE TO COMPLETE EVALUATION: No modification of tasks or assist necessary to complete an evaluation.  OT OCCUPATIONAL PROFILE AND HISTORY: Detailed assessment: Review of records and additional review of physical, cognitive, psychosocial history related to current functional performance.  CLINICAL DECISION MAKING: LOW -  limited treatment options, no task modification necessary  REHAB POTENTIAL: Good  EVALUATION COMPLEXITY: Low    PLAN:  OT FREQUENCY: 1x/week plus eval  OT DURATION: 5 weeks  PLANNED INTERVENTIONS: 97168 OT Re-evaluation, 97535 self care/ADL training, 02889 therapeutic exercise, 97530 therapeutic activity, 97112 neuromuscular re-education, 97140 manual therapy, 97116 gait training, 02886 aquatic therapy, 97035 ultrasound, 97018 paraffin, 02989 moist heat, 97750 Physical Performance Testing, passive range of motion, balance training, functional mobility training, psychosocial skills training, energy conservation, coping strategies training, patient/family education, and DME and/or AE instructions  RECOMMENDED OTHER SERVICES: n/a  CONSULTED AND AGREED WITH PLAN OF CARE: Patient  PLAN FOR NEXT SESSION: **issue handout for isolated IP flex and thumb circles (print pt instructions 03/02/24), ADL strategies, activities with bilateral UEs   Chrisa Hassan, OTR/L 03/02/2024, 12:00 PM

## 2024-03-01 NOTE — Progress Notes (Signed)
 [facility]   [phone] [phone]   Clinic Follow up Note   Patient Care Team: Pura Lenis, MD as PCP - General (Family Medicine) Lanny Callander, MD as Consulting Physician (Hematology) Cathlyn JAYSON Cary, Bobie BRAVO, MD as Consulting Physician (Obstetrics and Gynecology) Mable Lenis, MD (Inactive) as Consulting Physician (Otolaryngology) Rosan Credit, MD as Consulting Physician (Ophthalmology) Porter Andrez SAUNDERS, PA-C (Inactive) as Physician Assistant (Dermatology) Armbruster, Elspeth SQUIBB, MD as Consulting Physician (Gastroenterology)  Date of Service:  03/01/2024  CHIEF COMPLAINT: f/u of hemochromatosis  CURRENT THERAPY:  Phlebotomy if ferritin above 50  Oncology History   Hemochromatosis -Diagnosed in 2010. She has been followed by her GI only until 01/2019. Her ferritin has been in the range of 150-290, iron saturation 60-72% and higher before beginning phlebotomies -Her MRI from 04/30/19 shows iron deposition in the liver and spleen, compatible with hemochromatosis. Her 06/2018 ECHO was normal, repeat q3 years.  -She is currently being treated with therapeutic Phlebotomies as needed with target ferritin<50 and transferrin saturation less than 50%. She tolerates well as long as she can control anxiety --Continue same regimen which includes lab q3 months and phlebotomy if ferritin >50 or transferrin sat >50%.   Assessment & Plan Hereditary hemochromatosis Hereditary hemochromatosis is well-managed with ferritin levels consistently below 50, eliminating the need for phlebotomy in the past two years. Dietary modifications, including avoiding red meat and iron supplements, along with regular exercise, contribute to stable iron levels. No evidence of joint problems related to iron deposition is present. - Check iron levels every six months, with one test by her primary care physician in December and another in June during follow-up. - Schedule  follow-up appointment in June 2027. - Send notes to her primary care physician, Dr. Phillip, at [facility] practice. - Encourage continued exercise to maintain overall health and potentially aid in managing Parkinson's disease.   Plan - Lab reviewed, CBC is normal, ferritin level still pending. - She has not required any phlebotomy in the past 2 years - She will check CBC and ferritin every 6 months, once here, once with her PCP at [facility] - I will see her back in 2 years.    Discussed the use of AI scribe software for clinical note transcription with the patient, who gave verbal consent to proceed.  History of Present Illness Anita Schmidt is a 68 year old female with hemochromatosis who presents for follow-up.  She maintains stable iron levels below the target of 50 without phlebotomy. Her diet excludes red meat, focusing on fish, chicken, and low-iron foods.     All other systems were reviewed with the patient and are negative.  MEDICAL HISTORY:  Past Medical History:  Diagnosis Date   Anxiety    severe   Anxiety disorder    Basal cell carcinoma 01/12/2011   BCC LEFT SIDE OF NOSE TX=MOHS   Fibrocystic breast disease    GERD (gastroesophageal reflux disease)    triggered with anxiety-not treated with meds   Gilbert's syndrome    Heart murmur    slight murmur dx by MD- no treatment-EKG clear   Hemochromatosis    High blood pressure    on meds   Hyperlipidemia    diet controlled   Skin cancer, basal cell    nose    SURGICAL HISTORY: Past Surgical History:  Procedure Laterality Date   BREAST BIOPSY Bilateral    COLONOSCOPY     colonscopy  2011   Tics ; Dr  Patterson   DILATION AND CURETTAGE OF UTERUS     INGUINAL HERNIA REPAIR     MOHS SURGERY  2012   nose   TONSILLECTOMY     Tracer Pellets  2003   in breast; [facility]   WISDOM TOOTH EXTRACTION      I have reviewed the social history and family history with the patient and they are unchanged from previous  note.  ALLERGIES:  is allergic to latex, epinephrine, effexor  [venlafaxine ], amoxicillin, and citalopram.  MEDICATIONS:  Current Outpatient Medications  Medication Sig Dispense Refill   amLODipine  (NORVASC ) 2.5 MG tablet Take 1 tablet (2.5 mg total) by mouth daily. Overdue for Annual appt w/last must see provider for future refills 30 tablet 0   Cholecalciferol (VITAMIN D3) 25 MCG (1000 UT) CAPS Take 1 capsule by mouth daily.     Cyanocobalamin  (VITAMIN B 12 PO) Take 1,000 Units by mouth daily.     denosumab (PROLIA) 60 MG/ML SOSY injection      rasagiline  (AZILECT ) 0.5 MG TABS tablet Take 1 tablet (0.5 mg total) by mouth daily. 90 tablet 3   No current facility-administered medications for this visit.    PHYSICAL EXAMINATION: ECOG PERFORMANCE STATUS: 0 - Asymptomatic  Vitals:   03/01/24 1244  BP: 126/76  Pulse: 73  Resp: 15  Temp: 98.1 F (36.7 C)  SpO2: 99%   Wt Readings from Last 3 Encounters:  03/01/24 132 lb 9.6 oz (60.1 kg)  02/09/24 133 lb (60.3 kg)  11/28/23 133 lb 8 oz (60.6 kg)     GENERAL:alert, no distress and comfortable SKIN: skin color, texture, turgor are normal, no rashes or significant lesions Musculoskeletal:no cyanosis of digits and no clubbing  NEURO: alert & oriented x 3 with fluent speech, no focal motor/sensory deficits  Physical Exam    LABORATORY DATA:  I have reviewed the data as listed    Latest Ref Rng & Units 03/01/2024   12:27 PM 09/01/2023    9:06 AM 03/03/2023    9:02 AM  CBC  WBC 4.0 - 10.5 K/uL 5.3  4.7  4.7   Hemoglobin 12.0 - 15.0 g/dL 86.0  84.9  85.6   Hematocrit 36.0 - 46.0 % 39.8  44.2  42.1   Platelets 150 - 400 K/uL 252  282  251         Latest Ref Rng & Units 07/05/2019    2:12 PM 04/27/2019   11:29 AM 05/23/2018    9:01 AM  CMP  Glucose 70 - 99 mg/dL 80  891  885   BUN 6 - 23 mg/dL 16  15  16    Creatinine 0.40 - 1.20 mg/dL 8.91  9.23  9.19   Sodium 135 - 145 mEq/L 141  141  140   Potassium 3.5 - 5.1 mEq/L 3.9   4.1  4.0   Chloride 96 - 112 mEq/L 104  106  104   CO2 19 - 32 mEq/L 31  28  29    Calcium 8.4 - 10.5 mg/dL 9.5  9.4  9.5   Total Protein 6.0 - 8.3 g/dL 6.7  6.9  6.8   Total Bilirubin 0.2 - 1.2 mg/dL 0.6  0.6  0.8   Alkaline Phos 39 - 117 U/L 55  59  57   AST 0 - 37 U/L 15  17  17    ALT 0 - 35 U/L 11  13  12        RADIOGRAPHIC STUDIES: I have personally reviewed the  radiological images as listed and agreed with the findings in the report. No results found.    No orders of the defined types were placed in this encounter.  All questions were answered. The patient knows to call the clinic with any problems, questions or concerns. No barriers to learning was detected. The total time spent in the appointment was 15 minutes, including review of chart and various tests results, discussions about plan of care and coordination of care plan     Onita Mattock, MD 03/01/2024

## 2024-03-02 ENCOUNTER — Encounter: Payer: Self-pay | Admitting: Occupational Therapy

## 2024-03-02 ENCOUNTER — Ambulatory Visit: Admitting: Occupational Therapy

## 2024-03-02 DIAGNOSIS — R2681 Unsteadiness on feet: Secondary | ICD-10-CM

## 2024-03-02 DIAGNOSIS — R278 Other lack of coordination: Secondary | ICD-10-CM

## 2024-03-02 DIAGNOSIS — M25621 Stiffness of right elbow, not elsewhere classified: Secondary | ICD-10-CM

## 2024-03-02 DIAGNOSIS — R29818 Other symptoms and signs involving the nervous system: Secondary | ICD-10-CM

## 2024-03-02 NOTE — Patient Instructions (Signed)
   Flexor Tendon Gliding (Active Hook Fist)   With fingers and knuckles straight, bend middle and tip joints. Keep large knuckles straight. Repeat 10 times. Do 1 sessions per day.  Thumb Circles    Move right thumb in a circle. Around left thumb--keep left thumb still.   Repeat 10 times each direction per session. Do 1x/day.

## 2024-03-03 ENCOUNTER — Ambulatory Visit: Payer: Self-pay | Admitting: Hematology

## 2024-03-05 ENCOUNTER — Encounter: Payer: Self-pay | Admitting: Hematology

## 2024-03-05 NOTE — Telephone Encounter (Addendum)
 Contacted patient via telephone call.  Went over provider's comments from below. Patient verbalized understanding. Transferred patient's call to the scheduling team/DB to further assist. Faxed Dr. Demetra most recent office visit note to patient's PCP, Dr. Alm Bilis T# [phone] F# [phone]. Confirmation received.   ----- Message from Onita Mattock sent at 03/03/2024  6:11 PM EDT ----- Please let pt know her ferritin is above the goal, and schedule phlebotomy in next month. Please send my note to her PCP so they can check her iron level on next visit, thanks  Onita Mattock  ----- Message ----- From: Rebecka, [facility] [place] Sent: 03/01/2024  12:36 PM EDT To: Onita Mattock, MD

## 2024-03-07 ENCOUNTER — Ambulatory Visit: Admitting: Occupational Therapy

## 2024-03-07 DIAGNOSIS — R2689 Other abnormalities of gait and mobility: Secondary | ICD-10-CM

## 2024-03-07 DIAGNOSIS — R278 Other lack of coordination: Secondary | ICD-10-CM

## 2024-03-07 DIAGNOSIS — R29818 Other symptoms and signs involving the nervous system: Secondary | ICD-10-CM

## 2024-03-07 DIAGNOSIS — M25621 Stiffness of right elbow, not elsewhere classified: Secondary | ICD-10-CM

## 2024-03-07 DIAGNOSIS — R2681 Unsteadiness on feet: Secondary | ICD-10-CM

## 2024-03-07 NOTE — Therapy (Unsigned)
 OUTPATIENT OCCUPATIONAL THERAPY [facility]  Treatment  Patient Name: Anita Schmidt MRN: [id number] DOB:[birth year], 68 y.o., female Today's Date: 03/08/2024  PCP: Dr. Pura REFERRING PROVIDER: Dr. Onita  END OF SESSION:  OT End of Session - 03/07/24 1558     Visit Number 4    Number of Visits 5    Date for OT Re-Evaluation 03/16/24    Authorization Type [employer] MCR    Authorization - Visit Number 4    Progress Note Due on Visit 10    OT Start Time 1534    OT Stop Time 1623    OT Time Calculation (min) 49 min            Past Medical History:  Diagnosis Date   Anxiety    severe   Anxiety disorder    Basal cell carcinoma 01/12/2011   BCC LEFT SIDE OF NOSE TX=MOHS   Fibrocystic breast disease    GERD (gastroesophageal reflux disease)    triggered with anxiety-not treated with meds   Gilbert's syndrome    Heart murmur    slight murmur dx by MD- no treatment-EKG clear   Hemochromatosis    High blood pressure    on meds   Hyperlipidemia    diet controlled   Skin cancer, basal cell    nose   Past Surgical History:  Procedure Laterality Date   BREAST BIOPSY Bilateral    COLONOSCOPY     colonscopy  2011   Tics ; Dr Jakie   DILATION AND CURETTAGE OF UTERUS     INGUINAL HERNIA REPAIR     MOHS SURGERY  2012   nose   TONSILLECTOMY     Tracer Pellets  2003   in breast; [facility]   WISDOM TOOTH EXTRACTION     Patient Active Problem List   Diagnosis Date Noted   Chronic left hip pain 02/09/2024   Hyperreflexia 05/31/2023   Idiopathic Parkinson's disease (HCC) 05/31/2023   Right leg weakness 01/07/2021   Weakness of right hip 01/07/2021   Affective disorder (HCC) 10/18/2019   Abnormal EKG 07/03/2018   Infraspinatus strain 04/20/2018   It band syndrome, left 04/20/2018   Cough 10/27/2017   Wheezing 10/27/2017   Anxiety 10/27/2017   Hyperglycemia 04/07/2017   Insect bite 04/07/2017   Cardiac murmur 12/29/2016   Pes anserine bursitis 10/19/2016   Scapular  dyskinesis 10/19/2016   Preventative health care 03/11/2016   Generalized anxiety disorder 05/24/2013   Benign essential hypertension 05/23/2013   Diverticulosis of colon without hemorrhage 04/27/2013   Malignant basal cell neoplasm of skin 04/27/2013   Hemochromatosis    Bee sting allergy 03/26/2011   IBS 02/10/2010   Anxiety state 12/25/2008   DISC DISEASE, CERVICAL 11/19/2008   Allergic rhinitis 09/19/2008   Hyperlipidemia 08/01/2008   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   MENOPAUSAL SYNDROME 08/01/2008   Personal history of disease 08/01/2008    ONSET DATE: 01/25/24- date of PD screen with needs identified  REFERRING DIAG: Parkinson's disease  THERAPY DIAG:  Other lack of coordination  Other symptoms and signs involving the nervous system  Unsteadiness on feet  Stiffness of right elbow, not elsewhere classified  Other abnormalities of gait and mobility  Rationale for Evaluation and Treatment: Rehabilitation  SUBJECTIVE:   SUBJECTIVE STATEMENT: Pt reports her hip pain is better  Pt accompanied by: self  PERTINENT HISTORY: Parkinsonism, recently diagnosed November 2024, however symptomatic with decreased arm swing for several years. PMHx of HTN, ostoporosis, anxiety   PRECAUTIONS:  None  WEIGHT BEARING RESTRICTIONS: No  PAIN:  Are you having pain? No  FALLS: Has patient fallen in last 6 months? No  LIVING ENVIRONMENT: Lives with: lives with their family and lives alone Lives in: House/apartment Stairs: internal  PLOF: Independent  PATIENT GOALS: maximize independence  OBJECTIVE:  Note: Objective measures were completed at Evaluation unless otherwise noted.  HAND DOMINANCE: Right  ADLs:mod I with all basic ADLS Overall ADLs: slower overall  Transfers/ambulation related to ADLs:mod I Eating: mod I Grooming: difficulty brushing teeth, slower with teeth bushing and putting away toothbrush, difficulty with applying lotion with RUE UB Dressing:  mod I however pt defers to LUE if in a hurry LB Dressing: slower with donning shoes Toileting: mod I Bathing: bathing with RUE, however it is slow and difficulty using RUE Tub Shower transfers: bathtub/ shower, mod I   IADLs: Pt report difficulty wiping with with a sponge, Pt is mod I with all home management, increased time required.  Handwriting: 100% legible  MOBILITY STATUS: Independent   FUNCTIONAL OUTCOME MEASURES:(from screen 01/25/24) Fastening/unfastening 3 buttons: 26.42 Physical performance test: PPT#2 (simulated eating) 8.51 & PPT#4 (donning/doffing jacket): 14.08  COORDINATION: (from screen 01/25/24)- Box & Blocks Test:   RUE  35 blocks bradykinesia (previously 49 blocks)      LUE  44  blocks  9-hole peg test:    RUE  28.81 sec        LUE  27.71 sec    UE ROM: RUE shoulder flexion WFLS, elbow extenion -10, supination/ pronation WFLS , decreased RUE wrist extension and pt maintains slight flexion at MP joints, LUE WFLS     SENSATION: WFL  MUSCLE TONE: RUE: Mild and Rigidity  COGNITION: Overall cognitive status: Within functional limits for tasks assessed  OBSERVATIONS: Bradykinesia                                                                                                                    TREATMENT DATE:  8/20/25Isolated IP flex and thumb circles (R over L) with focus on large amplitude with min cueing, handout from last visit issued. Dynamic step and reach to left and right sides while flippling playing cards emphasis on elbow extesnion and finger extension, mod v.c Standing to tass ball and perform category generation for cognitive component, followed by walking with big steps and arm swing while continueing task min-mod v.c for amplitude. In hand manipulation tasks with coins and mrbles, stacking coins, min v.c for flicks. Placing and removing grooved pegs from pegoboard with RUE, for increased fine motor coordiantion, min v.c Multi directional stepping  to following instructions for cognitive component. Lengthy discussion about benefits of appropriate PD medicine combined with exercise to maintain function, particularly of RUE. Pt is not interested in more or different medication at this time.  03/02/24:  Isolated IP flex and thumb circles (R over L) with focus on large amplitude with min-mod cueing.  Emphasized stretching hand in palm at MP and thumb CMC vs. IP to avoid  joint deformity and for improved positioning.    Standing, discussed importance and application of using large amplitude forward/backward wt. Shifts and side steps and then performed PWR! Step forward, and to each side as well as PWR! Rock for improved incr arm swing and transitional movements as well as functional reach.    Pt instructed in use of large amplitude movements for IADL tasks including putting hangers on rack, folding towels, wiping table, sweeping, and vacuuming.  Pt practiced use of large amplitude movements for these activities with min-mod cueing (verbal and demo).  Discussed benefit of appropriate dosage of medication as well as exercise dosage to decr risk future complications and slow functional changes.  Reviewed principles of exercise for neuroplasticity/brain change.  Also discussed various ways to incorporate therapy principles and HEP into daily/weekly schedule.  (Pt has check list from prior therapy).  Encouraged pt to pick at least 2 activities a day to deliberately incr effort and movement size and incorporate small movement breaks between other activities.  Emphasized large amplitude and not speed, particularly with walking for improved function and emphasize speed with cycling.    02/21/24- PWR! up, rock and twist in modified quadraped, 10 reps each min v.c  Avoided PWR! step due to hip pain PWR! hands basic 4, 10 reps each min v.c.  Flipping cards with RUE then both hands simultanosuly and dealing cards with big movments discussed ADLS, pt reports increased  time for bathing and hair washing, therapist recommends pt perfroms at night so she is not rushed in a.m.  Rotating a numerical block in RUE to locate a specific number for increased coordiantion, min v.c Discussed starting to wash hair with RUE then having LUE and emphasis on big movements. Wall slides with washcloth under hands and emphasis on RUE moving as BIG as left, 10 reps each, for shoulder flexion, abduction, circumduction each direction,  min v.c for amplitude. Stacking blocks with bilateral UE's simultaneously, min v.c   02/13/24 eval, see pt education   PATIENT EDUCATION: Education details: see above; instructed pt in isolated IP flex with MP ext and thumb circles- Person educated: Patient Education method: Explanation, Demonstration, and Verbal cues, Education comprehension: verbalized understanding, returned demonstration, verbal cues required,   HOME EXERCISE PROGRAM: 02/13/24- flipping and dealing playing cards, rotating ball, PWR! Hands 03/02/24- instructed pt in isolated IP flex with MP ext and thumb circles  GOALS: Goals reviewed with patient? Yes    LONG TERM GOALS: Target date: 03/16/24  I with HEP  Goal status met 03/07/24  2.  Pt will verbalize understanding of adapted strategies, AE to maximize I with ADLs/IADLs.  Goal status: ongoing , pt reports doing better with cutting food 03/07/24  3.  Pt will demonstrate improved functional use of RUE as evidenced by increasing box/ blocks score to 47 blocks or greater.  Goal status: ongoing, 02/22/24 4.Pt will report increased ease with using RUE for bathing and for wiping surfaces in kitchen   Goal status: met, 03/07/24  5. Pt will retrieve an item from [facility] shelf with -5 elbow extension RUE.  Goal status: ongoing 02/22/24   ASSESSMENT:  CLINICAL IMPRESSION: Patient is progressing towards goals. She reports increased ease with cutting food and washing her hair. PERFORMANCE DEFICITS: in functional skills  including ADLs, IADLs, coordination, dexterity, tone, ROM, strength, flexibility, Fine motor control, Gross motor control, mobility, balance, endurance, decreased knowledge of precautions, decreased knowledge of use of DME, and UE functional use, , and psychosocial skills including coping strategies,  environmental adaptation, habits, interpersonal interactions, and routines and behaviors.   IMPAIRMENTS: are limiting patient from ADLs, IADLs, rest and sleep, play, leisure, and social participation.   COMORBIDITIES:  may have co-morbidities  that affects occupational performance. Patient will benefit from skilled OT to address above impairments and improve overall function.  MODIFICATION OR ASSISTANCE TO COMPLETE EVALUATION: No modification of tasks or assist necessary to complete an evaluation.  OT OCCUPATIONAL PROFILE AND HISTORY: Detailed assessment: Review of records and additional review of physical, cognitive, psychosocial history related to current functional performance.  CLINICAL DECISION MAKING: LOW - limited treatment options, no task modification necessary  REHAB POTENTIAL: Good  EVALUATION COMPLEXITY: Low    PLAN:  OT FREQUENCY: 1x/week plus eval  OT DURATION: 5 weeks  PLANNED INTERVENTIONS: 97168 OT Re-evaluation, 97535 self care/ADL training, 02889 therapeutic exercise, 97530 therapeutic activity, 97112 neuromuscular re-education, 97140 manual therapy, 97116 gait training, 02886 aquatic therapy, 97035 ultrasound, 97018 paraffin, 02989 moist heat, 97750 Physical Performance Testing, passive range of motion, balance training, functional mobility training, psychosocial skills training, energy conservation, coping strategies training, patient/family education, and DME and/or AE instructions  RECOMMENDED OTHER SERVICES: n/a  CONSULTED AND AGREED WITH PLAN OF CARE: Patient  PLAN FOR NEXT SESSION: check goals anticipate d/c, look through previous exercises to prioritize consider  exercises flow sheet? Baily Serpe, OTR/L 03/08/2024, 8:14 AM

## 2024-03-07 NOTE — Patient Instructions (Signed)
 Flexor Tendon Gliding (Active Hook Fist)   With fingers and knuckles straight, bend middle and tip joints. Keep large knuckles straight. Repeat 10 times. Do 1 sessions per day.   Thumb Circles    Move right thumb in a circle. Around left thumb--keep left thumb still.   Repeat 10 times each direction per session. Do 1x/day.

## 2024-03-08 ENCOUNTER — Encounter: Payer: Self-pay | Admitting: Occupational Therapy

## 2024-03-12 ENCOUNTER — Ambulatory Visit: Admitting: Occupational Therapy

## 2024-03-12 ENCOUNTER — Encounter: Payer: Self-pay | Admitting: Occupational Therapy

## 2024-03-12 DIAGNOSIS — R29818 Other symptoms and signs involving the nervous system: Secondary | ICD-10-CM

## 2024-03-12 DIAGNOSIS — R278 Other lack of coordination: Secondary | ICD-10-CM

## 2024-03-12 DIAGNOSIS — R2681 Unsteadiness on feet: Secondary | ICD-10-CM

## 2024-03-12 DIAGNOSIS — M25621 Stiffness of right elbow, not elsewhere classified: Secondary | ICD-10-CM

## 2024-03-12 DIAGNOSIS — R2689 Other abnormalities of gait and mobility: Secondary | ICD-10-CM

## 2024-03-12 NOTE — Patient Instructions (Signed)
 SABRA

## 2024-03-12 NOTE — Therapy (Incomplete)
 OUTPATIENT OCCUPATIONAL THERAPY [facility]  Treatment  Patient Name: Anita Schmidt MRN: [id number] DOB:Mar 04, 1956, 68 y.o., female Today's Date: 03/13/2024  PCP: Dr. Pura REFERRING PROVIDER: Dr. Onita  OCCUPATIONAL THERAPY DISCHARGE SUMMARY    Current functional level related to goals / functional outcomes: Pt made excellent progress. She met all goals   Remaining deficits: rigidity, bradykinesia, decreased coordination   Education / Equipment: Pt was educated in the following: ways to incorportate BIG movements into ADLS, HEP, use of exercise flow sheet. She verbalizes understanding of all education.   Patient agrees to discharge. Patient goals were met. Patient is being discharged due to meeting the stated rehab goals. Pt can benefit from a PD screen in 6 mons..     END OF SESSION:  OT End of Session - 03/13/24 0902     Visit Number 5    Number of Visits 5    Date for OT Re-Evaluation 03/16/24    Authorization Type [employer] M[employer]    Authorization - Visit Number 5    Progress Note Due on Visit 10    OT Start Time 0804    OT Stop Time 0845    OT Time Calculation (min) 41 min             Past Medical History:  Diagnosis Date   Anxiety    severe   Anxiety disorder    Basal cell carcinoma 01/12/2011   BCC LEFT SIDE OF NOSE TX=MOHS   Fibrocystic breast disease    GERD (gastroesophageal reflux disease)    triggered with anxiety-not treated with meds   Gilbert's syndrome    Heart murmur    slight murmur dx by MD- no treatment-EKG clear   Hemochromatosis    High blood pressure    on meds   Hyperlipidemia    diet controlled   Skin cancer, basal cell    nose   Past Surgical History:  Procedure Laterality Date   BREAST BIOPSY Bilateral    COLONOSCOPY     colonscopy  2011   Tics ; Dr Jakie   DILATION AND CURETTAGE OF UTERUS     INGUINAL HERNIA REPAIR     MOHS SURGERY  2012   nose   TONSILLECTOMY     Tracer Pellets  2003   in breast; [facility]    WISDOM TOOTH EXTRACTION     Patient Active Problem List   Diagnosis Date Noted   Chronic left hip pain 02/09/2024   Hyperreflexia 05/31/2023   Idiopathic Parkinson's disease (HCC) 05/31/2023   Right leg weakness 01/07/2021   Weakness of right hip 01/07/2021   Affective disorder (HCC) 10/18/2019   Abnormal EKG 07/03/2018   Infraspinatus strain 04/20/2018   It band syndrome, left 04/20/2018   Cough 10/27/2017   Wheezing 10/27/2017   Anxiety 10/27/2017   Hyperglycemia 04/07/2017   Insect bite 04/07/2017   Cardiac murmur 12/29/2016   Pes anserine bursitis 10/19/2016   Scapular dyskinesis 10/19/2016   Preventative health care 03/11/2016   Generalized anxiety disorder 05/24/2013   Benign essential hypertension 05/23/2013   Diverticulosis of colon without hemorrhage 04/27/2013   Malignant basal cell neoplasm of skin 04/27/2013   Hemochromatosis    Bee sting allergy 03/26/2011   IBS 02/10/2010   Anxiety state 12/25/2008   DISC DISEASE, CERVICAL 11/19/2008   Allergic rhinitis 09/19/2008   Hyperlipidemia 08/01/2008   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   MENOPAUSAL SYNDROME 08/01/2008   Personal history of disease 08/01/2008    ONSET DATE: 01/25/24-  date of PD screen with needs identified  REFERRING DIAG: Parkinson's disease  THERAPY DIAG:  Other lack of coordination  Other symptoms and signs involving the nervous system  Unsteadiness on feet  Stiffness of right elbow, not elsewhere classified  Other abnormalities of gait and mobility  Rationale for Evaluation and Treatment: Rehabilitation  SUBJECTIVE:   SUBJECTIVE STATEMENT: Pt reports her hip pain is better overall  Pt accompanied by: self  PERTINENT HISTORY: Parkinsonism, recently diagnosed November 2024, however symptomatic with decreased arm swing for several years. PMHx of HTN, ostoporosis, anxiety   PRECAUTIONS: None  WEIGHT BEARING RESTRICTIONS: No  PAIN:  Are you having pain? No, none at rest  mild 3/10 with certain movements, avoiding  these movements prevents pain.  FALLS: Has patient fallen in last 6 months? No  LIVING ENVIRONMENT: Lives with: lives with their family and lives alone Lives in: House/apartment Stairs: internal  PLOF: Independent  PATIENT GOALS: maximize independence  OBJECTIVE:  Note: Objective measures were completed at Evaluation unless otherwise noted.  HAND DOMINANCE: Right  ADLs:mod I with all basic ADLS Overall ADLs: slower overall  Transfers/ambulation related to ADLs:mod I Eating: mod I Grooming: difficulty brushing teeth, slower with teeth bushing and putting away toothbrush, difficulty with applying lotion with RUE UB Dressing: mod I however pt defers to LUE if in a hurry LB Dressing: slower with donning shoes Toileting: mod I Bathing: bathing with RUE, however it is slow and difficulty using RUE Tub Shower transfers: bathtub/ shower, mod I   IADLs: Pt report difficulty wiping with with a sponge, Pt is mod I with all home management, increased time required.  Handwriting: 100% legible  MOBILITY STATUS: Independent   FUNCTIONAL OUTCOME MEASURES:(from screen 01/25/24) Fastening/unfastening 3 buttons: 26.42 Physical performance test: PPT#2 (simulated eating) 8.51 & PPT#4 (donning/doffing jacket): 14.08  COORDINATION: (from screen 01/25/24)- Box & Blocks Test:   RUE  35 blocks bradykinesia (previously 49 blocks)      LUE  44  blocks  9-hole peg test:    RUE  28.81 sec        LUE  27.71 sec    UE ROM: RUE shoulder flexion WFLS, elbow extenion -10, supination/ pronation WFLS , decreased RUE wrist extension and pt maintains slight flexion at MP joints, LUE WFLS     SENSATION: WFL  MUSCLE TONE: RUE: Mild and Rigidity  COGNITION: Overall cognitive status: Within functional limits for tasks assessed  OBSERVATIONS: Bradykinesia                                                                                                                     TREATMENT DATE:  03/12/24 Reviewed PWR! up, rock and twist in standing, avoided step as it provkes pain. Therapist reviewed exercise flow sheet that was previously issued and revised as needed so pt does not have too many activities in 1 day. Therapsit reviewed improtance of perfroming PWR! hands daily and including coordiantion activities at least 3x week. Therapist checked progress towards goals. Pt  agrees with a screen in 4 months.  8/20/25Isolated IP flex and thumb circles (R over L) with focus on large amplitude with min cueing, handout from last visit issued. Dynamic step and reach to left and right sides while flippling playing cards emphasis on elbow extesnion and finger extension, mod v.c Standing to tass ball and perform category generation for cognitive component, followed by walking with big steps and arm swing while continueing task min-mod v.c for amplitude. In hand manipulation tasks with coins and marbles, stacking coins, min v.c for flicks. Placing and removing grooved pegs from pegoboard with RUE, for increased fine motor coordiantion, min v.c Multi directional stepping to following instructions for cognitive component. Lengthy discussion about benefits of appropriate PD medicine combined with exercise to maintain function, particularly of RUE. Pt is not interested in more or different medication at this time.  03/02/24:  Isolated IP flex and thumb circles (R over L) with focus on large amplitude with min-mod cueing.  Emphasized stretching hand in palm at MP and thumb CMC vs. IP to avoid joint deformity and for improved positioning.    Standing, discussed importance and application of using large amplitude forward/backward wt. Shifts and side steps and then performed PWR! Step forward, and to each side as well as PWR! Rock for improved incr arm swing and transitional movements as well as functional reach.    Pt instructed in use of large amplitude movements for IADL tasks  including putting hangers on rack, folding towels, wiping table, sweeping, and vacuuming.  Pt practiced use of large amplitude movements for these activities with min-mod cueing (verbal and demo).  Discussed benefit of appropriate dosage of medication as well as exercise dosage to decr risk future complications and slow functional changes.  Reviewed principles of exercise for neuroplasticity/brain change.  Also discussed various ways to incorporate therapy principles and HEP into daily/weekly schedule.  (Pt has check list from prior therapy).  Encouraged pt to pick at least 2 activities a day to deliberately incr effort and movement size and incorporate small movement breaks between other activities.  Emphasized large amplitude and not speed, particularly with walking for improved function and emphasize speed with cycling.    02/21/24- PWR! up, rock and twist in modified quadraped, 10 reps each min v.c  Avoided PWR! step due to hip pain PWR! hands basic 4, 10 reps each min v.c.  Flipping cards with RUE then both hands simultanosuly and dealing cards with big movments discussed ADLS, pt reports increased time for bathing and hair washing, therapist recommends pt perfroms at night so she is not rushed in a.m.  Rotating a numerical block in RUE to locate a specific number for increased coordiantion, min v.c Discussed starting to wash hair with RUE then having LUE and emphasis on big movements. Wall slides with washcloth under hands and emphasis on RUE moving as BIG as left, 10 reps each, for shoulder flexion, abduction, circumduction each direction,  min v.c for amplitude. Stacking blocks with bilateral UE's simultaneously, min v.c   02/13/24 eval, see pt education   PATIENT EDUCATION: Education details: PWR standing for up, rock and twist, progress towards goals, PWR! hands basic 4,use of exercise flowsheet, ways to perfrom BIG movements with ADLS and importance of including RUE into activites. Person  educated: Patient Education method: Explanation, Demonstration, and Verbal cues, Education comprehension: verbalized understanding, returned demonstration, verbal cues required,   HOME EXERCISE PROGRAM: 02/13/24- flipping and dealing playing cards, rotating ball, PWR! Hands 03/02/24- instructed pt in  isolated IP flex with MP ext and thumb circles  GOALS: Goals reviewed with patient? Yes    LONG TERM GOALS: Target date: 03/16/24  I with HEP  Goal status met 03/07/24  2.  Pt will verbalize understanding of adapted strategies, AE to maximize I with ADLs/IADLs.  Goal status:met 03/12/24 doing better with washing hair,  pt reports doing better with cutting food met  3.  Pt will demonstrate improved functional use of RUE as evidenced by increasing box/ blocks score to 47 blocks or greater.  Goal status: met 53 03/12/24 4.Pt will report increased ease with using RUE for bathing and for wiping surfaces in kitchen   Goal status: met, 03/07/24  5. Pt will retrieve an item from [facility] shelf with -5 elbow extension RUE.  Goal status: met 0* 02/22/24   ASSESSMENT:  CLINICAL IMPRESSION: Patient made good overall progress towards goals. She achieved all long term goals. PERFORMANCE DEFICITS: in functional skills including ADLs, IADLs, coordination, dexterity, tone, ROM, strength, flexibility, Fine motor control, Gross motor control, mobility, balance, endurance, decreased knowledge of precautions, decreased knowledge of use of DME, and UE functional use, , and psychosocial skills including coping strategies, environmental adaptation, habits, interpersonal interactions, and routines and behaviors.   IMPAIRMENTS: are limiting patient from ADLs, IADLs, rest and sleep, play, leisure, and social participation.   COMORBIDITIES:  may have co-morbidities  that affects occupational performance. Patient will benefit from skilled OT to address above impairments and improve overall function.  MODIFICATION  OR ASSISTANCE TO COMPLETE EVALUATION: No modification of tasks or assist necessary to complete an evaluation.  OT OCCUPATIONAL PROFILE AND HISTORY: Detailed assessment: Review of records and additional review of physical, cognitive, psychosocial history related to current functional performance.  CLINICAL DECISION MAKING: LOW - limited treatment options, no task modification necessary  REHAB POTENTIAL: Good  EVALUATION COMPLEXITY: Low    PLAN:  OT FREQUENCY: 1x/week plus eval  OT DURATION: 5 weeks  PLANNED INTERVENTIONS: 97168 OT Re-evaluation, 97535 self care/ADL training, 02889 therapeutic exercise, 97530 therapeutic activity, 97112 neuromuscular re-education, 97140 manual therapy, 97116 gait training, 02886 aquatic therapy, 97035 ultrasound, 97018 paraffin, 02989 moist heat, 97750 Physical Performance Testing, passive range of motion, balance training, functional mobility training, psychosocial skills training, energy conservation, coping strategies training, patient/family education, and DME and/or AE instructions  RECOMMENDED OTHER SERVICES: n/a  CONSULTED AND AGREED WITH PLAN OF CARE: Patient  PLAN FOR NEXT SESSION: d/c OT, PD sceen in 6 mons Takeysha Bonk, OTR/L 03/13/2024, 9:04 AM

## 2024-03-13 ENCOUNTER — Encounter: Payer: Self-pay | Admitting: Occupational Therapy

## 2024-04-06 ENCOUNTER — Other Ambulatory Visit: Payer: Self-pay

## 2024-04-06 ENCOUNTER — Inpatient Hospital Stay: Attending: Hematology

## 2024-04-06 ENCOUNTER — Inpatient Hospital Stay

## 2024-04-06 NOTE — Progress Notes (Signed)
 Anita Schmidt presents today for phlebotomy per MD orders. Phlebotomy procedure started at 1138 and ended at 1144. 500 grams removed with phleb kit 16g L AC Pt reported to RN she brought a small amount of wine with her to ease her nerves. Pt was educated about not bringing alcohol with her and open container laws. Pt verbalized understanding and states she feels ok to drive and that she only took 3 small sips. Pt did not show any signs of slurred speech and ambulated normally, pt also had several snacks.  Patient observed for 30 minutes after procedure. Patient tolerated procedure well. IV needle removed intact.

## 2024-04-06 NOTE — Patient Instructions (Signed)

## 2024-05-21 ENCOUNTER — Encounter: Payer: Self-pay | Admitting: Radiology

## 2024-07-04 ENCOUNTER — Ambulatory Visit: Admitting: Physical Therapy

## 2024-07-04 ENCOUNTER — Ambulatory Visit: Admitting: Occupational Therapy

## 2024-07-04 DIAGNOSIS — G20B2 Parkinson's disease with dyskinesia, with fluctuations: Secondary | ICD-10-CM | POA: Insufficient documentation

## 2024-07-04 DIAGNOSIS — R278 Other lack of coordination: Secondary | ICD-10-CM | POA: Insufficient documentation

## 2024-07-04 DIAGNOSIS — G20A1 Parkinson's disease without dyskinesia, without mention of fluctuations: Secondary | ICD-10-CM

## 2024-07-04 NOTE — Therapy (Signed)
 Occupational Therapy Parkinson's Disease Screen  Hand dominance:  RUE   Physical Performance Test item #2 (simulated eating):  15.63  sec  Fastening/unfastening 3 buttons in:  27.93sec  9-hole peg test:    RUE  31.81 sec bradykinesia noted       LUE  24.98 sec  Box & Blocks Test:   RUE  46 blocks        LUE  57 blocks    Change in ability to perform ADLs/IADLs: Pt reports her hand is stiffer. R hand flexed at rest    Pt would benefit from occupational therapy evaluation due to  decline in RUE coordiantion and functional use Haly Feher, OTR/L 11:44 AM 07/04/2024

## 2024-07-04 NOTE — Therapy (Signed)
 OUTPATIENT PHYSICAL THERAPY NEURO TREATMENT   Patient Name: Anita Schmidt MRN: [id number] DOB:1955/12/04, 68 y.o., female Today's Date: 07/04/2024   PCP: Pura Lenis, MD REFERRING PROVIDER: Onita Duos, MD   END OF SESSION:    Past Medical History:  Diagnosis Date   Anxiety    severe   Anxiety disorder    Basal cell carcinoma 01/12/2011   BCC LEFT SIDE OF NOSE TX=MOHS   Fibrocystic breast disease    GERD (gastroesophageal reflux disease)    triggered with anxiety-not treated with meds   Gilbert's syndrome    Heart murmur    slight murmur dx by MD- no treatment-EKG clear   Hemochromatosis    High blood pressure    on meds   Hyperlipidemia    diet controlled   Skin cancer, basal cell    nose   Past Surgical History:  Procedure Laterality Date   BREAST BIOPSY Bilateral    COLONOSCOPY     colonscopy  2011   Tics ; Dr Jakie   DILATION AND CURETTAGE OF UTERUS     INGUINAL HERNIA REPAIR     MOHS SURGERY  2012   nose   TONSILLECTOMY     Tracer Pellets  2003   in breast; [facility]   WISDOM TOOTH EXTRACTION     Patient Active Problem List   Diagnosis Date Noted   Chronic left hip pain 02/09/2024   Hyperreflexia 05/31/2023   Idiopathic Parkinson's disease (HCC) 05/31/2023   Right leg weakness 01/07/2021   Weakness of right hip 01/07/2021   Affective disorder 10/18/2019   Abnormal EKG 07/03/2018   Infraspinatus strain 04/20/2018   It band syndrome, left 04/20/2018   Cough 10/27/2017   Wheezing 10/27/2017   Anxiety 10/27/2017   Hyperglycemia 04/07/2017   Insect bite 04/07/2017   Cardiac murmur 12/29/2016   Pes anserine bursitis 10/19/2016   Scapular dyskinesis 10/19/2016   Preventative health care 03/11/2016   Generalized anxiety disorder 05/24/2013   Benign essential hypertension 05/23/2013   Diverticulosis of colon without hemorrhage 04/27/2013   Malignant basal cell neoplasm of skin 04/27/2013   Hemochromatosis    Bee sting allergy 03/26/2011    IBS 02/10/2010   Anxiety state 12/25/2008   DISC DISEASE, CERVICAL 11/19/2008   Allergic rhinitis 09/19/2008   Hyperlipidemia 08/01/2008   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  Rationale for Evaluation and Treatment: Rehabilitation  SUBJECTIVE:  SUBJECTIVE STATEMENT: Patient reports that her knee and hip have been good since the last visit, has questions regarding HEP and movements with PD  Pt accompanied by: self  PERTINENT HISTORY:  Per referring phaysican note: Anita Schmidt is a 68 y.o. 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 Yes: NPRS scale: patient did not state. Pain location: R shoulder Pain description: N/A Aggravating factors: certain motions Relieving factors: avoiding the aggravating movements.  PRECAUTIONS: Fall  RED FLAGS: None   WEIGHT BEARING RESTRICTIONS: No  FALLS: Has patient fallen in last 6 months? No  LIVING ENVIRONMENT: Lives with: lives alone Lives in: House/apartment Stairs: Yes: Internal: 14 steps; on left going up and External: 4 steps; bilateral but cannot reach both Has following equipment at home:  None  PLOF: Independent  PATIENT GOALS: Patient would like to identify any problems, Parkinson's education to minimize the impact on her daily life.  OBJECTIVE:  Note: Objective measures were completed at Evaluation unless otherwise noted.  DIAGNOSTIC FINDINGS:  Cerv CT 12/24 IMPRESSION: 1. No acute intracranial process. 2. No acute fracture or traumatic listhesis in the cervical spine. 3. Multilevel degenerative changes in the cervical spine, with mild-to-moderate spinal canal stenosis at C5-C6 and mild spinal canal stenosis at C6-C7. 4. Multilevel neural foraminal narrowing, severe on the right at C5-C6 and moderate bilaterally at C3-C4 and on the left at C5-C6. 5. 1.9 cm hypodense lesion in the right thyroid  lobe, with areas of calcification. If this has not previously been evaluated, a non-emergent ultrasound of the thyroid  is recommended. (Reference: J Am Coll Radiol. 2015 Feb;12(2): 143-50) 6. 7 mm ground-glass nodule in the right lung. Initial follow-up with CT at 6 months is recommended to confirm persistence. If persistent, repeat CT is recommended every 2 years until 5 years of stability has been established.  COGNITION: Overall cognitive status: Within functional limits for tasks assessed   SENSATION: Patient denies any change  COORDINATION: Heel on shin WNL  EDEMA:  swelling  MUSCLE TONE: no rigidity noted  MUSCLE LENGTH: Hamstrings: Right 85 deg; Left 75 deg Thomas test: Mildly tight  POSTURE: rounded shoulders and R scapula winging, patient reports spinal curvature  LOWER EXTREMITY ROM:  WNL B    LOWER EXTREMITY MMT:  5/5 BLE  BED MOBILITY:  I  TRANSFERS: I RAMP:  I  CURB:  I  STAIRS: Level of Assistance: Modified independence Stair Negotiation Technique: Step to Pattern with Single Rail on Left Number of Stairs: 14  Comments: Patient reports that she does fine on steps as long as she uses a rail.  GAIT: Gait pattern: step through  pattern, decreased arm swing- Right, decreased arm swing- Left, decreased step length- Right, and decreased step length- Left Distance walked: In clinic distances Assistive device utilized: None Level of assistance: Complete Independence Comments: Patient reports that she tries to focus on longer  steps.  FUNCTIONAL TESTS:  5 times sit to stand: 11.69 Timed up and go (TUG): 8.24, cog 8.84, dual 9.37 Functional gait assessment: 24/30  09/28/23 28/30                                                                                                                             TREATMENT DATE:  07/04/24 PT Screen Reports doing well no issues except for some left hip pain, she is tight in the piriformis and adductors 5XSTS 9 seconds TUG 7 seconds  01/25/24 PT SCREEN TUG 7.80s 5xSTS 8.73s FGA 28/30  09/28/23 Elliptical level 3 x 3 minutes Gait outside around the back building cues for right arm swing Reviewed HEP for stretching and for PWR moves, also reviewed walking program, gym program and things that may be good for her to do, she also goes to classes Passive stretch of the LE's  09/21/23 Elliptical 3 minutes level 3 PWR Moves in standing On bosu reaching out of BOS Step turn touch Step and reach Opposite elbow to opposite knee then hand to heel in front Supine feet on ball K2C, rotation, bridge and iso abs Passive stretch of the LE's STM with Tgun to the left hip flexor and quad  09/14/23 Gait around the back building 2 laps brisk pace, cues for right arm swing and right hand open Walking ball toss Side step on and off airex On airex volley ball On airex 10# alternating arm pulls On airex 5# alternating punches On upside down bosu reaching, ball toss 40# resisted gait all directions Passive stretch left hip all motions  08/25/23 Ambulation outside around the back building 1 lap fast pace On bosu reaching for numbers on wall On bosu head turns On airex ball toss Step turn  touch PWR moves rotation Step volley ball 4 step with cues Tmill push fwd and backward Small jumps side to side, front to back  Passive LE stretches  08/23/23 Ambulated brisk pace around the back building x 2 some cues for right arm swing Back building stairs 2 full flights step over step On bosu two ways ball toss, volley ball On wobble board balance On bosu 2 ways reaching for numbers on wall with some math Skipping, power skipping LE stretches  08/18/23 Ambulated outdoors on unlevel surfaces, up and down curbs, fast walking speed, up and down inclines, no unsteadiness noted. Alternating cone taps while standing on airex pad, progressed to multiple taps, then taps with rotation.  1 foot on stool, rolling stool side to side with rotation in SLS. 4 square stepping, first clockwise, then counter clockwise, then changing directions unexpectedly. She got caught leaning the wrong way multiple times, but was able to adjust with no LOB. Heel rise, toes raise on airex plank, able to shift through full range.  08/16/23 Bike L4 x 4 min warm up AR with 5#, 1 x 10 reps each side Ball on wall, UUE, 5x each direction, x 2  Standing on upside down BOSU, static, lateral weight shifts, ant/post weight shifts, clock face. B side step onto and off airex pad with wide steps, x 10 each direction. B side step on airex plank x 5 each way. SLS-forward I with arms overhead, x 10 on each leg B side step against G tband 5 x 8 steps in each direction, band started at knees, but moved to mid shin to increase resistance.  08/10/23:  Nustep level 5, 5 min 30 sec, LE's only, did not like the UE movement, too abrupt Reviewed and practiced the PWR moves that were provided last session.  She performed well.  We discussed performing in front of mirror to monitor her position particularly for R arm Supine for manual stretching for B hamstrings 60 sec holds, once each leg Prone for R shoulder horizontal abd palm  towards floor for engaging posterior shoulder musculature Practiced gait stepping over different height boxes, initially she tended to sling her leg around the box but corrected easily with instruction.  Single leg stance, able to stand for over 30 sec each foot.  PATIENT EDUCATION: Education details: POC Person educated: Patient Education method: Explanation Education comprehension: verbalized understanding  HOME EXERCISE PROGRAM: Standing PWR moves 08/09/23  GOALS: Goals reviewed with patient? Yes  SHORT TERM GOALS: Target date: 08/16/23  I with initial HEP Baseline: Goal status: 08/15/22-Provided patient with written PWR move instructions for standing activities, met  LONG TERM GOALS: Target date: 10/05/23  I with final HEP Baseline:  Goal status: met 09/28/23  2.  Patient will score 28/30 on FGA to demonstrate decreased fall risk Baseline:  Goal status: met 09/28/23  3.  Patient will achieve 100% on M-CTSIB Baseline:  Goal status: 08/09/23-passed screen, met  4.  Patient will demonstrate normalized gait pattern while ambulating at least 800' on level and unlevel surfaces including normalized step length, no shuffling or festination. I gait Baseline:  Goal status: met 09/28/23  5.  Patient will demonstrate the ability to climb up or down 12 steps going step over step, no UE support Baseline:  Goal status: met 09/28/23  6.  Patient will identify appropriate activities and the appropriate balance of activities to address her Parkinson's with living her daily life.  Baseline: Currently reports that her treatment activities are a bit overwhelming. Goal status: met 09/28/23  ASSESSMENT:  CLINICAL IMPRESSION: PT Parkinson's screen.  Kimanh continues to do well, she is having some left hip pain, she reports OA. I gave her a few stretches and asked her to do lightly.  Overall she is doing well. She is going to Pilates.  He functional tests are where they were in July.  We will not  pick up but she will be seeing OT here   OBJECTIVE IMPAIRMENTS: Abnormal gait, decreased activity tolerance, decreased balance, decreased coordination, decreased endurance, difficulty walking, decreased ROM, decreased strength, impaired flexibility, and impaired tone.   ACTIVITY LIMITATIONS: carrying, lifting, bending, standing, squatting, stairs, transfers, and locomotion level  PARTICIPATION LIMITATIONS: meal prep, cleaning, laundry, driving, shopping, and community activity  PERSONAL FACTORS: Past/current experiences are also affecting patient's functional outcome.   REHAB POTENTIAL: Good  CLINICAL DECISION MAKING: Evolving/moderate complexity  EVALUATION COMPLEXITY: Moderate  PLAN:  PT FREQUENCY: 2x/week  PT DURATION: 10 weeks  PLANNED INTERVENTIONS: 97110-Therapeutic exercises, 97530- Therapeutic activity, [id number]- Neuromuscular re-education, 97535- Self Care, [id number]- Manual therapy, [phone]- Gait training, Patient/Family education, Balance training, Dry Needling, Joint mobilization, Spinal mobilization, Cryotherapy, and Moist heat  PLAN FOR NEXT SESSION:  D/C goals met  Almetta Fam, PT, DPT 07/04/2024 11:00 AM

## 2024-07-05 DIAGNOSIS — G20B2 Parkinson's disease with dyskinesia, with fluctuations: Secondary | ICD-10-CM

## 2024-07-05 DIAGNOSIS — R278 Other lack of coordination: Secondary | ICD-10-CM | POA: Insufficient documentation

## 2024-07-05 NOTE — Telephone Encounter (Signed)
Orders Placed This Encounter  ?Procedures  ? Ambulatory referral to Occupational Therapy  ? ?   ?

## 2024-07-05 NOTE — Telephone Encounter (Addendum)
 Dr. Onita, Devere Dibbles was seen for a Parkinson's screen.  She presents with a decline in RUE coordination.  She can benefit from an occupational therapy evaluation. If you agree please place a referral for OT to the following location:  [facility] [facility] [place], [place], [address] Phone: [phone]   Fax:  [phone]  Best regards, Lamarr Brought, OTR/L

## 2024-07-13 ENCOUNTER — Encounter: Payer: Self-pay | Admitting: Hematology

## 2024-07-17 ENCOUNTER — Other Ambulatory Visit: Payer: Self-pay | Admitting: Neurology

## 2024-07-17 NOTE — Telephone Encounter (Signed)
 Last seen on 11/28/23 Follow up scheduled on 12/03/24    Dispensed Days Supply Quantity Provider Pharmacy  RASAGILINE    TAB 0.5MG  07/16/2024 90 90 tablet Onita Duos, MD [facility] #...      Rx just filled on 07/16/24, Rx denied.

## 2024-07-31 ENCOUNTER — Ambulatory Visit: Admitting: Occupational Therapy

## 2024-07-31 NOTE — Therapy (Incomplete)
 " OUTPATIENT OCCUPATIONAL THERAPY NEURO EVALUATION  Patient Name: Anita Schmidt MRN: [id number] DOB:[birth year], 69 y.o., female Today's Date: 07/31/2024  PCP: *** REFERRING PROVIDER: ***  END OF SESSION:   Past Medical History:  Diagnosis Date   Anxiety    severe   Anxiety disorder    Basal cell carcinoma 01/12/2011   BCC LEFT SIDE OF NOSE TX=MOHS   Fibrocystic breast disease    GERD (gastroesophageal reflux disease)    triggered with anxiety-not treated with meds   Gilbert's syndrome    Heart murmur    slight murmur dx by MD- no treatment-EKG clear   Hemochromatosis    High blood pressure    on meds   Hyperlipidemia    diet controlled   Skin cancer, basal cell    nose   Past Surgical History:  Procedure Laterality Date   BREAST BIOPSY Bilateral    COLONOSCOPY     colonscopy  2011   Tics ; Dr Jakie   DILATION AND CURETTAGE OF UTERUS     INGUINAL HERNIA REPAIR     MOHS SURGERY  2012   nose   TONSILLECTOMY     Tracer Pellets  2003   in breast; [facility]   WISDOM TOOTH EXTRACTION     Patient Active Problem List   Diagnosis Date Noted   Other lack of coordination 07/05/2024   Chronic left hip pain 02/09/2024   Hyperreflexia 05/31/2023   Parkinson's disease with dyskinesia, with fluctuations (HCC) 05/31/2023   Right leg weakness 01/07/2021   Weakness of right hip 01/07/2021   Affective disorder 10/18/2019   Abnormal EKG 07/03/2018   Infraspinatus strain 04/20/2018   It band syndrome, left 04/20/2018   Cough 10/27/2017   Wheezing 10/27/2017   Anxiety 10/27/2017   Hyperglycemia 04/07/2017   Insect bite 04/07/2017   Cardiac murmur 12/29/2016   Pes anserine bursitis 10/19/2016   Scapular dyskinesis 10/19/2016   Preventative health care 03/11/2016   Generalized anxiety disorder 05/24/2013   Benign essential hypertension 05/23/2013   Diverticulosis of colon without hemorrhage 04/27/2013   Malignant basal cell neoplasm of skin 04/27/2013   Hemochromatosis     Bee sting allergy 03/26/2011   IBS 02/10/2010   Anxiety state 12/25/2008   DISC DISEASE, CERVICAL 11/19/2008   Allergic rhinitis 09/19/2008   Hyperlipidemia 08/01/2008   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  Rationale for Evaluation and Treatment: {HABREHAB:27488}  SUBJECTIVE:   SUBJECTIVE STATEMENT: *** Pt accompanied by: {accompnied:27141}  PERTINENT HISTORY: ***  PRECAUTIONS: {Therapy precautions:24002}  WEIGHT BEARING RESTRICTIONS: {Yes ***/No:24003}  PAIN:  Are you having pain? {OPRCPAIN:27236}  FALLS: Has patient fallen in last 6 months? {fallsyesno:27318}  LIVING ENVIRONMENT: Lives with: {OPRC lives with:25569::lives with their family} Lives in: {Lives in:25570} Stairs: {opstairs:27293} Has following equipment at home: {Assistive devices:23999}  PLOF: {PLOF:24004}  PATIENT GOALS: ***  OBJECTIVE:  Note: Objective measures were completed at Evaluation unless otherwise noted.  HAND DOMINANCE: {MISC; OT HAND DOMINANCE:[id number]}  ADLs: Overall ADLs: *** Transfers/ambulation related to ADLs: Eating: *** Grooming: *** UB Dressing: *** LB Dressing: *** Toileting: *** Bathing: *** Tub Shower transfers: *** Equipment: {equipment:25573}  IADLs: Shopping: *** Light housekeeping: *** Meal Prep: *** Community mobility: *** Medication management: *** Financial management: *** Handwriting: {OTWRITTENEXPRESSION:25361}  MOBILITY STATUS: {OTMOBILITY:25360}  POSTURE COMMENTS:  {posture:25561} Sitting balance: {sitting balance:25483}  ACTIVITY  TOLERANCE: Activity tolerance: ***  FUNCTIONAL OUTCOME MEASURES: {OTFUNCTIONALMEASURES:27238}  UPPER EXTREMITY ROM:    {AROM/PROM:27142} ROM Right eval Left eval  Shoulder flexion    Shoulder abduction    Shoulder adduction    Shoulder  extension    Shoulder internal rotation    Shoulder external rotation    Elbow flexion    Elbow extension    Wrist flexion    Wrist extension    Wrist ulnar deviation    Wrist radial deviation    Wrist pronation    Wrist supination    (Blank rows = not tested)  UPPER EXTREMITY MMT:     MMT Right eval Left eval  Shoulder flexion    Shoulder abduction    Shoulder adduction    Shoulder extension    Shoulder internal rotation    Shoulder external rotation    Middle trapezius    Lower trapezius    Elbow flexion    Elbow extension    Wrist flexion    Wrist extension    Wrist ulnar deviation    Wrist radial deviation    Wrist pronation    Wrist supination    (Blank rows = not tested)  HAND FUNCTION: {handfunction:27230}  COORDINATION: {otcoordination:27237}  SENSATION: {sensation:27233}  EDEMA: ***  MUSCLE TONE: {UETONE:25567}  COGNITION: Overall cognitive status: {cognition:24006}  VISION: Subjective report: *** Baseline vision: {OTBASELINEVISION:25363} Visual history: {OTVISUALHISTORY:25364}  VISION ASSESSMENT: {visionassessment:27231}  Patient has difficulty with following activities due to following visual impairments: ***  PERCEPTION: {Perception:25564}  PRAXIS: {Praxis:25565}  OBSERVATIONS: ***                                                                                                                             TREATMENT DATE: ***         PATIENT EDUCATION: Education details: *** Person educated: {Person educated:25204} Education method: {Education Method:25205} Education comprehension: {Education Comprehension:25206}  HOME EXERCISE PROGRAM: ***   GOALS: Goals reviewed with patient? {yes/no:20286}  SHORT TERM GOALS: Target date: ***  *** Baseline: Goal status: INITIAL  2.  *** Baseline:  Goal status: INITIAL  3.  *** Baseline:  Goal status: INITIAL  4.  *** Baseline:  Goal status: INITIAL  5.   *** Baseline:  Goal status: INITIAL  6.  *** Baseline:  Goal status: INITIAL  LONG TERM GOALS: Target date: ***  *** Baseline:  Goal status: INITIAL  2.  *** Baseline:  Goal status: INITIAL  3.  *** Baseline:  Goal status: INITIAL  4.  *** Baseline:  Goal status: INITIAL  5.  *** Baseline:  Goal status: INITIAL  6.  *** Baseline:  Goal status: INITIAL  ASSESSMENT:  CLINICAL IMPRESSION: Patient is a *** y.o. *** who was seen today for occupational therapy evaluation for ***.   PERFORMANCE DEFICITS: in functional skills including {OT physical skills:25468}, cognitive skills including {OT cognitive skills:25469}, and psychosocial skills including {OT psychosocial skills:25470}.   IMPAIRMENTS: are limiting patient  from {OT performance deficits:25471}.   CO-MORBIDITIES: {Comorbidities:25485} that affects occupational performance. Patient will benefit from skilled OT to address above impairments and improve overall function.  MODIFICATION OR ASSISTANCE TO COMPLETE EVALUATION: {OT modification:25474}  OT OCCUPATIONAL PROFILE AND HISTORY: {OT PROFILE AND HISTORY:25484}  CLINICAL DECISION MAKING: {OT CDM:25475}  REHAB POTENTIAL: {rehabpotential:25112}  EVALUATION COMPLEXITY: {Evaluation complexity:25115}    PLAN:  OT FREQUENCY: {rehab frequency:25116}  OT DURATION: {rehab duration:25117}  PLANNED INTERVENTIONS: {OT Interventions:25467}  RECOMMENDED OTHER SERVICES: ***  CONSULTED AND AGREED WITH PLAN OF CARE: {ENR:74513}  PLAN FOR NEXT SESSION: ***   FREDERICO COLLAR, OT 07/31/2024, 12:20 PM           "

## 2024-08-01 ENCOUNTER — Encounter: Payer: Self-pay | Admitting: Occupational Therapy

## 2024-08-01 ENCOUNTER — Other Ambulatory Visit: Payer: Self-pay

## 2024-08-01 ENCOUNTER — Ambulatory Visit: Attending: Neurology | Admitting: Occupational Therapy

## 2024-08-01 DIAGNOSIS — G20B2 Parkinson's disease with dyskinesia, with fluctuations: Secondary | ICD-10-CM | POA: Insufficient documentation

## 2024-08-01 DIAGNOSIS — R2689 Other abnormalities of gait and mobility: Secondary | ICD-10-CM | POA: Insufficient documentation

## 2024-08-01 DIAGNOSIS — R278 Other lack of coordination: Secondary | ICD-10-CM | POA: Diagnosis present

## 2024-08-01 DIAGNOSIS — G20A1 Parkinson's disease without dyskinesia, without mention of fluctuations: Secondary | ICD-10-CM | POA: Insufficient documentation

## 2024-08-01 DIAGNOSIS — R2681 Unsteadiness on feet: Secondary | ICD-10-CM | POA: Diagnosis present

## 2024-08-01 DIAGNOSIS — R29818 Other symptoms and signs involving the nervous system: Secondary | ICD-10-CM | POA: Insufficient documentation

## 2024-08-01 DIAGNOSIS — M25621 Stiffness of right elbow, not elsewhere classified: Secondary | ICD-10-CM | POA: Insufficient documentation

## 2024-08-01 NOTE — Therapy (Signed)
 " OUTPATIENT OCCUPATIONAL THERAPY [facility] EVALUATION  Patient Name: Anita Schmidt MRN: [id number] DOB:April 07, 1956, 69 y.o., female Today's Date: 08/01/2024  PCP: Dr. Pura REFERRING PROVIDER: Dr. Onita  END OF SESSION:  OT End of Session - 08/01/24 0850     Visit Number 1    Number of Visits 12    Date for Recertification  10/24/24    Authorization Type [employer] MCR    Authorization Time Period 12 weeks    Authorization - Visit Number 1    OT Start Time 0843    OT Stop Time 0925    OT Time Calculation (min) 42 min    Activity Tolerance Patient tolerated treatment well    Behavior During Therapy WFL for tasks assessed/performed          Past Medical History:  Diagnosis Date   Anxiety    severe   Anxiety disorder    Basal cell carcinoma 01/12/2011   BCC LEFT SIDE OF NOSE TX=MOHS   Fibrocystic breast disease    GERD (gastroesophageal reflux disease)    triggered with anxiety-not treated with meds   Gilbert's syndrome    Heart murmur    slight murmur dx by MD- no treatment-EKG clear   Hemochromatosis    High blood pressure    on meds   Hyperlipidemia    diet controlled   Skin cancer, basal cell    nose   Past Surgical History:  Procedure Laterality Date   BREAST BIOPSY Bilateral    COLONOSCOPY     colonscopy  2011   Tics ; Dr Jakie   DILATION AND CURETTAGE OF UTERUS     INGUINAL HERNIA REPAIR     MOHS SURGERY  2012   nose   TONSILLECTOMY     Tracer Pellets  2003   in breast; [facility]   WISDOM TOOTH EXTRACTION     Patient Active Problem List   Diagnosis Date Noted   Other lack of coordination 07/05/2024   Chronic left hip pain 02/09/2024   Hyperreflexia 05/31/2023   Parkinson's disease with dyskinesia, with fluctuations (HCC) 05/31/2023   Right leg weakness 01/07/2021   Weakness of right hip 01/07/2021   Affective disorder 10/18/2019   Abnormal EKG 07/03/2018   Infraspinatus strain 04/20/2018   It band syndrome, left 04/20/2018   Cough  10/27/2017   Wheezing 10/27/2017   Anxiety 10/27/2017   Hyperglycemia 04/07/2017   Insect bite 04/07/2017   Cardiac murmur 12/29/2016   Pes anserine bursitis 10/19/2016   Scapular dyskinesis 10/19/2016   Preventative health care 03/11/2016   Generalized anxiety disorder 05/24/2013   Benign essential hypertension 05/23/2013   Diverticulosis of colon without hemorrhage 04/27/2013   Malignant basal cell neoplasm of skin 04/27/2013   Hemochromatosis    Bee sting allergy 03/26/2011   IBS 02/10/2010   Anxiety state 12/25/2008   DISC DISEASE, CERVICAL 11/19/2008   Allergic rhinitis 09/19/2008   Hyperlipidemia 08/01/2008   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   MENOPAUSAL SYNDROME 08/01/2008   Personal history of disease 08/01/2008    ONSET DATE: 07/05/25- referral date  REFERRING DIAG:  Diagnosis  G20.B2 (ICD-10-CM) - Parkinson's disease with dyskinesia, with fluctuations (HCC)  R27.8 (ICD-10-CM) - Other lack of coordination    THERAPY DIAG:  Other lack of coordination  Parkinson's disease without dyskinesia, unspecified whether manifestations fluctuate (HCC)  Other symptoms and signs involving the nervous system  Unsteadiness on feet  Stiffness of right elbow, not elsewhere classified  Other abnormalities of gait and  mobility  Rationale for Evaluation and Treatment: Rehabilitation  SUBJECTIVE:   SUBJECTIVE STATEMENT: Pt reports she wants to keep right hand form closing up Pt accompanied by: self  PERTINENT HISTORY: Parkinsonism, recently diagnosed November 2024, however symptomatic with decreased arm swing for several years. PMHx of HTN, ostoporosis, anxiety   PRECAUTIONS: None and Fall  WEIGHT BEARING RESTRICTIONS: No  PAIN:  Are you having pain? No  FALLS: Has patient fallen in last 6 months? No  LIVING ENVIRONMENT: Lives with: lives with their family and lives alone Lives in: House/apartment   PLOF: Independent  PATIENT GOALS: To keep right  hand from closing up  OBJECTIVE:  Note: Objective measures were completed at Evaluation unless otherwise noted.  HAND DOMINANCE: Right  ADLs:mod I with all basic ADLs.  Transfers/ambulation related to ADLs:mod I Eating: mod I uses RUE 50%x Grooming: brushing teeth uses RUE 50% x UB Dressing: mod I LB Dressing: mod I Toileting: mod I Bathing: mod I Tub Shower transfers: mod I   IADLs:mod I with IADLs, difficulty chopping vegetables, peeling and cutting, difficulty opening a jar. Pt reports increased difficulty with heavioer housework and laundry, Pt rpeorts every thing is slower  Handwriting: Pt reports micrographia after writing for a while, pt wrote a sentence with good legibility and letter size.  MOBILITY STATUS: Independent, decreased R arm swing    FUNCTIONAL OUTCOME MEASURES: UEFI-66/80  Fastening/unfastening 3 buttons: 25.09 secs using primarily LUE  PPT#2( eating)-14.04 secs, PPT#4 don/ doff jacket 11.23 secs COORDINATION: From screen 07/04/24: 9-hole peg test:    RUE  31.81 sec bradykinesia noted       LUE  24.98 sec   Box & Blocks Test:   RUE  46 blocks       LUE  57 blocks   UE ROM:  Bilateral shoulder flexion WFLS, RUE elbow extension:-5, Pt maintains MP joints in flexion at rest, needs v.c for extension.    SENSATION: WFL  MUSCLE TONE: RUE: Mild and Rigidity  COGNITION: Overall cognitive status: Within functional limits for tasks assessed  OBSERVATIONS: Bradykinesia                                                                                                                    TREATMENT DATE: 08/01/24- eval, see pt education    PATIENT EDUCATION: Education details: role of OT, potential goals, recommendations for R hand extension at rest, donning/ doffing jacket with big movments, importance of forced use of RUE to maintain function Person educated: Patient Education method: Explanation, demonstration Education comprehension: verbalized  understanding, returned demonstration  HOME EXERCISE PROGRAM: n/a  GOALS: Goals reviewed with patient? Yes  SHORT TERM GOALS: Target date: 09/01/24  I with HEP  Goal status: INITIAL  2.  Pt will demonstrate improved RUE function as evidenced by increasing box/ blocks score by 3 blocks for RUE. Baseline: 46 blocks Goal status: INITIAL  3.   Pt will report using her RUE at least 75% of the time for washing face and bushing  teeth Baseline: uses RUE 50% of the time  Goal status: INITIAL  4.  I with adapted strategies to maximize safety and I with ADLS/IADLs.  Goal status: INITIAL      LONG TERM GOALS: Target date: 10/24/24  I with updated HEP  Goal status: INITIAL  2.  Pt will demonstrate improved RUE functional use as evidenced by  improving UEFI to 70/80  Goal status: INITIAL  3.  Pt will  demonstrate improved RUE functional use as evidenced by decreasing RUE 9 hole peg test score by 3 secs.  Goal status: INITIAL  4.  Pt will verbalize understanding of ways to prevent future PD related complications   Goal status: INITIAL  5.  Pt will report increased ease with cooking, chopping vegetables  Goal status: INITIAL   ASSESSMENT:  CLINICAL IMPRESSION: Patient is a 69 y.o. female who was seen today for occupational therapy evaluation for Parkinson's disease. Pt is well know to therapist from pervious therapy. Pt presents with the perfromance deficits below. she can benefit from skilled occupational therapy to addess these deficits.  PERFORMANCE DEFICITS: in functional skills including ADLs, IADLs, coordination, dexterity, tone, ROM, strength, flexibility, Fine motor control, Gross motor control, mobility, balance, decreased knowledge of precautions, decreased knowledge of use of DME, and UE functional use, cognitive skills including emotional and energy/drive, and psychosocial skills including coping strategies, environmental adaptation, habits, interpersonal  interactions, and routines and behaviors.   IMPAIRMENTS: are limiting patient from ADLs, IADLs, rest and sleep, play, leisure, and social participation.   COMORBIDITIES:  does not have comorbitiies that affects occupational performance. Patient will benefit from skilled OT to address above impairments and improve overall function.  MODIFICATION OR ASSISTANCE TO COMPLETE EVALUATION: No modification of tasks or assist necessary to complete an evaluation.  OT OCCUPATIONAL PROFILE AND HISTORY: Detailed assessment: Review of records and additional review of physical, cognitive, psychosocial history related to current functional performance.  CLINICAL DECISION MAKING: LOW - limited treatment options, no task modification necessary  REHAB POTENTIAL: Good  EVALUATION COMPLEXITY: Low    PLAN:  OT FREQUENCY: 1x/week  OT DURATION: 12 weeksanticipate d/c after 5-8 visits  PLANNED INTERVENTIONS: 97168 OT Re-evaluation, 97535 self care/ADL training, 02889 therapeutic exercise, 97530 therapeutic activity, 97112 neuromuscular re-education, 97140 manual therapy, 97113 aquatic therapy, 97035 ultrasound, 97018 paraffin, 02989 moist heat, 97010 cryotherapy, 97750 Physical Performance Testing, 02239 Orthotic Initial, [id number] Orthotic/Prosthetic subsequent, passive range of motion, balance training, functional mobility training, energy conservation, coping strategies training, patient/family education, and DME and/or AE instructions  RECOMMENDED OTHER SERVICES: n/a  CONSULTED AND AGREED WITH PLAN OF CARE: Patient  PLAN FOR NEXT SESSION: coordination HEP   Danyle Boening, OT 08/01/2024, 12:08 PM   "

## 2024-08-02 ENCOUNTER — Encounter: Payer: Self-pay | Admitting: Occupational Therapy

## 2024-08-06 NOTE — Addendum Note (Signed)
 Addended by: Owyn Raulston B on: 08/06/2024 10:37 AM   Modules accepted: Orders

## 2024-08-15 ENCOUNTER — Ambulatory Visit: Admitting: Occupational Therapy

## 2024-08-15 ENCOUNTER — Encounter: Payer: Self-pay | Admitting: Occupational Therapy

## 2024-08-15 DIAGNOSIS — R278 Other lack of coordination: Secondary | ICD-10-CM

## 2024-08-15 DIAGNOSIS — R2689 Other abnormalities of gait and mobility: Secondary | ICD-10-CM

## 2024-08-15 DIAGNOSIS — R29818 Other symptoms and signs involving the nervous system: Secondary | ICD-10-CM

## 2024-08-15 DIAGNOSIS — R2681 Unsteadiness on feet: Secondary | ICD-10-CM

## 2024-08-15 DIAGNOSIS — M25621 Stiffness of right elbow, not elsewhere classified: Secondary | ICD-10-CM

## 2024-08-15 NOTE — Therapy (Signed)
 " OUTPATIENT OCCUPATIONAL THERAPY [facility] Treatment   Patient Name: Anita Schmidt MRN: [id number] DOB:[birth year], 69 y.o., female Today's Date: 08/15/2024  PCP: Dr. Pura REFERRING PROVIDER: Dr. Onita  END OF SESSION:  OT End of Session - 08/15/24 0804     Visit Number 2    Authorization Type [employer] MCR    Authorization Time Period 12 weeks    Authorization - Visit Number 2    OT Start Time 0804    OT Stop Time 0845    OT Time Calculation (min) 41 min          Past Medical History:  Diagnosis Date   Anxiety    severe   Anxiety disorder    Basal cell carcinoma 01/12/2011   BCC LEFT SIDE OF NOSE TX=MOHS   Fibrocystic breast disease    GERD (gastroesophageal reflux disease)    triggered with anxiety-not treated with meds   Gilbert's syndrome    Heart murmur    slight murmur dx by MD- no treatment-EKG clear   Hemochromatosis    High blood pressure    on meds   Hyperlipidemia    diet controlled   Skin cancer, basal cell    nose   Past Surgical History:  Procedure Laterality Date   BREAST BIOPSY Bilateral    COLONOSCOPY     colonscopy  2011   Tics ; Dr Jakie   DILATION AND CURETTAGE OF UTERUS     INGUINAL HERNIA REPAIR     MOHS SURGERY  2012   nose   TONSILLECTOMY     Tracer Pellets  2003   in breast; [facility]   WISDOM TOOTH EXTRACTION     Patient Active Problem List   Diagnosis Date Noted   Other lack of coordination 07/05/2024   Chronic left hip pain 02/09/2024   Hyperreflexia 05/31/2023   Parkinson's disease with dyskinesia, with fluctuations (HCC) 05/31/2023   Right leg weakness 01/07/2021   Weakness of right hip 01/07/2021   Affective disorder 10/18/2019   Abnormal EKG 07/03/2018   Infraspinatus strain 04/20/2018   It band syndrome, left 04/20/2018   Cough 10/27/2017   Wheezing 10/27/2017   Anxiety 10/27/2017   Hyperglycemia 04/07/2017   Insect bite 04/07/2017   Cardiac murmur 12/29/2016   Pes anserine bursitis 10/19/2016   Scapular  dyskinesis 10/19/2016   Preventative health care 03/11/2016   Generalized anxiety disorder 05/24/2013   Benign essential hypertension 05/23/2013   Diverticulosis of colon without hemorrhage 04/27/2013   Malignant basal cell neoplasm of skin 04/27/2013   Hemochromatosis    Bee sting allergy 03/26/2011   IBS 02/10/2010   Anxiety state 12/25/2008   DISC DISEASE, CERVICAL 11/19/2008   Allergic rhinitis 09/19/2008   Hyperlipidemia 08/01/2008   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   MENOPAUSAL SYNDROME 08/01/2008   Personal history of disease 08/01/2008    ONSET DATE: 07/05/25- referral date  REFERRING DIAG:  Diagnosis  G20.B2 (ICD-10-CM) - Parkinson's disease with dyskinesia, with fluctuations (HCC)  R27.8 (ICD-10-CM) - Other lack of coordination    THERAPY DIAG:  Other symptoms and signs involving the nervous system  Unsteadiness on feet  Stiffness of right elbow, not elsewhere classified  Other abnormalities of gait and mobility  Other lack of coordination  Rationale for Evaluation and Treatment: Rehabilitation  SUBJECTIVE:   SUBJECTIVE STATEMENT: Pt reports she thinks things are going better Pt accompanied by: self  PERTINENT HISTORY: Parkinsonism, recently diagnosed November 2024, however symptomatic with decreased arm swing for several years.  PMHx of HTN, ostoporosis, anxiety   PRECAUTIONS: None and Fall  WEIGHT BEARING RESTRICTIONS: No  PAIN:  Are you having pain? No, pt reports mild hip pain at times with external rotation, no pain if repositioned  FALLS: Has patient fallen in last 6 months? No  LIVING ENVIRONMENT: Lives with: lives with their family and lives alone Lives in: House/apartment   PLOF: Independent  PATIENT GOALS: To keep right hand from closing up  OBJECTIVE:  Note: Objective measures were completed at Evaluation unless otherwise noted.  HAND DOMINANCE: Right  ADLs:mod I with all basic ADLs.  Transfers/ambulation related to  ADLs:mod I Eating: mod I uses RUE 50%x Grooming: brushing teeth uses RUE 50% x UB Dressing: mod I LB Dressing: mod I Toileting: mod I Bathing: mod I Tub Shower transfers: mod I   IADLs:mod I with IADLs, difficulty chopping vegetables, peeling and cutting, difficulty opening a jar. Pt reports increased difficulty with heavioer housework and laundry, Pt rpeorts every thing is slower  Handwriting: Pt reports micrographia after writing for a while, pt wrote a sentence with good legibility and letter size.  MOBILITY STATUS: Independent, decreased R arm swing    FUNCTIONAL OUTCOME MEASURES: UEFI-66/80  Fastening/unfastening 3 buttons: 25.09 secs using primarily LUE  PPT#2( eating)-14.04 secs, PPT#4 don/ doff jacket 11.23 secs COORDINATION: From screen 07/04/24: 9-hole peg test:    RUE  31.81 sec bradykinesia noted       LUE  24.98 sec   Box & Blocks Test:   RUE  46 blocks       LUE  57 blocks   UE ROM:  Bilateral shoulder flexion WFLS, RUE elbow extension:-5, Pt maintains MP joints in flexion at rest, needs v.c for extension.    SENSATION: WFL  MUSCLE TONE: RUE: Mild and Rigidity  COGNITION: Overall cognitive status: Within functional limits for tasks assessed  OBSERVATIONS: Bradykinesia                                                                                                                    TREATMENT DATE: 08/15/24 PWR! basic 4 in standing , min-mod v.c for amplitude and repostioning LLE to avoid pain. PWR! hands basic 4, min-mod v.c for amplitude and positioning Suggestions for managing anxious feelings, flicks in lap, and toe scrunches when seated, march in place when standing Extension stretch at wall with chin tuck and scapular retraction, min v.c and demonstration Reviewed coordination HEP: flipping and dealing cards, stacking and manipulating pennies, rotating ball in hand, tossing ball between hands, min-mod v.c for amplitude and positioning  08/01/24-  eval, see pt education    PATIENT EDUCATION:coordination HEP, PWR! hands- pt has handout previously issued Education details: Person educated: Patient Education method: Explanation, demonstration, v.c Education comprehension: verbalized understanding, returned demonstration  HOME EXERCISE PROGRAM: n/a  GOALS: Goals reviewed with patient? Yes  SHORT TERM GOALS: Target date: 09/01/24  I with HEP  Goal status: ongoing, coordination reviewed, pt demonstrates understanding 08/15/24  2.  Pt will demonstrate improved  RUE function as evidenced by increasing box/ blocks score by 3 blocks for RUE. Baseline: 46 blocks Goal status:  ongoing 08/15/24  3.   Pt will report using her RUE at least 75% of the time for washing face and bushing teeth Baseline: uses RUE 50% of the time  Goal status: INITIAL  4.  I with adapted strategies to maximize safety and I with ADLS/IADLs.  Goal status: INITIAL      LONG TERM GOALS: Target date: 10/24/24  I with updated HEP  Goal status: INITIAL  2.  Pt will demonstrate improved RUE functional use as evidenced by  improving UEFI to 70/80  Goal status: INITIAL  3.  Pt will  demonstrate improved RUE functional use as evidenced by decreasing RUE 9 hole peg test score by 3 secs.  Goal status: INITIAL  4.  Pt will verbalize understanding of ways to prevent future PD related complications   Goal status: INITIAL  5.  Pt will report increased ease with cooking, chopping vegetables  Goal status: INITIAL   ASSESSMENT:  CLINICAL IMPRESSION: Patient is progressing towards goals. she demonstraes understanding of coordination HEP following review.  PERFORMANCE DEFICITS: in functional skills including ADLs, IADLs, coordination, dexterity, tone, ROM, strength, flexibility, Fine motor control, Gross motor control, mobility, balance, decreased knowledge of precautions, decreased knowledge of use of DME, and UE functional use, cognitive skills including  emotional and energy/drive, and psychosocial skills including coping strategies, environmental adaptation, habits, interpersonal interactions, and routines and behaviors.   IMPAIRMENTS: are limiting patient from ADLs, IADLs, rest and sleep, play, leisure, and social participation.   COMORBIDITIES:  does not have comorbitiies that affects occupational performance. Patient will benefit from skilled OT to address above impairments and improve overall function.  MODIFICATION OR ASSISTANCE TO COMPLETE EVALUATION: No modification of tasks or assist necessary to complete an evaluation.  OT OCCUPATIONAL PROFILE AND HISTORY: Detailed assessment: Review of records and additional review of physical, cognitive, psychosocial history related to current functional performance.  CLINICAL DECISION MAKING: LOW - limited treatment options, no task modification necessary  REHAB POTENTIAL: Good  EVALUATION COMPLEXITY: Low    PLAN:  OT FREQUENCY: 1x/week  OT DURATION: 12 weeksanticipate d/c after 5-8 visits  PLANNED INTERVENTIONS: 97168 OT Re-evaluation, 97535 self care/ADL training, 02889 therapeutic exercise, 97530 therapeutic activity, 97112 neuromuscular re-education, 97140 manual therapy, 97113 aquatic therapy, 97035 ultrasound, 97018 paraffin, 02989 moist heat, 97010 cryotherapy, 97750 Physical Performance Testing, 02239 Orthotic Initial, 97763 Orthotic/Prosthetic subsequent, passive range of motion, balance training, functional mobility training, energy conservation, coping strategies training, patient/family education, and DME and/or AE instructions  RECOMMENDED OTHER SERVICES: n/a  CONSULTED AND AGREED WITH PLAN OF CARE: Patient  PLAN FOR NEXT SESSION: functional activity with big movements, Quadraped PWR!   Breyden Jeudy, OT 08/15/2024, 8:06 AM   "

## 2024-08-22 ENCOUNTER — Ambulatory Visit: Admitting: Occupational Therapy

## 2024-08-22 ENCOUNTER — Encounter: Payer: Self-pay | Admitting: Occupational Therapy

## 2024-08-22 DIAGNOSIS — R2681 Unsteadiness on feet: Secondary | ICD-10-CM

## 2024-08-22 DIAGNOSIS — R278 Other lack of coordination: Secondary | ICD-10-CM

## 2024-08-22 DIAGNOSIS — R29818 Other symptoms and signs involving the nervous system: Secondary | ICD-10-CM

## 2024-08-22 DIAGNOSIS — M25621 Stiffness of right elbow, not elsewhere classified: Secondary | ICD-10-CM

## 2024-08-22 DIAGNOSIS — R2689 Other abnormalities of gait and mobility: Secondary | ICD-10-CM

## 2024-08-22 NOTE — Therapy (Signed)
 " OUTPATIENT OCCUPATIONAL THERAPY [facility] Treatment   Patient Name: Anita Schmidt MRN: [id number] DOB:27-Oct-1955, 69 y.o., female Today's Date: 08/22/2024  PCP: Dr. Pura REFERRING PROVIDER: Dr. Onita  END OF SESSION:  OT End of Session - 08/22/24 1535     Visit Number 3    Number of Visits 12    Date for Recertification  10/24/24    Authorization Type [employer] M[employer]    Authorization Time Period 12 weeks    Authorization - Visit Number 3    OT Start Time 0850    OT Stop Time 0930    OT Time Calculation (min) 40 min    Activity Tolerance Patient tolerated treatment well    Behavior During Therapy WFL for tasks assessed/performed           Past Medical History:  Diagnosis Date   Anxiety    severe   Anxiety disorder    Basal cell carcinoma 01/12/2011   BCC LEFT SIDE OF NOSE TX=MOHS   Fibrocystic breast disease    GERD (gastroesophageal reflux disease)    triggered with anxiety-not treated with meds   Gilbert's syndrome    Heart murmur    slight murmur dx by MD- no treatment-EKG clear   Hemochromatosis    High blood pressure    on meds   Hyperlipidemia    diet controlled   Skin cancer, basal cell    nose   Past Surgical History:  Procedure Laterality Date   BREAST BIOPSY Bilateral    COLONOSCOPY     colonscopy  2011   Tics ; Dr Jakie   DILATION AND CURETTAGE OF UTERUS     INGUINAL HERNIA REPAIR     MOHS SURGERY  2012   nose   TONSILLECTOMY     Tracer Pellets  2003   in breast; [facility]   WISDOM TOOTH EXTRACTION     Patient Active Problem List   Diagnosis Date Noted   Other lack of coordination 07/05/2024   Chronic left hip pain 02/09/2024   Hyperreflexia 05/31/2023   Parkinson's disease with dyskinesia, with fluctuations (HCC) 05/31/2023   Right leg weakness 01/07/2021   Weakness of right hip 01/07/2021   Affective disorder 10/18/2019   Abnormal EKG 07/03/2018   Infraspinatus strain 04/20/2018   It band syndrome, left 04/20/2018   Cough  10/27/2017   Wheezing 10/27/2017   Anxiety 10/27/2017   Hyperglycemia 04/07/2017   Insect bite 04/07/2017   Cardiac murmur 12/29/2016   Pes anserine bursitis 10/19/2016   Scapular dyskinesis 10/19/2016   Preventative health care 03/11/2016   Generalized anxiety disorder 05/24/2013   Benign essential hypertension 05/23/2013   Diverticulosis of colon without hemorrhage 04/27/2013   Malignant basal cell neoplasm of skin 04/27/2013   Hemochromatosis    Bee sting allergy 03/26/2011   IBS 02/10/2010   Anxiety state 12/25/2008   DISC DISEASE, CERVICAL 11/19/2008   Allergic rhinitis 09/19/2008   Hyperlipidemia 08/01/2008   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   MENOPAUSAL SYNDROME 08/01/2008   Personal history of disease 08/01/2008    ONSET DATE: 07/05/25- referral date  REFERRING DIAG:  Diagnosis  G20.B2 (ICD-10-CM) - Parkinson's disease with dyskinesia, with fluctuations (HCC)  R27.8 (ICD-10-CM) - Other lack of coordination    THERAPY DIAG:  Other symptoms and signs involving the nervous system  Unsteadiness on feet  Stiffness of right elbow, not elsewhere classified  Other abnormalities of gait and mobility  Other lack of coordination  Rationale for Evaluation and Treatment: Rehabilitation  SUBJECTIVE:   SUBJECTIVE STATEMENT: Pt reports [place] country sk[place] some Pt accompanied by: self  PERTINENT HISTORY: Parkinsonism, recently diagnosed November 2024, however symptomatic with decreased arm swing for several years. PMHx of HTN, ostoporosis, anxiety   PRECAUTIONS: None and Fall  WEIGHT BEARING RESTRICTIONS: No  PAIN:  Are you having pain? No, pt reports mild hip pain at times with external rotation, no pain if repositioned  FALLS: Has patient fallen in last 6 months? No  LIVING ENVIRONMENT: Lives with: lives with their family and lives alone Lives in: House/apartment   PLOF: Independent  PATIENT GOALS: To keep right hand from closing  up  OBJECTIVE:  Note: Objective measures were completed at Evaluation unless otherwise noted.  HAND DOMINANCE: Right  ADLs:mod I with all basic ADLs.  Transfers/ambulation related to ADLs:mod I Eating: mod I uses RUE 50%x Grooming: brushing teeth uses RUE 50% x UB Dressing: mod I LB Dressing: mod I Toileting: mod I Bathing: mod I Tub Shower transfers: mod I   IADLs:mod I with IADLs, difficulty chopping vegetables, peeling and cutting, difficulty opening a jar. Pt reports increased difficulty with heavioer housework and laundry, Pt rpeorts every thing is slower  Handwriting: Pt reports micrographia after writing for a while, pt wrote a sentence with good legibility and letter size.  MOBILITY STATUS: Independent, decreased R arm swing    FUNCTIONAL OUTCOME MEASURES: UEFI-66/80  Fastening/unfastening 3 buttons: 25.09 secs using primarily LUE  PPT#2( eating)-14.04 secs, PPT#4 don/ doff jacket 11.23 secs COORDINATION: From screen 07/04/24: 9-hole peg test:    RUE  31.81 sec bradykinesia noted       LUE  24.98 sec   Box & Blocks Test:   RUE  46 blocks       LUE  57 blocks   UE ROM:  Bilateral shoulder flexion WFLS, RUE elbow extension:-5, Pt maintains MP joints in flexion at rest, needs v.c for extension.    SENSATION: WFL  MUSCLE TONE: RUE: Mild and Rigidity  COGNITION: Overall cognitive status: Within functional limits for tasks assessed  OBSERVATIONS: Bradykinesia                                                                                                                    TREATMENT DATE:   08/22/24- PWR! up, rock and twist in quadraped for warm up, min v.c  Standing closed chain shoulder flexion with foam roll focusing on elbow extension, min v.c, mirrow uses. Crumpling bag with left and right UE's and simulated drying back with bilateral UE's, min v.c for amplitude for simulated ADLs. Rolling up yoga mat, 3 trials, pt was shown how to extend finger of right  hand and perform against legs. Folding towels with big movments and forced use of RUE, min v.c PWR! hands for PWR! up, twist and step, min v.c  Discussed use of kitchen shears for cutting food 08/15/24 PWR! basic 4 in standing , min-mod v.c for amplitude and repostioning LLE to avoid pain. PWR! hands basic 4, min-mod v.c  for amplitude and positioning Suggestions for managing anxious feelings, flicks in lap, and toe scrunches when seated, march in place when standing Extension stretch at wall with chin tuck and scapular retraction, min v.c and demonstration Reviewed coordination HEP: flipping and dealing cards, stacking and manipulating pennies, rotating ball in hand, tossing ball between hands, min-mod v.c for amplitude and positioning  08/01/24- eval, see pt education    PATIENT EDUCATION:coordination HEP, PWR! hands- pt has handout previously issued Education details: Person educated: Patient Education method: Explanation, demonstration, v.c Education comprehension: verbalized understanding, returned demonstration  HOME EXERCISE PROGRAM: n/a  GOALS: Goals reviewed with patient? Yes  SHORT TERM GOALS: Target date: 09/01/24  I with HEP  Goal status: ongoing, coordination reviewed, pt demonstrates understanding 08/15/24  2.  Pt will demonstrate improved RUE function as evidenced by increasing box/ blocks score by 3 blocks for RUE. Baseline: 46 blocks Goal status:  ongoing 08/22/24  3.   Pt will report using her RUE at least 75% of the time for washing face and bushing teeth Baseline: uses RUE 50% of the   Goal status: washes 50%, brushes 75% 08/22/24  4.  I with adapted strategies to maximize safety and I with ADLS/IADLs.  Goal status: ongoing , pt shown strategies for yoga mat, towels, cut food 08/22/24      LONG TERM GOALS: Target date: 10/24/24  I with updated HEP  Goal status: INITIAL  2.  Pt will demonstrate improved RUE functional use as evidenced by  improving UEFI to  70/80  Goal status: INITIAL  3.  Pt will  demonstrate improved RUE functional use as evidenced by decreasing RUE 9 hole peg test score by 3 secs.  Goal status: INITIAL  4.  Pt will verbalize understanding of ways to prevent future PD related complications   Goal status: INITIAL  5.  Pt will report increased ease with cooking, chopping vegetables  Goal status: INITIAL   ASSESSMENT:  CLINICAL IMPRESSION: Patient is progressing towards goals. She reports being more mindful of RUE use. She is responding well to v.c for big movments.  PERFORMANCE DEFICITS: in functional skills including ADLs, IADLs, coordination, dexterity, tone, ROM, strength, flexibility, Fine motor control, Gross motor control, mobility, balance, decreased knowledge of precautions, decreased knowledge of use of DME, and UE functional use, cognitive skills including emotional and energy/drive, and psychosocial skills including coping strategies, environmental adaptation, habits, interpersonal interactions, and routines and behaviors.   IMPAIRMENTS: are limiting patient from ADLs, IADLs, rest and sleep, play, leisure, and social participation.   COMORBIDITIES:  does not have comorbitiies that affects occupational performance. Patient will benefit from skilled OT to address above impairments and improve overall function.  MODIFICATION OR ASSISTANCE TO COMPLETE EVALUATION: No modification of tasks or assist necessary to complete an evaluation.  OT OCCUPATIONAL PROFILE AND HISTORY: Detailed assessment: Review of records and additional review of physical, cognitive, psychosocial history related to current functional performance.  CLINICAL DECISION MAKING: LOW - limited treatment options, no task modification necessary  REHAB POTENTIAL: Good  EVALUATION COMPLEXITY: Low    PLAN:  OT FREQUENCY: 1x/week  OT DURATION: 12 weeksanticipate d/c after 5-8 visits  PLANNED INTERVENTIONS: 97168 OT Re-evaluation, 97535 self  care/ADL training, 02889 therapeutic exercise, 97530 therapeutic activity, 97112 neuromuscular re-education, 97140 manual therapy, 97113 aquatic therapy, 97035 ultrasound, 97018 paraffin, 02989 moist heat, 97010 cryotherapy, 97750 Physical Performance Testing, 02239 Orthotic Initial, [id number] Orthotic/Prosthetic subsequent, passive range of motion, balance training, functional mobility training, energy conservation, coping strategies training, patient/family  education, and DME and/or AE instructions  RECOMMENDED OTHER SERVICES: n/a  CONSULTED AND AGREED WITH PLAN OF CARE: Patient  PLAN FOR NEXT SESSION: functional activity with big movements, ADL strategies   Meelah Tallo, OT 08/22/2024, 4:22 PM   "

## 2024-08-29 ENCOUNTER — Ambulatory Visit: Admitting: Occupational Therapy

## 2024-09-03 ENCOUNTER — Ambulatory Visit: Admitting: Sports Medicine

## 2024-09-05 ENCOUNTER — Ambulatory Visit: Admitting: Occupational Therapy

## 2024-12-03 ENCOUNTER — Ambulatory Visit: Admitting: Neurology

## 2024-12-27 ENCOUNTER — Other Ambulatory Visit
